# Patient Record
Sex: Male | Born: 1965 | Race: Black or African American | Hispanic: No | Marital: Single | Smoking: Never smoker
Health system: Southern US, Community
[De-identification: ages and names within clinical notes are randomized; demographics above are authoritative.]

## PROBLEM LIST (undated history)

## (undated) DIAGNOSIS — S43429A Sprain of unspecified rotator cuff capsule, initial encounter: Secondary | ICD-10-CM

## (undated) DIAGNOSIS — E119 Type 2 diabetes mellitus without complications: Secondary | ICD-10-CM

## (undated) DIAGNOSIS — I10 Essential (primary) hypertension: Secondary | ICD-10-CM

## (undated) HISTORY — DX: Type 2 diabetes mellitus without complications: E11.9

---

## 1999-02-23 ENCOUNTER — Encounter: Payer: Self-pay | Admitting: Emergency Medicine

## 1999-02-23 ENCOUNTER — Emergency Department (HOSPITAL_COMMUNITY): Admission: EM | Admit: 1999-02-23 | Discharge: 1999-02-23 | Payer: Self-pay | Admitting: Emergency Medicine

## 1999-04-02 ENCOUNTER — Ambulatory Visit (HOSPITAL_COMMUNITY): Admission: RE | Admit: 1999-04-02 | Discharge: 1999-04-02 | Payer: Self-pay | Admitting: Family Medicine

## 1999-04-02 ENCOUNTER — Encounter: Payer: Self-pay | Admitting: Family Medicine

## 2006-11-20 DIAGNOSIS — E669 Obesity, unspecified: Secondary | ICD-10-CM | POA: Insufficient documentation

## 2010-11-20 DIAGNOSIS — R079 Chest pain, unspecified: Secondary | ICD-10-CM

## 2010-11-21 DIAGNOSIS — R079 Chest pain, unspecified: Secondary | ICD-10-CM

## 2010-11-21 DIAGNOSIS — R9430 Abnormal result of cardiovascular function study, unspecified: Secondary | ICD-10-CM

## 2010-12-15 ENCOUNTER — Encounter: Payer: Self-pay | Admitting: Cardiovascular Disease

## 2010-12-29 ENCOUNTER — Encounter: Payer: Self-pay | Admitting: Cardiovascular Disease

## 2011-04-20 ENCOUNTER — Emergency Department (HOSPITAL_COMMUNITY)
Admission: EM | Admit: 2011-04-20 | Discharge: 2011-04-20 | Disposition: A | Discharge status: Home / Self Care | Payer: Self-pay | Attending: Emergency Medicine | Admitting: Emergency Medicine

## 2011-04-20 ENCOUNTER — Encounter: Payer: Self-pay | Admitting: Emergency Medicine

## 2011-04-20 DIAGNOSIS — W261XXA Contact with sword or dagger, initial encounter: Secondary | ICD-10-CM | POA: Insufficient documentation

## 2011-04-20 DIAGNOSIS — M79609 Pain in unspecified limb: Secondary | ICD-10-CM | POA: Insufficient documentation

## 2011-04-20 DIAGNOSIS — S61209A Unspecified open wound of unspecified finger without damage to nail, initial encounter: Secondary | ICD-10-CM | POA: Insufficient documentation

## 2011-04-20 DIAGNOSIS — S61218A Laceration without foreign body of other finger without damage to nail, initial encounter: Secondary | ICD-10-CM

## 2011-04-20 DIAGNOSIS — W260XXA Contact with knife, initial encounter: Secondary | ICD-10-CM | POA: Insufficient documentation

## 2011-04-20 HISTORY — DX: Essential (primary) hypertension: I10

## 2011-04-20 MED ORDER — IBUPROFEN 800 MG PO TABS: 800.0000 mg | ORAL_TABLET | Freq: Three times a day (TID) | ORAL | Status: AC

## 2011-04-20 MED ORDER — HYDROCODONE-ACETAMINOPHEN 5-325 MG PO TABS
1.0000 | ORAL_TABLET | Freq: Once | ORAL | Status: AC
  Administered 2011-04-20: 1 via ORAL
  Filled 2011-04-20: qty 1

## 2011-04-20 MED ORDER — TETANUS-DIPHTH-ACELL PERTUSSIS 5-2.5-18.5 LF-MCG/0.5 IM SUSP
0.5000 mL | Freq: Once | INTRAMUSCULAR | Status: AC
  Administered 2011-04-20: 0.5 mL via INTRAMUSCULAR

## 2011-04-20 NOTE — ED Provider Notes (Signed)
History     CSN: [id number]  Arrival date & time 04/20/11  7829   First MD Initiated Contact with Patient 04/20/11 1905      Chief Complaint  Patient presents with  . Laceration    (Consider location/radiation/quality/duration/timing/severity/associated sxs/prior treatment) HPI Comments: Pt was cutting onions and cut finger.  Patient is a 46 y.o. male presenting with skin laceration. The history is provided by the patient. No language interpreter was used.  Laceration  The incident occurred 1 to 2 hours ago. Pain location: L index finger. Size: 1.5. The laceration mechanism was a a clean knife. The pain is at a severity of 6/10. The pain has been constant since onset. He reports no foreign bodies present. His tetanus status is out of date.    Past Medical History  Diagnosis Date  . Hypertension     History reviewed. No pertinent past surgical history.  No family history on file.  History  Substance Use Topics  . Smoking status: Never Smoker   . Smokeless tobacco: Never Used  . Alcohol Use: No      Review of Systems  Skin: Positive for wound.  All other systems reviewed and are negative.    Allergies  Penicillins  Home Medications  No current outpatient prescriptions on file.  BP 153/108  Pulse 82  Temp(Src) 98.5 F (36.9 C) (Oral)  Resp 18  Ht 5\' 11"  (1.803 m)  Wt 315 lb (142.883 kg)  BMI 43.93 kg/m2  SpO2 99%  Physical Exam  Nursing note and vitals reviewed. Constitutional: He is oriented to person, place, and time. He appears well-developed and well-nourished.  HENT:  Head: Normocephalic and atraumatic.  Eyes: EOM are normal.  Neck: Normal range of motion.  Cardiovascular: Normal rate, regular rhythm, normal heart sounds and intact distal pulses.   Pulmonary/Chest: Effort normal and breath sounds normal. No respiratory distress.  Abdominal: Soft. He exhibits no distension. There is no tenderness.  Musculoskeletal: Normal range of motion. He  exhibits tenderness.       Left hand: He exhibits tenderness and laceration. He exhibits normal range of motion, no bony tenderness, normal capillary refill and no deformity. normal sensation noted. Normal strength noted.       Hands: Neurological: He is alert and oriented to person, place, and time.  Skin: Skin is warm and dry.  Psychiatric: He has a normal mood and affect. Judgment normal.    ED Course  LACERATION REPAIR Date/Time: 04/20/2011 7:35 PM Performed by: Worthy Rancher Authorized by: Worthy Rancher Consent: Verbal consent obtained. Written consent not obtained. Risks and benefits: risks, benefits and alternatives were discussed Consent given by: patient Patient understanding: patient states understanding of the procedure being performed Site marked: the operative site was not marked Imaging studies: imaging studies not available Patient identity confirmed: verbally with patient Time out: Immediately prior to procedure a "time out" was called to verify the correct patient, procedure, equipment, support staff and site/side marked as required. Body area: upper extremity Location details: left index finger Laceration length: 1.5 cm Foreign bodies: no foreign bodies Tendon involvement: none Nerve involvement: none Vascular damage: no Anesthesia method: none. Patient sedated: no Irrigation solution: tap water Irrigation method: tap Amount of cleaning: standard (drenched with sure clens) Skin closure: glue Approximation: close Approximation difficulty: simple Patient tolerance: Patient tolerated the procedure well with no immediate complications.   (including critical care time)  Labs Reviewed - No data to display No results found.   No diagnosis  found.    MDM          Worthy Rancher, PA 04/20/11 1943

## 2011-04-20 NOTE — ED Notes (Signed)
Patient states was cutting an onion and sliced his left index finger.

## 2011-04-20 NOTE — ED Provider Notes (Signed)
Medical screening examination/treatment/procedure(s) were performed by non-physician practitioner and as supervising physician I was immediately available for consultation/collaboration.  Momin Misko S. Engelbert Sevin, MD 04/20/11 2327 

## 2011-09-06 ENCOUNTER — Encounter (HOSPITAL_COMMUNITY): Payer: Self-pay | Admitting: *Deleted

## 2011-09-06 ENCOUNTER — Emergency Department (HOSPITAL_COMMUNITY)
Admission: EM | Admit: 2011-09-06 | Discharge: 2011-09-06 | Disposition: A | Discharge status: Home / Self Care | Payer: Self-pay | Attending: Emergency Medicine | Admitting: Emergency Medicine

## 2011-09-06 DIAGNOSIS — H02826 Cysts of left eye, unspecified eyelid: Secondary | ICD-10-CM

## 2011-09-06 DIAGNOSIS — I10 Essential (primary) hypertension: Secondary | ICD-10-CM | POA: Insufficient documentation

## 2011-09-06 DIAGNOSIS — H5710 Ocular pain, unspecified eye: Secondary | ICD-10-CM | POA: Insufficient documentation

## 2011-09-06 DIAGNOSIS — H02829 Cysts of unspecified eye, unspecified eyelid: Secondary | ICD-10-CM | POA: Insufficient documentation

## 2011-09-06 MED ORDER — ERYTHROMYCIN 5 MG/GM OP OINT
TOPICAL_OINTMENT | Freq: Once | OPHTHALMIC | Status: AC
  Administered 2011-09-06: 21:00:00 via OPHTHALMIC

## 2011-09-06 NOTE — ED Notes (Signed)
Pt presents with left eye pain x 2 years. Pain has increased over the past 2 days. Pt has a sty like papule on upper left eye lid. Pt reports had seen MD in [place] and was referred to opthalmologist. To which he has not followed through. Pt has noted swelling to upper left eye lid.,

## 2011-09-06 NOTE — ED Provider Notes (Signed)
History     CSN: [id number]  Arrival date & time 09/06/11  1941   First MD Initiated Contact with Patient 09/06/11 2006      Chief Complaint  Patient presents with  . Eye Pain    (Consider location/radiation/quality/duration/timing/severity/associated sxs/prior treatment) HPI Comments: [place] presents with left upper eyelid swelling and pain which has worsened over the past 4 days.  He describes a chronic "bump" on his left lid for the past 2 years which occasionally gets more swollen and tender,  Then improves.  He was encouraged by his former doctor in [place] to get seen by an ophthalmologist for definitive treatment,  But patient has not followed through.  He has since moved to [place].  He denies fevers and chills,  Denies visual changes,  No pain with movement of eyes.  He has tenderness with palpation of the eyelid.    The history is provided by the patient.    Past Medical History  Diagnosis Date  . Hypertension     History reviewed. No pertinent past surgical history.  History reviewed. No pertinent family history.  History  Substance Use Topics  . Smoking status: Never Smoker   . Smokeless tobacco: Never Used  . Alcohol Use: No      Review of Systems  Constitutional: Negative for fever.  HENT: Negative for ear pain, congestion, sore throat and neck pain.   Eyes: Positive for pain. Negative for photophobia, discharge and visual disturbance.  Gastrointestinal: Negative for nausea and abdominal pain.  Musculoskeletal: Negative for arthralgias.  Skin: Negative.  Negative for rash and wound.  Neurological: Negative for weakness, light-headedness and headaches.  Hematological: Negative.   Psychiatric/Behavioral: Negative.     Allergies  Penicillins  Home Medications   Current Outpatient Rx  Name Route Sig Dispense Refill  . ACETAMINOPHEN 500 MG PO TABS Oral Take 1,000 mg by mouth as needed. For pain    . AMLODIPINE BESYLATE 5 MG PO TABS Oral Take 5  mg by mouth daily.      . ASPIRIN EC 81 MG PO TBEC Oral Take 81 mg by mouth daily.    Marland Kitchen CARVEDILOL 12.5 MG PO TABS Oral Take 12.5 mg by mouth 2 (two) times daily.      Marland Kitchen KETOTIFEN FUMARATE 0.025 % OP SOLN Both Eyes Place 1 drop into both eyes 2 (two) times daily.    Marland Kitchen LISINOPRIL-HYDROCHLOROTHIAZIDE 20-12.5 MG PO TABS Oral Take 1 tablet by mouth daily.      . ADULT MULTIVITAMIN W/MINERALS CH Oral Take 1 tablet by mouth daily.        BP 169/106  Pulse 82  Temp(Src) 98.1 F (36.7 C) (Oral)  Resp 16  Ht 6' (1.829 m)  Wt 315 lb (142.883 kg)  BMI 42.72 kg/m2  SpO2 99%  Physical Exam  Constitutional: He appears well-developed and well-nourished. No distress.  HENT:  Head: Normocephalic.  Eyes: Conjunctivae and EOM are normal. Pupils are equal, round, and reactive to light. Right eye exhibits no discharge. Left eye exhibits no discharge.       Small punctum left upper eyelid margin with scant white center.  Upper lid with mild erythema and edema on outer 1/3.   Neck: Neck supple.  Cardiovascular: Normal rate.   Pulmonary/Chest: Effort normal. He has no wheezes.  Musculoskeletal: Normal range of motion. He exhibits no edema.    ED Course  Procedures (including critical care time)  Labs Reviewed - No data to display No results  found.   No diagnosis found.    MDM  Lesion which has been waxing and waning for the past 2 years without resolution greatly suggests sebaceous cyst rather than acute stye.  Patient has been instructed to wash his lid with baby shampoo twice daily,  Given erythromycin ophthalmic ointment to apply to the upper lid tid.  Warm compresses.  Referral to Dr Lita Mains for definitive tx.        Burgess Amor, [place] 09/07/11 [phone]

## 2011-09-06 NOTE — Discharge Instructions (Signed)
Apply the antibiotic ointment given three times daily after washing your eyelid with baby shampoo as discussed.  Use warm water wash cloth compresses for 10 minutes 4-5 times daily.  Call Dr. Lita Mains for definitive treatment of this eyelid problem,  As discussed.

## 2011-09-06 NOTE — ED Notes (Signed)
Pain lt eye with swelling of upper lid.

## 2011-09-07 NOTE — ED Provider Notes (Signed)
Medical screening examination/treatment/procedure(s) were performed by non-physician practitioner and as supervising physician I was immediately available for consultation/collaboration.   Matilde Pottenger, MD 09/07/11 1953 

## 2011-11-22 ENCOUNTER — Emergency Department (HOSPITAL_COMMUNITY): Payer: Self-pay

## 2011-11-22 ENCOUNTER — Emergency Department (HOSPITAL_COMMUNITY)
Admission: EM | Admit: 2011-11-22 | Discharge: 2011-11-22 | Disposition: A | Discharge status: Home / Self Care | Payer: Self-pay | Attending: Emergency Medicine | Admitting: Emergency Medicine

## 2011-11-22 ENCOUNTER — Encounter (HOSPITAL_COMMUNITY): Payer: Self-pay | Admitting: Emergency Medicine

## 2011-11-22 DIAGNOSIS — IMO0002 Reserved for concepts with insufficient information to code with codable children: Secondary | ICD-10-CM | POA: Insufficient documentation

## 2011-11-22 DIAGNOSIS — Z79899 Other long term (current) drug therapy: Secondary | ICD-10-CM | POA: Insufficient documentation

## 2011-11-22 DIAGNOSIS — Z880 Allergy status to penicillin: Secondary | ICD-10-CM | POA: Insufficient documentation

## 2011-11-22 DIAGNOSIS — S46912A Strain of unspecified muscle, fascia and tendon at shoulder and upper arm level, left arm, initial encounter: Secondary | ICD-10-CM

## 2011-11-22 DIAGNOSIS — I10 Essential (primary) hypertension: Secondary | ICD-10-CM | POA: Insufficient documentation

## 2011-11-22 HISTORY — DX: Sprain of unspecified rotator cuff capsule, initial encounter: S43.429A

## 2011-11-22 MED ORDER — HYDROCODONE-ACETAMINOPHEN 5-325 MG PO TABS
1.0000 | ORAL_TABLET | Freq: Once | ORAL | Status: AC
  Administered 2011-11-22: 1 via ORAL
  Filled 2011-11-22: qty 1

## 2011-11-22 MED ORDER — HYDROCODONE-ACETAMINOPHEN 5-325 MG PO TABS: 1.0000 | ORAL_TABLET | Freq: Four times a day (QID) | ORAL | Status: AC | PRN

## 2011-11-22 MED ORDER — IBUPROFEN 800 MG PO TABS
800.0000 mg | ORAL_TABLET | Freq: Once | ORAL | Status: AC
  Administered 2011-11-22: 800 mg via ORAL
  Filled 2011-11-22: qty 1

## 2011-11-22 NOTE — ED Provider Notes (Signed)
History     CSN: [id number]  Arrival date & time 11/22/11  1030   First MD Initiated Contact with Patient 11/22/11 1050      Chief Complaint  Patient presents with  . Shoulder Pain    (Consider location/radiation/quality/duration/timing/severity/associated sxs/prior treatment) HPI Comments: Pt lifted a recliner by himself and put it in a truck yesterday.  Heard a "pop" and has had pain since then.  R hand dominant.  Pain radiates into L arm.  Patient is a 46 y.o. male presenting with shoulder pain. The history is provided by the patient. No language interpreter was used.  Shoulder Pain This is a new problem. The current episode started yesterday. The problem occurs constantly. The problem has been unchanged. Associated symptoms include weakness. Pertinent negatives include no numbness. Exacerbated by: shoulder movement. He has tried nothing for the symptoms.    Past Medical History  Diagnosis Date  . Hypertension   . Rotator cuff (capsule) sprain     History reviewed. No pertinent past surgical history.  History reviewed. No pertinent family history.  History  Substance Use Topics  . Smoking status: Never Smoker   . Smokeless tobacco: Never Used  . Alcohol Use: No      Review of Systems  Musculoskeletal:       Shoulder injury  Neurological: Positive for weakness. Negative for numbness.  All other systems reviewed and are negative.    Allergies  Penicillins  Home Medications   Current Outpatient Rx  Name Route Sig Dispense Refill  . ACETAMINOPHEN 500 MG PO TABS Oral Take 1,000-1,500 mg by mouth as needed. For pain    . AMLODIPINE BESYLATE 5 MG PO TABS Oral Take 5 mg by mouth daily.      . ASPIRIN EC 81 MG PO TBEC Oral Take 81 mg by mouth daily.    Marland Kitchen CARVEDILOL 12.5 MG PO TABS Oral Take 12.5 mg by mouth daily.     Marland Kitchen LISINOPRIL-HYDROCHLOROTHIAZIDE 20-12.5 MG PO TABS Oral Take 1 tablet by mouth daily.      . ADULT MULTIVITAMIN W/MINERALS CH Oral Take 1 tablet  by mouth daily.      Marland Kitchen HYDROCODONE-ACETAMINOPHEN 5-325 MG PO TABS Oral Take 1 tablet by mouth every 6 (six) hours as needed for pain. 20 tablet 0    BP 166/92  Pulse 58  Temp 98.6 F (37 C)  Resp 17  SpO2 100%  Physical Exam  Nursing note and vitals reviewed. Constitutional: He is oriented to person, place, and time. He appears well-developed and well-nourished.  HENT:  Head: Normocephalic and atraumatic.  Eyes: EOM are normal.  Neck: Normal range of motion.  Cardiovascular: Normal rate, regular rhythm, normal heart sounds and intact distal pulses.   Pulmonary/Chest: Effort normal and breath sounds normal. No respiratory distress.  Abdominal: Soft. He exhibits no distension. There is no tenderness.  Musculoskeletal: He exhibits tenderness.       Left shoulder: He exhibits decreased range of motion, tenderness and pain. He exhibits no swelling, no crepitus, no deformity, no laceration and normal pulse.       Arms: Neurological: He is alert and oriented to person, place, and time.  Skin: Skin is warm and dry.  Psychiatric: He has a normal mood and affect. Judgment normal.    ED Course  Procedures (including critical care time)  Labs Reviewed - No data to display Dg Shoulder Left  11/22/2011  *RADIOLOGY REPORT*  Clinical Data: Shoulder pain  LEFT SHOULDER - 2+ VIEW  Comparison: None.  Findings: No acute fracture or dislocation is seen.  No soft tissue abnormality is noted.  IMPRESSION: No acute abnormality.  Original Report Authenticated By: Phillips Odor, M.D.     1. Left shoulder strain       MDM  No fx.  Ice, sling and ibuprofen rx-hydrocodone F/u with dr. Stana Bunting, PA 11/22/11 1153

## 2011-11-22 NOTE — ED Notes (Addendum)
Pt heard a pop to L shoulder lyesterday and pain since. Pt guarding are. ROM  limited due to pain. Was lifting at time of injury.

## 2011-11-23 NOTE — ED Provider Notes (Signed)
Medical screening examination/treatment/procedure(s) were performed by non-physician practitioner and as supervising physician I was immediately available for consultation/collaboration.  Donnetta Hutching, MD 11/23/11 [phone]

## 2012-10-20 ENCOUNTER — Ambulatory Visit: Payer: Self-pay | Admitting: Family Medicine

## 2012-10-22 ENCOUNTER — Ambulatory Visit (INDEPENDENT_AMBULATORY_CARE_PROVIDER_SITE_OTHER): Payer: Medicaid Other | Admitting: Family Medicine

## 2012-10-22 ENCOUNTER — Telehealth: Payer: Self-pay | Admitting: Family Medicine

## 2012-10-22 ENCOUNTER — Encounter: Payer: Self-pay | Admitting: Family Medicine

## 2012-10-22 VITALS — BP 158/93 | HR 79 | Temp 98.7°F | Ht 71.0 in | Wt 320.6 lb

## 2012-10-22 DIAGNOSIS — E119 Type 2 diabetes mellitus without complications: Secondary | ICD-10-CM | POA: Insufficient documentation

## 2012-10-22 DIAGNOSIS — G4710 Hypersomnia, unspecified: Secondary | ICD-10-CM

## 2012-10-22 DIAGNOSIS — E291 Testicular hypofunction: Secondary | ICD-10-CM

## 2012-10-22 DIAGNOSIS — I10 Essential (primary) hypertension: Secondary | ICD-10-CM | POA: Insufficient documentation

## 2012-10-22 DIAGNOSIS — G4733 Obstructive sleep apnea (adult) (pediatric): Secondary | ICD-10-CM | POA: Insufficient documentation

## 2012-10-22 DIAGNOSIS — R635 Abnormal weight gain: Secondary | ICD-10-CM

## 2012-10-22 DIAGNOSIS — N529 Male erectile dysfunction, unspecified: Secondary | ICD-10-CM

## 2012-10-22 LAB — POCT CBC
Granulocyte percent: 71 %G (ref 37–80)
HCT, POC: 42.9 % — AB (ref 43.5–53.7)
Hemoglobin: 15.2 g/dL (ref 14.1–18.1)
Lymph, poc: 1.6 (ref 0.6–3.4)
MCH, POC: 30.1 pg (ref 27–31.2)
MCHC: 35.4 g/dL (ref 31.8–35.4)
MCV: 84.9 fL (ref 80–97)
MPV: 7.7 fL (ref 0–99.8)
POC Granulocyte: 4.8 (ref 2–6.9)
POC LYMPH PERCENT: 24.4 %L (ref 10–50)
Platelet Count, POC: 235 10*3/uL (ref 142–424)
RBC: 5.1 M/uL (ref 4.69–6.13)
RDW, POC: 12.6 %
WBC: 6.7 10*3/uL (ref 4.6–10.2)

## 2012-10-22 MED ORDER — SILDENAFIL CITRATE 100 MG PO TABS: 50.0000 mg | ORAL_TABLET | Freq: Every day | ORAL | Status: DC | PRN

## 2012-10-22 MED ORDER — TESTOSTERONE CYPIONATE 200 MG/ML IM SOLN: 200.0000 mg | Freq: Once | INTRAMUSCULAR | Status: DC

## 2012-10-22 NOTE — Progress Notes (Signed)
  Subjective:    Patient ID: [place], male    DOB: 1965-12-29, 47 y.o.   MRN: [id number]  HPI This 47 y.o. male presents for evaluation of establishment and diabetes.  He has low testosterone  And he was seen at health dept.  He has hx of htn, obesity, hypogonadism, snoring and sleep apnea, And fatigue.  He has not had sleep study but has been told by his girlfriend that he quits breathing and that His snoring is really bad and they sleep apart.  He c/o chronic fatigue.  He c/o ED.  He has hx of DM and has been Started on metformin and his fsbs's are better.  He had labs a few months ago at health dept.   Review of Systems C/o fatigue, snoring, and ED.  No chest pain, SOB, HA, dizziness, vision change, N/V, diarrhea, constipation, dysuria, urinary urgency or frequency, myalgias, arthralgias or rash.     Objective:   Physical Exam Vital signs noted  Well developed well nourished obese male.  HEENT - Head atraumatic Normocephalic                Eyes - PERRLA, Conjuctiva - clear Sclera- Clear EOMI                Ears - EAC's Wnl TM's Wnl Gross Hearing WNL                Nose - Nares patent                 Throat - oropharanx wnl Respiratory - Lungs CTA bilateral Cardiac - RRR S1 and S2 without murmur GI - Abdomen soft Nontender and bowel sounds active x 4 Extremities - No edema. Neuro - Grossly intact.       Assessment & Plan:  Diabetes - Plan: testosterone cypionate (DEPOTESTOTERONE CYPIONATE) 200 MG/ML injection, POCT CBC, COMPLETE METABOLIC PANEL WITH GFR, Testosterone, Total & Free Direct, PSA.  Continue metformin 500mg  po bid and then get hgbaic in 3 months with follow up.  Hypogonadism male - Plan: testosterone cypionate (DEPOTESTOTERONE CYPIONATE) 200 MG/ML injection, POCT CBC, COMPLETE METABOLIC PANEL WITH GFR, Testosterone, Total & Free Direct, PSA.  Get old chart and stat testosterone monthly injections.  Hypersomnia with sleep apnea, unspecified - Plan:  Nocturnal polysomnography (NPSG)  Erectile dysfunction - Plan: sildenafil (VIAGRA) 100 MG tablet, cut in half and use one half to one prn.  Hypertension - Continue current antihypertensive medicine and discussed that getting OSAS tx will help with management of hypertension.  Obesity - Discussed that he needs to start aggressive weight loss program.

## 2012-10-22 NOTE — Patient Instructions (Signed)

## 2012-10-22 NOTE — Telephone Encounter (Signed)
[employer]CD will not cover Viagra is there something else we can call in ar something he can try over the counter

## 2012-10-23 LAB — COMPLETE METABOLIC PANEL WITH GFR
ALT: 37 U/L (ref 0–53)
AST: 32 U/L (ref 0–37)
Albumin: 4.4 g/dL (ref 3.5–5.2)
Alkaline Phosphatase: 57 U/L (ref 39–117)
BUN: 17 mg/dL (ref 6–23)
CO2: 28 mEq/L (ref 19–32)
Calcium: 9.3 mg/dL (ref 8.4–10.5)
Chloride: 100 mEq/L (ref 96–112)
Creat: 1.04 mg/dL (ref 0.50–1.35)
GFR, Est African American: >89 mL/min
GFR, Est Non African American: 86 mL/min
Glucose, Bld: 111 mg/dL — ABNORMAL HIGH (ref 70–99)
Potassium: 3.8 mEq/L (ref 3.5–5.3)
Sodium: 140 mEq/L (ref 135–145)
Total Bilirubin: 0.6 mg/dL (ref 0.3–1.2)
Total Protein: 7.3 g/dL (ref 6.0–8.3)

## 2012-10-23 LAB — TESTOSTERONE, TOTAL AND FREE DIRECT MEASURE
Free Testosterone, Direct: 6.8 pg/mL (ref 3.8–34.2)
Testosterone: 383 ng/dL (ref 300–890)

## 2012-10-23 LAB — PSA: PSA: 0.25 ng/mL (ref <=4.00)

## 2012-10-23 NOTE — Telephone Encounter (Signed)
Defer to lab result note that addresses this concern.

## 2012-10-23 NOTE — Telephone Encounter (Signed)
Left a message yesterday. States that medicaid will not cover viagra and wants to know if something different can be called in or if there is something OTC he can try

## 2012-10-24 NOTE — Telephone Encounter (Signed)
He wants to try the cialis can you please send to [facility] in [place]

## 2012-11-14 ENCOUNTER — Ambulatory Visit: Payer: Medicaid Other | Attending: Family Medicine

## 2012-11-14 DIAGNOSIS — G4730 Sleep apnea, unspecified: Secondary | ICD-10-CM

## 2012-11-14 DIAGNOSIS — G4733 Obstructive sleep apnea (adult) (pediatric): Secondary | ICD-10-CM | POA: Insufficient documentation

## 2012-11-17 NOTE — Procedures (Signed)
NAME:  [place], [place] NO.:  [id number]  MEDICAL RECORD NO.:  [id number]          PATIENT TYPE:  OUT  LOCATION:  SLEEP LAB                     FACILITY:  [facility]  PHYSICIAN:  Garreth Burnsworth A. Gerilyn Pilgrim, M.D. DATE OF BIRTH:  1965/06/14  DATE OF STUDY:  11/14/2012                           NOCTURNAL POLYSOMNOGRAM  REFERRING PHYSICIAN:  Anselm Pancoast [place]  INDICATION FOR STUDY:  A 47 year old who presents with fatigue and snoring.  The study is being done to evaluate for obstructive sleep apnea syndrome.  EPWORTH SLEEPINESS SCORE:  2.  MEDICATIONS:  Viagra, metformin, prednisone, Hyzaar, hydrocodone, and Norvasc.   ARCHITECTURAL SUMMARY:  The total recording time is 390 minutes, sleep efficiency 73%, sleep latency 74 minutes, REM latency 43 minutes.  Stage N1 8%, N2 70%, N3 3%, and REM sleep 19%.  RESPIRATORY SUMMARY:  Baseline oxygen saturation is 95, lowest saturation 87 during REM and non-REM sleep.  Diagnostic AHI is 21.  The patient had more events during REM sleep with the REM index of 28.  LIMB MOVEMENT SUMMARY:  PLM index is 0.  ELECTROCARDIOGRAM SUMMARY:  Average heart rate is 77, with infrequent PVCs observed.  IMPRESSION:  Moderate obstructive sleep apnea syndrome worse during REM sleep.  RECOMMENDATION:  Formal CPAP titration recording.  Thanks for this referral.   Traycen Goyer A. Gerilyn Pilgrim, M.D.    KAD/MEDQ  D:  11/17/2012 10:20:32  T:  11/17/2012 10:42:41  Job:  [id number]

## 2012-11-24 ENCOUNTER — Other Ambulatory Visit: Payer: Self-pay | Admitting: Family Medicine

## 2012-11-25 ENCOUNTER — Other Ambulatory Visit (HOSPITAL_COMMUNITY): Payer: Self-pay | Admitting: Orthopaedic Surgery

## 2012-11-25 DIAGNOSIS — M25561 Pain in right knee: Secondary | ICD-10-CM

## 2012-11-27 ENCOUNTER — Ambulatory Visit (HOSPITAL_COMMUNITY)
Admission: RE | Admit: 2012-11-27 | Discharge: 2012-11-27 | Disposition: A | Discharge status: Home / Self Care | Payer: Medicaid Other | Source: Ambulatory Visit | Attending: Orthopaedic Surgery | Admitting: Orthopaedic Surgery

## 2012-11-27 DIAGNOSIS — M25561 Pain in right knee: Secondary | ICD-10-CM

## 2012-11-27 DIAGNOSIS — M2240 Chondromalacia patellae, unspecified knee: Secondary | ICD-10-CM | POA: Insufficient documentation

## 2012-11-27 DIAGNOSIS — M25569 Pain in unspecified knee: Secondary | ICD-10-CM | POA: Insufficient documentation

## 2012-11-28 ENCOUNTER — Encounter: Payer: Self-pay | Admitting: Family Medicine

## 2012-12-10 ENCOUNTER — Other Ambulatory Visit: Payer: Self-pay | Admitting: Family Medicine

## 2012-12-10 MED ORDER — TADALAFIL 20 MG PO TABS: 10.0000 mg | ORAL_TABLET | ORAL | Status: DC | PRN

## 2013-01-01 ENCOUNTER — Other Ambulatory Visit (HOSPITAL_COMMUNITY): Payer: Self-pay

## 2013-01-01 DIAGNOSIS — G4730 Sleep apnea, unspecified: Secondary | ICD-10-CM

## 2013-02-05 ENCOUNTER — Ambulatory Visit: Payer: Medicaid Other | Attending: Neurology | Admitting: Sleep Medicine

## 2013-02-05 DIAGNOSIS — G4733 Obstructive sleep apnea (adult) (pediatric): Secondary | ICD-10-CM | POA: Insufficient documentation

## 2013-02-11 NOTE — Procedures (Signed)
[facility] Kery Haltiwanger A. Gerilyn Pilgrim, MD     [email or web address]        NAME:  [place], [place] NO.:  [id number]  MEDICAL RECORD NO.:  [id number]          PATIENT TYPE:  OUT  LOCATION:  SLEEP LAB                     FACILITY:  [facility]  PHYSICIAN:  De Jaworski A. Gerilyn Pilgrim, M.D. DATE OF BIRTH:  03-Aug-1965  DATE OF STUDY:  02/05/2013                           NOCTURNAL POLYSOMNOGRAM  REFERRING PHYSICIAN:  Tyhesha Dutson A. Gerilyn Pilgrim, M.D.   INDICATION:  The patient is a 47 years old who has a known history of obstructive sleep apnea syndrome documented with previous nocturnal polysomnography.   MEDICATIONS:  Amlodipine, metformin, Hyzaar, lovastatin, prednisone, hydrocodone.  EPWORTH SLEEPINESS SCALE:  2.  BMI:  44.  ARCHITECTURAL SUMMARY:  This is a titration recording study.  The total recording time is 368 minutes.  Sleep efficiency 90%, sleep latency 30 minutes.  REM latency 43 minutes.  Stage N1 3%, N2 56%, N3 0%, and REM sleep 42%.  RESPIRATORY SUMMARY:  Baseline oxygen saturation is 96, lowest saturation is 85 during REM sleep.  The patient was placed on positive pressure starting at 4 and increased to 15, did well any where from 13- 15.  The patient was also noted to have sleep-induced central apneas indicative of periodic breathing.  LIMB MOVEMENT SUMMARY:  PLM index 0.  ELECTROCARDIOGRAM SUMMARY:  Average heart rate is 80 with no significant dysrhythmias observed.  IMPRESSION:  Obstructive sleep apnea syndrome which responds well to a CPAP of 13.    Emsley Custer A. Gerilyn Pilgrim, M.D.    KAD/MEDQ  D:  02/11/2013 10:52:34  T:  02/11/2013 11:16:38  Job:  [id number]

## 2013-02-19 ENCOUNTER — Emergency Department (HOSPITAL_COMMUNITY)
Admission: EM | Admit: 2013-02-19 | Discharge: 2013-02-19 | Disposition: A | Discharge status: Home / Self Care | Payer: Medicaid Other | Attending: Emergency Medicine | Admitting: Emergency Medicine

## 2013-02-19 ENCOUNTER — Encounter (HOSPITAL_COMMUNITY): Payer: Self-pay | Admitting: Emergency Medicine

## 2013-02-19 ENCOUNTER — Other Ambulatory Visit: Payer: Self-pay

## 2013-02-19 DIAGNOSIS — M25551 Pain in right hip: Secondary | ICD-10-CM

## 2013-02-19 DIAGNOSIS — Z7982 Long term (current) use of aspirin: Secondary | ICD-10-CM | POA: Insufficient documentation

## 2013-02-19 DIAGNOSIS — M545 Low back pain, unspecified: Secondary | ICD-10-CM | POA: Insufficient documentation

## 2013-02-19 DIAGNOSIS — I10 Essential (primary) hypertension: Secondary | ICD-10-CM | POA: Insufficient documentation

## 2013-02-19 DIAGNOSIS — M25559 Pain in unspecified hip: Secondary | ICD-10-CM | POA: Insufficient documentation

## 2013-02-19 DIAGNOSIS — Z880 Allergy status to penicillin: Secondary | ICD-10-CM | POA: Insufficient documentation

## 2013-02-19 DIAGNOSIS — E119 Type 2 diabetes mellitus without complications: Secondary | ICD-10-CM | POA: Insufficient documentation

## 2013-02-19 DIAGNOSIS — Z87828 Personal history of other (healed) physical injury and trauma: Secondary | ICD-10-CM | POA: Insufficient documentation

## 2013-02-19 DIAGNOSIS — Z79899 Other long term (current) drug therapy: Secondary | ICD-10-CM | POA: Insufficient documentation

## 2013-02-19 MED ORDER — HYDROCODONE-ACETAMINOPHEN 5-325 MG PO TABS
2.0000 | ORAL_TABLET | Freq: Once | ORAL | Status: AC
  Administered 2013-02-19: 2 via ORAL
  Filled 2013-02-19: qty 2

## 2013-02-19 MED ORDER — NAPROXEN 250 MG PO TABS
500.0000 mg | ORAL_TABLET | Freq: Once | ORAL | Status: AC
  Administered 2013-02-19: 500 mg via ORAL
  Filled 2013-02-19: qty 2

## 2013-02-19 MED ORDER — DIAZEPAM 5 MG PO TABS
10.0000 mg | ORAL_TABLET | Freq: Once | ORAL | Status: AC
  Administered 2013-02-19: 10 mg via ORAL
  Filled 2013-02-19: qty 2

## 2013-02-19 MED ORDER — METHOCARBAMOL 500 MG PO TABS: 500.0000 mg | ORAL_TABLET | Freq: Three times a day (TID) | ORAL | Status: DC

## 2013-02-19 NOTE — ED Provider Notes (Signed)
CSN: [id number]     Arrival date & time 02/19/13  1442 History   First MD Initiated Contact with Patient 02/19/13 1528     Chief Complaint  Patient presents with  . Hip Pain   (Consider location/radiation/quality/duration/timing/severity/associated sxs/prior Treatment) Patient is a 47 y.o. male presenting with hip pain. The history is provided by the patient.  Hip Pain This is a new problem. The current episode started in the past 7 days. The problem has been unchanged. Associated symptoms include arthralgias. Pertinent negatives include no abdominal pain, chest pain, coughing or neck pain. The symptoms are aggravated by standing and walking. He has tried NSAIDs for the symptoms. The treatment provided no relief.    Past Medical History  Diagnosis Date  . Hypertension   . Rotator cuff (capsule) sprain   . Diabetes mellitus without complication    History reviewed. No pertinent past surgical history. Family History  Problem Relation Age of Onset  . Hypertension Mother   . Hyperlipidemia Mother   . Cancer Father     throat   History  Substance Use Topics  . Smoking status: Never Smoker   . Smokeless tobacco: Never Used  . Alcohol Use: No    Review of Systems  Constitutional: Negative for activity change.       All ROS Neg except as noted in HPI  HENT: Negative for nosebleeds.   Eyes: Negative for photophobia and discharge.  Respiratory: Negative for cough, shortness of breath and wheezing.   Cardiovascular: Negative for chest pain and palpitations.  Gastrointestinal: Negative for abdominal pain and blood in stool.  Genitourinary: Negative for dysuria, frequency and hematuria.  Musculoskeletal: Positive for arthralgias. Negative for back pain and neck pain.  Skin: Negative.   Neurological: Negative for dizziness, seizures and speech difficulty.  Psychiatric/Behavioral: Negative for hallucinations and confusion.    Allergies  Penicillins  Home Medications   Current  Outpatient Rx  Name  Route  Sig  Dispense  Refill  . amLODipine (NORVASC) 10 MG tablet   Oral   Take 10 mg by mouth daily.         Marland Kitchen aspirin EC 81 MG tablet   Oral   Take 81 mg by mouth daily.         Marland Kitchen HYDROcodone-ibuprofen (VICOPROFEN) 7.5-200 MG per tablet   Oral   Take 1 tablet by mouth every 8 (eight) hours as needed for pain.         Marland Kitchen losartan-hydrochlorothiazide (HYZAAR) 100-25 MG per tablet   Oral   Take 1 tablet by mouth daily.         Marland Kitchen lovastatin (MEVACOR) 20 MG tablet   Oral   Take 20 mg by mouth at bedtime.         . metformin (FORTAMET) 500 MG (OSM) 24 hr tablet   Oral   Take 500 mg by mouth daily with breakfast.         . naproxen (NAPROSYN) 500 MG tablet   Oral   Take 500 mg by mouth 2 (two) times daily with a meal.         . sildenafil (VIAGRA) 100 MG tablet   Oral   Take 0.5-1 tablets (50-100 mg total) by mouth daily as needed for erectile dysfunction.   5 tablet   11   . tadalafil (CIALIS) 20 MG tablet   Oral   Take 0.5-1 tablets (10-20 mg total) by mouth every other day as needed for erectile dysfunction.  5 tablet   11   . testosterone cypionate (DEPOTESTOTERONE CYPIONATE) 200 MG/ML injection   Intramuscular   Inject 1 mL (200 mg total) into the muscle once.   1 mL   3    BP 165/104  Pulse 99  Temp(Src) 98.8 F (37.1 C) (Oral)  Resp 20  Ht 6' (1.829 m)  Wt 325 lb (147.419 kg)  BMI 44.07 kg/m2  SpO2 98% Physical Exam  Nursing note and vitals reviewed. Constitutional: He is oriented to person, place, and time. He appears well-developed and well-nourished.  Non-toxic appearance.  HENT:  Head: Normocephalic.  Right Ear: Tympanic membrane and external ear normal.  Left Ear: Tympanic membrane and external ear normal.  Eyes: EOM and lids are normal. Pupils are equal, round, and reactive to light.  Neck: Normal range of motion. Neck supple. Carotid bruit is not present.  Cardiovascular: Normal rate, regular rhythm, normal  heart sounds, intact distal pulses and normal pulses.   Pulmonary/Chest: Breath sounds normal. No respiratory distress.  Abdominal: Soft. Bowel sounds are normal. There is no tenderness. There is no guarding.  Musculoskeletal: Normal range of motion.  Mild low back pain to palpation. Hip and thigh on the right to movement and with ROM. Distal pulses and sensory wnl. Neg Homan's sign. No hot areas.   Lymphadenopathy:       Head (right side): No submandibular adenopathy present.       Head (left side): No submandibular adenopathy present.    He has no cervical adenopathy.  Neurological: He is alert and oriented to person, place, and time. He has normal strength. No cranial nerve deficit or sensory deficit.  Skin: Skin is warm and dry.  Psychiatric: He has a normal mood and affect. His speech is normal.    ED Course  Procedures (including critical care time) Labs Review Labs Reviewed - No data to display Imaging Review No results found.  EKG Interpretation   None       MDM  No diagnosis found. *I have reviewed nursing notes, vital signs, and all appropriate lab and imaging results for this patient.**  Pt has been diagnosed with degenerative changes of the right knee in the past. Patient now has advancing pain of the right hip. The patient is scheduled to have an MRI of the hip sone. Patient advised to speak with his physician about possible MRI of the lower back as well. It is of note that the patient has had some" disc disease changes" in the past.  Plan at this time is for the patient to add Robaxin to his hydrocodone 7.5 and his Naprosyn. Patient advised to rest his back is much as possible. Patient's work note given to return on Monday, November 10.  Kathie Dike, PA-C 02/19/13 [phone]

## 2013-02-19 NOTE — ED Notes (Signed)
Pain rt hip for 3 days.No injury

## 2013-02-20 NOTE — ED Provider Notes (Signed)
No att. providers found  Medical screening examination/treatment/procedure(s) were performed by non-physician practitioner and as supervising physician I was immediately available for consultation/collaboration.  EKG Interpretation   None        Shelda Jakes, MD 02/20/13 [phone]

## 2013-12-01 ENCOUNTER — Other Ambulatory Visit (HOSPITAL_COMMUNITY): Payer: Self-pay | Admitting: Orthopaedic Surgery

## 2013-12-01 DIAGNOSIS — M722 Plantar fascial fibromatosis: Secondary | ICD-10-CM

## 2013-12-04 ENCOUNTER — Ambulatory Visit (HOSPITAL_COMMUNITY)
Admission: RE | Admit: 2013-12-04 | Discharge: 2013-12-04 | Disposition: A | Discharge status: Home / Self Care | Payer: Medicaid Other | Source: Ambulatory Visit | Attending: Orthopaedic Surgery | Admitting: Orthopaedic Surgery

## 2013-12-04 DIAGNOSIS — M76829 Posterior tibial tendinitis, unspecified leg: Secondary | ICD-10-CM | POA: Insufficient documentation

## 2013-12-04 DIAGNOSIS — R937 Abnormal findings on diagnostic imaging of other parts of musculoskeletal system: Secondary | ICD-10-CM | POA: Diagnosis not present

## 2013-12-04 DIAGNOSIS — E119 Type 2 diabetes mellitus without complications: Secondary | ICD-10-CM | POA: Diagnosis not present

## 2013-12-04 DIAGNOSIS — M79609 Pain in unspecified limb: Secondary | ICD-10-CM | POA: Insufficient documentation

## 2013-12-04 DIAGNOSIS — M722 Plantar fascial fibromatosis: Secondary | ICD-10-CM

## 2013-12-24 ENCOUNTER — Ambulatory Visit: Payer: Medicaid Other | Admitting: Podiatry

## 2014-01-06 ENCOUNTER — Encounter: Payer: Self-pay | Admitting: Podiatry

## 2014-01-06 ENCOUNTER — Ambulatory Visit (INDEPENDENT_AMBULATORY_CARE_PROVIDER_SITE_OTHER): Payer: Medicaid Other | Admitting: Podiatry

## 2014-01-06 VITALS — BP 170/114 | HR 68 | Resp 16 | Ht 71.0 in | Wt 325.0 lb

## 2014-01-06 DIAGNOSIS — M76829 Posterior tibial tendinitis, unspecified leg: Secondary | ICD-10-CM

## 2014-01-06 DIAGNOSIS — M76821 Posterior tibial tendinitis, right leg: Secondary | ICD-10-CM

## 2014-01-06 DIAGNOSIS — M722 Plantar fascial fibromatosis: Secondary | ICD-10-CM | POA: Diagnosis not present

## 2014-01-06 MED ORDER — TRIAMCINOLONE ACETONIDE 10 MG/ML IJ SUSP
10.0000 mg | Freq: Once | INTRAMUSCULAR | Status: DC
Start: 2014-01-06 — End: 2020-03-17

## 2014-01-06 NOTE — Patient Instructions (Signed)

## 2014-01-06 NOTE — Progress Notes (Signed)
   Subjective:    Patient ID: David Brooks, male    DOB: Jul 09, 1965, 48 y.o.   MRN: [id number]  HPI Comments: "I have this pain in the center of the heel"  Patient c/o sharp, intense pain plantar heel right for about 1 year. He does have AM pain and after resting for long periods. Walking a lot aggravates the symptoms as well as after periods of working. 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 He has been seeing Dr. Hilda Lias, an orthopedist who has been treating him for the foot/knee/hip. Previously he was recommended arch supports, stretching, naproxen, hydrocodone, cortisone injections, and prednisone pack. While on the prednisone he had relief of symptoms but since discontinuing the symptoms have returned. He states that he had 2 injections in his hip which seem to help his foot. He has also had 1 injection into his foot a few months ago. He states he had some relief of symptoms after the injection and his foot. He denies any history, or injury to the area. He previously has had 2 MRIs of his foot. He was referred here for further treatment.  Foot Pain Associated symptoms include arthralgias and headaches.      Review of Systems  Musculoskeletal: Positive for arthralgias.  Neurological: Positive for headaches.  All other systems reviewed and are negative.      Objective:   Physical Exam AAO x3, NAD DP/PT pulses palpable 2/4 b/l. CRT < 3sec Protective sensation intact with Dorann Ou monofilament, vibratory sensation intact, Achilles tendon reflex intact. Negative Tinel sign. Tenderness to palpation over the plantar medial tubercle of the right foot at the insertion of the plantar fascia. No pain with lateral compression of the calcaneus or along the posterior aspect. Mild equinus. No pain along the course of the plantar fascia within the arch of the foot. Mild tenderness at that navicular tuberosity at the distal aspect of the posterior  tibial tendon. Able to perform single and double heel rise without difficulty. MMT 5/5, ROM.    Previous MRI from 12/04/13 reviewed which revealedno abscess, osteomyelitis, or well-defined ulceration along the hindfoot; potentially low-grade plantar fasciitis; mild distal tibialis posterior tendinopathy.       Assessment & Plan:  48 year old male with likely plantar fasciitis right foot; insertional posterior tibial tendinitis  -Review his MRI reviewed. -Conservative versus surgical treatment were discussed including alternatives, risks, complications. -Patient elects to proceed with steroid injection into the right heel. Under sterile skin preparation, a total of 2.5cc of kenalog 10, 0.5% Marcaine plain, and 2% lidocaine plain were infiltrated into the symptomatic area without complication. A band-aid was applied. Patient tolerated the injection well without complication.  -Applied plantar fascial strapping. -Ice to the effected area -Stretching exercises discussed. -Continue anti-inflammatories. Side effects such as GI upset were discussed and was informed to stop taking if any are to occur.  -Importance of supportive shoe gear discussed. Also discussed orthotic therapy to help control his foot type. -Followup in 3 weeks or sooner if any palms are to arise or any changes symptoms. In the meantime call any questions, concerns.

## 2014-01-27 ENCOUNTER — Ambulatory Visit: Payer: Medicaid Other | Admitting: Podiatry

## 2014-01-29 ENCOUNTER — Encounter: Payer: Self-pay | Admitting: Podiatry

## 2014-02-10 ENCOUNTER — Ambulatory Visit (INDEPENDENT_AMBULATORY_CARE_PROVIDER_SITE_OTHER): Payer: Medicaid Other | Admitting: Podiatry

## 2014-02-10 ENCOUNTER — Encounter: Payer: Self-pay | Admitting: Podiatry

## 2014-02-10 VITALS — BP 163/84 | HR 77 | Resp 15

## 2014-02-10 DIAGNOSIS — M722 Plantar fascial fibromatosis: Secondary | ICD-10-CM

## 2014-02-10 NOTE — Progress Notes (Signed)
Patient ID: David Brooks, male   DOB: Dec 11, 1965, 48 y.o.   MRN: [id number]  Subjective: David Brooks returns to the office today for follow-up evaluation of right foot plantar fasciitis and posterior tibial tendinitis. He states that his pain has improved since last appointment. He's been continuing to stretch and exercises as well as icing. He is unable to purchase over-the-counter inserts. She said the injection has helped. He does state that typically when he has this discomfort he gets an injection and it typically resolves in approximately 2 months. He has purchased a night splint a couple days ago. He denies any acute changes since last appointment. No other complaints at this time. Denies any recent injury or trauma.  Objective: AAO x3, NAD DP/PT pulses palpable bilaterally, CRT less than 3 seconds Protective sensation intact with Simms Weinstein monofilament, vibratory sensation intact, Achilles tendon reflex intact Mild tenderness to palpation over the plantar medial tubercle of the calcaneus at insertion of the plantar fascia on the right foot. No pain with lateral compression of the calcaneus and no pain along the posterior aspect or along the course of the Achilles tendon. No pain along the course of the plantar fascia with the arch of the foot and the plantar fascia appears intact. No tenderness along the course of the posterior tibial tendon nor along the insertion of the navicular. MMT 5/5, ROM WNL No calf pain, swelling, warmth, erythema  Assessment: 48 year old male with resolving right foot plantar fasciitis and posterior tibial tendinitis.  Plan: -Conservative versus surgical treatment discussed including alternatives, risks, complications. -This time patient wishes to hold off on any further steroid injection. If an appointment symptoms persist he will consider another one. -Continue ice to the affected area. -Continue stretching. -Continue shoe gear modifications. -Continue  night splint. -Discussed a plantar fascial brace. I gave him the name of one to look online.  -Follow-up in 4 weeks or sooner if any problems are to arise or any change in symptoms. In the meantime, call with any questions, concerns.

## 2014-02-10 NOTE — Patient Instructions (Signed)
Plantar Fasciitis (Heel Spur Syndrome) with Rehab The plantar fascia is a fibrous, ligament-like, soft-tissue structure that spans the bottom of the foot. Plantar fasciitis is a condition that causes pain in the foot due to inflammation of the tissue. SYMPTOMS   Pain and tenderness on the underneath side of the foot.  Pain that worsens with standing or walking. CAUSES  Plantar fasciitis is caused by irritation and injury to the plantar fascia on the underneath side of the foot. Common mechanisms of injury include:  Direct trauma to bottom of the foot.  Damage to a small nerve that runs under the foot where the main fascia attaches to the heel bone.  Stress placed on the plantar fascia due to bone spurs. RISK INCREASES WITH:   Activities that place stress on the plantar fascia (running, jumping, pivoting, or cutting).  Poor strength and flexibility.  Improperly fitted shoes.  Tight calf muscles.  Flat feet.  Failure to warm-up properly before activity.  Obesity. PREVENTION  Warm up and stretch properly before activity.  Allow for adequate recovery between workouts.  Maintain physical fitness:  Strength, flexibility, and endurance.  Cardiovascular fitness.  Maintain a health body weight.  Avoid stress on the plantar fascia.  Wear properly fitted shoes, including arch supports for individuals who have flat feet. PROGNOSIS  If treated properly, then the symptoms of plantar fasciitis usually resolve without surgery. However, occasionally surgery is necessary. RELATED COMPLICATIONS   Recurrent symptoms that may result in a chronic condition.  Problems of the lower back that are caused by compensating for the injury, such as limping.  Pain or weakness of the foot during push-off following surgery.  Chronic inflammation, scarring, and partial or complete fascia tear, occurring more often from repeated injections. TREATMENT  Treatment initially involves the use of  ice and medication to help reduce pain and inflammation. The use of strengthening and stretching exercises may help reduce pain with activity, especially stretches of the Achilles tendon. These exercises may be performed at home or with a therapist. Your caregiver may recommend that you use heel cups of arch supports to help reduce stress on the plantar fascia. Occasionally, corticosteroid injections are given to reduce inflammation. If symptoms persist for greater than 6 months despite non-surgical (conservative), then surgery may be recommended.  MEDICATION   If pain medication is necessary, then nonsteroidal anti-inflammatory medications, such as aspirin and ibuprofen, or other minor pain relievers, such as acetaminophen, are often recommended.  Do not take pain medication within 7 days before surgery.  Prescription pain relievers may be given if deemed necessary by your caregiver. Use only as directed and only as much as you need.  Corticosteroid injections may be given by your caregiver. These injections should be reserved for the most serious cases, because they may only be given a certain number of times. HEAT AND COLD  Cold treatment (icing) relieves pain and reduces inflammation. Cold treatment should be applied for 10 to 15 minutes every 2 to 3 hours for inflammation and pain and immediately after any activity that aggravates your symptoms. Use ice packs or massage the area with a piece of ice (ice massage).  Heat treatment may be used prior to performing the stretching and strengthening activities prescribed by your caregiver, physical therapist, or athletic trainer. Use a heat pack or soak the injury in warm water. SEEK IMMEDIATE MEDICAL CARE IF:  Treatment seems to offer no benefit, or the condition worsens.  Any medications produce adverse side effects. EXERCISES RANGE   OF MOTION (ROM) AND STRETCHING EXERCISES - Plantar Fasciitis (Heel Spur Syndrome) These exercises may help you  when beginning to rehabilitate your injury. Your symptoms may resolve with or without further involvement from your physician, physical therapist or athletic trainer. While completing these exercises, remember:   Restoring tissue flexibility helps normal motion to return to the joints. This allows healthier, less painful movement and activity.  An effective stretch should be held for at least 30 seconds.  A stretch should never be painful. You should only feel a gentle lengthening or release in the stretched tissue. RANGE OF MOTION - Toe Extension, Flexion  Sit with your right / left leg crossed over your opposite knee.  Grasp your toes and gently pull them back toward the top of your foot. You should feel a stretch on the bottom of your toes and/or foot.  Hold this stretch for __________ seconds.  Now, gently pull your toes toward the bottom of your foot. You should feel a stretch on the top of your toes and or foot.  Hold this stretch for __________ seconds. Repeat __________ times. Complete this stretch __________ times per day.  RANGE OF MOTION - Ankle Dorsiflexion, Active Assisted  Remove shoes and sit on a chair that is preferably not on a carpeted surface.  Place right / left foot under knee. Extend your opposite leg for support.  Keeping your heel down, slide your right / left foot back toward the chair until you feel a stretch at your ankle or calf. If you do not feel a stretch, slide your bottom forward to the edge of the chair, while still keeping your heel down.  Hold this stretch for __________ seconds. Repeat __________ times. Complete this stretch __________ times per day.  STRETCH - Gastroc, Standing  Place hands on wall.  Extend right / left leg, keeping the front knee somewhat bent.  Slightly point your toes inward on your back foot.  Keeping your right / left heel on the floor and your knee straight, shift your weight toward the wall, not allowing your back to  arch.  You should feel a gentle stretch in the right / left calf. Hold this position for __________ seconds. Repeat __________ times. Complete this stretch __________ times per day. STRETCH - Soleus, Standing  Place hands on wall.  Extend right / left leg, keeping the other knee somewhat bent.  Slightly point your toes inward on your back foot.  Keep your right / left heel on the floor, bend your back knee, and slightly shift your weight over the back leg so that you feel a gentle stretch deep in your back calf.  Hold this position for __________ seconds. Repeat __________ times. Complete this stretch __________ times per day. STRETCH - Gastrocsoleus, Standing  Note: This exercise can place a lot of stress on your foot and ankle. Please complete this exercise only if specifically instructed by your caregiver.   Place the ball of your right / left foot on a step, keeping your other foot firmly on the same step.  Hold on to the wall or a rail for balance.  Slowly lift your other foot, allowing your body weight to press your heel down over the edge of the step.  You should feel a stretch in your right / left calf.  Hold this position for __________ seconds.  Repeat this exercise with a slight bend in your right / left knee. Repeat __________ times. Complete this stretch __________ times per day.    STRENGTHENING EXERCISES - Plantar Fasciitis (Heel Spur Syndrome)  These exercises may help you when beginning to rehabilitate your injury. They may resolve your symptoms with or without further involvement from your physician, physical therapist or athletic trainer. While completing these exercises, remember:   Muscles can gain both the endurance and the strength needed for everyday activities through controlled exercises.  Complete these exercises as instructed by your physician, physical therapist or athletic trainer. Progress the resistance and repetitions only as guided. STRENGTH -  Towel Curls  Sit in a chair positioned on a non-carpeted surface.  Place your foot on a towel, keeping your heel on the floor.  Pull the towel toward your heel by only curling your toes. Keep your heel on the floor.  If instructed by your physician, physical therapist or athletic trainer, add ____________________ at the end of the towel. Repeat __________ times. Complete this exercise __________ times per day. STRENGTH - Ankle Inversion  Secure one end of a rubber exercise band/tubing to a fixed object (table, pole). Loop the other end around your foot just before your toes.  Place your fists between your knees. This will focus your strengthening at your ankle.  Slowly, pull your big toe up and in, making sure the band/tubing is positioned to resist the entire motion.  Hold this position for __________ seconds.  Have your muscles resist the band/tubing as it slowly pulls your foot back to the starting position. Repeat __________ times. Complete this exercises __________ times per day.  Document Released: 04/02/2005 Document Revised: 06/25/2011 Document Reviewed: 07/15/2008 [employer] Patient Information 2015 [employer]. This information is not intended to replace advice given to you by your health care provider. Make sure you discuss any questions you have with your health care provider.  

## 2014-03-10 ENCOUNTER — Ambulatory Visit (INDEPENDENT_AMBULATORY_CARE_PROVIDER_SITE_OTHER): Payer: Medicaid Other | Admitting: Podiatry

## 2014-03-10 ENCOUNTER — Encounter: Payer: Self-pay | Admitting: Podiatry

## 2014-03-10 VITALS — BP 167/115 | HR 73 | Resp 18

## 2014-03-10 DIAGNOSIS — M722 Plantar fascial fibromatosis: Secondary | ICD-10-CM

## 2014-03-10 DIAGNOSIS — M7661 Achilles tendinitis, right leg: Secondary | ICD-10-CM

## 2014-03-10 NOTE — Patient Instructions (Signed)
Plantar Fasciitis (Heel Spur Syndrome) with Rehab The plantar fascia is a fibrous, ligament-like, soft-tissue structure that spans the bottom of the foot. Plantar fasciitis is a condition that causes pain in the foot due to inflammation of the tissue. SYMPTOMS   Pain and tenderness on the underneath side of the foot.  Pain that worsens with standing or walking. CAUSES  Plantar fasciitis is caused by irritation and injury to the plantar fascia on the underneath side of the foot. Common mechanisms of injury include:  Direct trauma to bottom of the foot.  Damage to a small nerve that runs under the foot where the main fascia attaches to the heel bone.  Stress placed on the plantar fascia due to bone spurs. RISK INCREASES WITH:   Activities that place stress on the plantar fascia (running, jumping, pivoting, or cutting).  Poor strength and flexibility.  Improperly fitted shoes.  Tight calf muscles.  Flat feet.  Failure to warm-up properly before activity.  Obesity. PREVENTION  Warm up and stretch properly before activity.  Allow for adequate recovery between workouts.  Maintain physical fitness:  Strength, flexibility, and endurance.  Cardiovascular fitness.  Maintain a health body weight.  Avoid stress on the plantar fascia.  Wear properly fitted shoes, including arch supports for individuals who have flat feet. PROGNOSIS  If treated properly, then the symptoms of plantar fasciitis usually resolve without surgery. However, occasionally surgery is necessary. RELATED COMPLICATIONS   Recurrent symptoms that may result in a chronic condition.  Problems of the lower back that are caused by compensating for the injury, such as limping.  Pain or weakness of the foot during push-off following surgery.  Chronic inflammation, scarring, and partial or complete fascia tear, occurring more often from repeated injections. TREATMENT  Treatment initially involves the use of  ice and medication to help reduce pain and inflammation. The use of strengthening and stretching exercises may help reduce pain with activity, especially stretches of the Achilles tendon. These exercises may be performed at home or with a therapist. Your caregiver may recommend that you use heel cups of arch supports to help reduce stress on the plantar fascia. Occasionally, corticosteroid injections are given to reduce inflammation. If symptoms persist for greater than 6 months despite non-surgical (conservative), then surgery may be recommended.  MEDICATION   If pain medication is necessary, then nonsteroidal anti-inflammatory medications, such as aspirin and ibuprofen, or other minor pain relievers, such as acetaminophen, are often recommended.  Do not take pain medication within 7 days before surgery.  Prescription pain relievers may be given if deemed necessary by your caregiver. Use only as directed and only as much as you need.  Corticosteroid injections may be given by your caregiver. These injections should be reserved for the most serious cases, because they may only be given a certain number of times. HEAT AND COLD  Cold treatment (icing) relieves pain and reduces inflammation. Cold treatment should be applied for 10 to 15 minutes every 2 to 3 hours for inflammation and pain and immediately after any activity that aggravates your symptoms. Use ice packs or massage the area with a piece of ice (ice massage).  Heat treatment may be used prior to performing the stretching and strengthening activities prescribed by your caregiver, physical therapist, or athletic trainer. Use a heat pack or soak the injury in warm water. SEEK IMMEDIATE MEDICAL CARE IF:  Treatment seems to offer no benefit, or the condition worsens.  Any medications produce adverse side effects. EXERCISES RANGE   OF MOTION (ROM) AND STRETCHING EXERCISES - Plantar Fasciitis (Heel Spur Syndrome) These exercises may help you  when beginning to rehabilitate your injury. Your symptoms may resolve with or without further involvement from your physician, physical therapist or athletic trainer. While completing these exercises, remember:   Restoring tissue flexibility helps normal motion to return to the joints. This allows healthier, less painful movement and activity.  An effective stretch should be held for at least 30 seconds.  A stretch should never be painful. You should only feel a gentle lengthening or release in the stretched tissue. RANGE OF MOTION - Toe Extension, Flexion  Sit with your right / left leg crossed over your opposite knee.  Grasp your toes and gently pull them back toward the top of your foot. You should feel a stretch on the bottom of your toes and/or foot.  Hold this stretch for __________ seconds.  Now, gently pull your toes toward the bottom of your foot. You should feel a stretch on the top of your toes and or foot.  Hold this stretch for __________ seconds. Repeat __________ times. Complete this stretch __________ times per day.  RANGE OF MOTION - Ankle Dorsiflexion, Active Assisted  Remove shoes and sit on a chair that is preferably not on a carpeted surface.  Place right / left foot under knee. Extend your opposite leg for support.  Keeping your heel down, slide your right / left foot back toward the chair until you feel a stretch at your ankle or calf. If you do not feel a stretch, slide your bottom forward to the edge of the chair, while still keeping your heel down.  Hold this stretch for __________ seconds. Repeat __________ times. Complete this stretch __________ times per day.  STRETCH - Gastroc, Standing  Place hands on wall.  Extend right / left leg, keeping the front knee somewhat bent.  Slightly point your toes inward on your back foot.  Keeping your right / left heel on the floor and your knee straight, shift your weight toward the wall, not allowing your back to  arch.  You should feel a gentle stretch in the right / left calf. Hold this position for __________ seconds. Repeat __________ times. Complete this stretch __________ times per day. STRETCH - Soleus, Standing  Place hands on wall.  Extend right / left leg, keeping the other knee somewhat bent.  Slightly point your toes inward on your back foot.  Keep your right / left heel on the floor, bend your back knee, and slightly shift your weight over the back leg so that you feel a gentle stretch deep in your back calf.  Hold this position for __________ seconds. Repeat __________ times. Complete this stretch __________ times per day. STRETCH - Gastrocsoleus, Standing  Note: This exercise can place a lot of stress on your foot and ankle. Please complete this exercise only if specifically instructed by your caregiver.   Place the ball of your right / left foot on a step, keeping your other foot firmly on the same step.  Hold on to the wall or a rail for balance.  Slowly lift your other foot, allowing your body weight to press your heel down over the edge of the step.  You should feel a stretch in your right / left calf.  Hold this position for __________ seconds.  Repeat this exercise with a slight bend in your right / left knee. Repeat __________ times. Complete this stretch __________ times per day.    STRENGTHENING EXERCISES - Plantar Fasciitis (Heel Spur Syndrome)  These exercises may help you when beginning to rehabilitate your injury. They may resolve your symptoms with or without further involvement from your physician, physical therapist or athletic trainer. While completing these exercises, remember:   Muscles can gain both the endurance and the strength needed for everyday activities through controlled exercises.  Complete these exercises as instructed by your physician, physical therapist or athletic trainer. Progress the resistance and repetitions only as guided. STRENGTH -  Towel Curls  Sit in a chair positioned on a non-carpeted surface.  Place your foot on a towel, keeping your heel on the floor.  Pull the towel toward your heel by only curling your toes. Keep your heel on the floor.  If instructed by your physician, physical therapist or athletic trainer, add ____________________ at the end of the towel. Repeat __________ times. Complete this exercise __________ times per day. STRENGTH - Ankle Inversion  Secure one end of a rubber exercise band/tubing to a fixed object (table, pole). Loop the other end around your foot just before your toes.  Place your fists between your knees. This will focus your strengthening at your ankle.  Slowly, pull your big toe up and in, making sure the band/tubing is positioned to resist the entire motion.  Hold this position for __________ seconds.  Have your muscles resist the band/tubing as it slowly pulls your foot back to the starting position. Repeat __________ times. Complete this exercises __________ times per day.  Document Released: 04/02/2005 Document Revised: 06/25/2011 Document Reviewed: 07/15/2008 [employer] Patient Information 2015 [employer]. This information is not intended to replace advice given to you by your health care provider. Make sure you discuss any questions you have with your health care provider. Achilles Tendinitis  with Rehab Achilles tendinitis is a disorder of the Achilles tendon. The Achilles tendon connects the large calf muscles (Gastrocnemius and Soleus) to the heel bone (calcaneus). This tendon is sometimes called the heel cord. It is important for pushing-off and standing on your toes and is important for walking, running, or jumping. Tendinitis is often caused by overuse and repetitive microtrauma. SYMPTOMS  Pain, tenderness, swelling, warmth, and redness may occur over the Achilles tendon even at rest.  Pain with pushing off, or flexing or extending the ankle.  Pain that is  worsened after or during activity. CAUSES   Overuse sometimes seen with rapid increase in exercise programs or in sports requiring running and jumping.  Poor physical conditioning (strength and flexibility or endurance).  Running sports, especially training running down hills.  Inadequate warm-up before practice or play or failure to stretch before participation.  Injury to the tendon. PREVENTION   Warm up and stretch before practice or competition.  Allow time for adequate rest and recovery between practices and competition.  Keep up conditioning.  Keep up ankle and leg flexibility.  Improve or keep muscle strength and endurance.  Improve cardiovascular fitness.  Use proper technique.  Use proper equipment (shoes, skates).  To help prevent recurrence, taping, protective strapping, or an adhesive bandage may be recommended for several weeks after healing is complete. PROGNOSIS   Recovery may take weeks to several months to heal.  Longer recovery is expected if symptoms have been prolonged.  Recovery is usually quicker if the inflammation is due to a direct blow as compared with overuse or sudden strain. RELATED COMPLICATIONS   Healing time will be prolonged if the condition is not correctly treated. The injury must   be given plenty of time to heal.  Symptoms can reoccur if activity is resumed too soon.  Untreated, tendinitis may increase the risk of tendon rupture requiring additional time for recovery and possibly surgery. TREATMENT   The first treatment consists of rest anti-inflammatory medication, and ice to relieve the pain.  Stretching and strengthening exercises after resolution of pain will likely help reduce the risk of recurrence. Referral to a physical therapist or athletic trainer for further evaluation and treatment may be helpful.  A walking boot or cast may be recommended to rest the Achilles tendon. This can help break the cycle of inflammation and  microtrauma.  Arch supports (orthotics) may be prescribed or recommended by your caregiver as an adjunct to therapy and rest.  Surgery to remove the inflamed tendon lining or degenerated tendon tissue is rarely necessary and has shown less than predictable results. MEDICATION   Nonsteroidal anti-inflammatory medications, such as aspirin and ibuprofen, may be used for pain and inflammation relief. Do not take within 7 days before surgery. Take these as directed by your caregiver. Contact your caregiver immediately if any bleeding, stomach upset, or signs of allergic reaction occur. Other minor pain relievers, such as acetaminophen, may also be used.  Pain relievers may be prescribed as necessary by your caregiver. Do not take prescription pain medication for longer than 4 to 7 days. Use only as directed and only as much as you need.  Cortisone injections are rarely indicated. Cortisone injections may weaken tendons and predispose to rupture. It is better to give the condition more time to heal than to use them. HEAT AND COLD  Cold is used to relieve pain and reduce inflammation for acute and chronic Achilles tendinitis. Cold should be applied for 10 to 15 minutes every 2 to 3 hours for inflammation and pain and immediately after any activity that aggravates your symptoms. Use ice packs or an ice massage.  Heat may be used before performing stretching and strengthening activities prescribed by your caregiver. Use a heat pack or a warm soak. SEEK MEDICAL CARE IF:  Symptoms get worse or do not improve in 2 weeks despite treatment.  New, unexplained symptoms develop. Drugs used in treatment may produce side effects. EXERCISES RANGE OF MOTION (ROM) AND STRETCHING EXERCISES - Achilles Tendinitis  These exercises may help you when beginning to rehabilitate your injury. Your symptoms may resolve with or without further involvement from your physician, physical therapist or athletic trainer. While  completing these exercises, remember:   Restoring tissue flexibility helps normal motion to return to the joints. This allows healthier, less painful movement and activity.  An effective stretch should be held for at least 30 seconds.  A stretch should never be painful. You should only feel a gentle lengthening or release in the stretched tissue. STRETCH - Gastroc, Standing   Place hands on wall.  Extend right / left leg, keeping the front knee somewhat bent.  Slightly point your toes inward on your back foot.  Keeping your right / left heel on the floor and your knee straight, shift your weight toward the wall, not allowing your back to arch.  You should feel a gentle stretch in the right / left calf. Hold this position for __________ seconds. Repeat __________ times. Complete this stretch __________ times per day. STRETCH - Soleus, Standing   Place hands on wall.  Extend right / left leg, keeping the other knee somewhat bent.  Slightly point your toes inward on your back foot.    Keep your right / left heel on the floor, bend your back knee, and slightly shift your weight over the back leg so that you feel a gentle stretch deep in your back calf.  Hold this position for __________ seconds. Repeat __________ times. Complete this stretch __________ times per day. STRETCH - Gastrocsoleus, Standing  Note: This exercise can place a lot of stress on your foot and ankle. Please complete this exercise only if specifically instructed by your caregiver.   Place the ball of your right / left foot on a step, keeping your other foot firmly on the same step.  Hold on to the wall or a rail for balance.  Slowly lift your other foot, allowing your body weight to press your heel down over the edge of the step.  You should feel a stretch in your right / left calf.  Hold this position for __________ seconds.  Repeat this exercise with a slight bend in your knee. Repeat __________ times.  Complete this stretch __________ times per day.  STRENGTHENING EXERCISES - Achilles Tendinitis These exercises may help you when beginning to rehabilitate your injury. They may resolve your symptoms with or without further involvement from your physician, physical therapist or athletic trainer. While completing these exercises, remember:   Muscles can gain both the endurance and the strength needed for everyday activities through controlled exercises.  Complete these exercises as instructed by your physician, physical therapist or athletic trainer. Progress the resistance and repetitions only as guided.  You may experience muscle soreness or fatigue, but the pain or discomfort you are trying to eliminate should never worsen during these exercises. If this pain does worsen, stop and make certain you are following the directions exactly. If the pain is still present after adjustments, discontinue the exercise until you can discuss the trouble with your clinician. STRENGTH - Plantar-flexors   Sit with your right / left leg extended. Holding onto both ends of a rubber exercise band/tubing, loop it around the ball of your foot. Keep a slight tension in the band.  Slowly push your toes away from you, pointing them downward.  Hold this position for __________ seconds. Return slowly, controlling the tension in the band/tubing. Repeat __________ times. Complete this exercise __________ times per day.  STRENGTH - Plantar-flexors   Stand with your feet shoulder width apart. Steady yourself with a wall or table using as little support as needed.  Keeping your weight evenly spread over the width of your feet, rise up on your toes.*  Hold this position for __________ seconds. Repeat __________ times. Complete this exercise __________ times per day.  *If this is too easy, shift your weight toward your right / left leg until you feel challenged. Ultimately, you may be asked to do this exercise with your  right / left foot only. STRENGTH - Plantar-flexors, Eccentric  Note: This exercise can place a lot of stress on your foot and ankle. Please complete this exercise only if specifically instructed by your caregiver.   Place the balls of your feet on a step. With your hands, use only enough support from a wall or rail to keep your balance.  Keep your knees straight and rise up on your toes.  Slowly shift your weight entirely to your right / left toes and pick up your opposite foot. Gently and with controlled movement, lower your weight through your right / left foot so that your heel drops below the level of the step. You will feel a   slight stretch in the back of your calf at the end position.  Use the healthy leg to help rise up onto the balls of both feet, then lower weight only on the right / left leg again. Build up to 15 repetitions. Then progress to 3 consecutive sets of 15 repetitions.*  After completing the above exercise, complete the same exercise with a slight knee bend (about 30 degrees). Again, build up to 15 repetitions. Then progress to 3 consecutive sets of 15 repetitions.* Perform this exercise __________ times per day.  *When you easily complete 3 sets of 15, your physician, physical therapist or athletic trainer may advise you to add resistance by wearing a backpack filled with additional weight. STRENGTH - Plantar Flexors, Seated   Sit on a chair that allows your feet to rest flat on the ground. If necessary, sit at the edge of the chair.  Keeping your toes firmly on the ground, lift your right / left heel as far as you can without increasing any discomfort in your ankle. Repeat __________ times. Complete this exercise __________ times a day. *If instructed by your physician, physical therapist or athletic trainer, you may add ____________________ of resistance by placing a weighted object on your right / left knee. Document Released: 11/01/2004 Document Revised: 06/25/2011  Document Reviewed: 07/15/2008 [employer] Patient Information 2015 [employer]. This information is not intended to replace advice given to you by your health care provider. Make sure you discuss any questions you have with your health care provider.  

## 2014-03-10 NOTE — Progress Notes (Signed)
Patient ID: David Brooks, male   DOB: Aug 30, 1965, 48 y.o.   MRN: [id number]  Subjective: 48 year old male returns the office they for follow-up evaluation of right foot plantar fasciitis. He states that since last appointment he has had some improvement in symptoms. He does state that he is started to develop some pain into the back of his heel although it has improved of the last couple weeks. He also states he has purchased the night splint and has been using this. His been continuing stretching as well as icing exercises. He has also purchased over-the-counter inserts for his shoes. Denies any other acute changes since last appointment. No other complaints at this time. Denies any systemic complaints of fevers, chills, nausea, vomiting.  Objective: AAO 3, NAD DP/PT pulses palpable bilaterally, CRT less than 3 seconds Protective sensation intact with Simms Weinstein monofilament, vibratory sensation intact, Achilles tendon reflex intact. Mild tenderness on the plantar medial tubercle of the calcaneus on the right foot at the insertion of the plantar fascia. There is no pain along the course of plantar fascial in the arch of the foot. There is no pain with lateral compression of the calcaneus or pain with vibratory sensation. There is mild discomfort along the inferior aspect of the Achilles tendon at the insertion of the tendon onto the calcaneus. There is no pain along the course of the Achilles tendon itself. Achilles tendon is intact. There is no overlying edema, erythema, increase in warmth to the foot/ankle. There is no pain on the course of the posterior tibial tendon or along its insertion into the navicular. MMT 5/5, ROM WNL No pain with calf compression, swelling, warmth, erythema. No open lesions or pre-ulcerative lesions.  Assessment: 48 year old male with right foot plantar fasciitis, Achilles insertional tendinitis.  Plan: -Treatment options discussed including alternatives, risks,  complications. -At this time the patient states that he wants to hold off on another steroid injection this time as he works as to define he has a busy couple days of the holidays working. He states that after Thanksgiving he'll call make an appointment for another steroid injection if needed. -Continue with stretching exercises -Ice to the affected area. -Plantar fascial taping applied today. -Continue night splint -Follow-up as needed. Patient's physeal call the holidays. In the meantime, call the office with any questions, concerns changes symptoms.

## 2014-04-13 ENCOUNTER — Emergency Department (HOSPITAL_COMMUNITY)
Admission: EM | Admit: 2014-04-13 | Discharge: 2014-04-13 | Disposition: A | Discharge status: Home / Self Care | Payer: Medicaid Other | Attending: Emergency Medicine | Admitting: Emergency Medicine

## 2014-04-13 ENCOUNTER — Encounter (HOSPITAL_COMMUNITY): Payer: Self-pay | Admitting: *Deleted

## 2014-04-13 ENCOUNTER — Emergency Department (HOSPITAL_COMMUNITY): Payer: Medicaid Other

## 2014-04-13 DIAGNOSIS — Z880 Allergy status to penicillin: Secondary | ICD-10-CM | POA: Diagnosis not present

## 2014-04-13 DIAGNOSIS — K5900 Constipation, unspecified: Secondary | ICD-10-CM | POA: Insufficient documentation

## 2014-04-13 DIAGNOSIS — Z79899 Other long term (current) drug therapy: Secondary | ICD-10-CM | POA: Insufficient documentation

## 2014-04-13 DIAGNOSIS — I10 Essential (primary) hypertension: Secondary | ICD-10-CM | POA: Insufficient documentation

## 2014-04-13 DIAGNOSIS — E119 Type 2 diabetes mellitus without complications: Secondary | ICD-10-CM | POA: Insufficient documentation

## 2014-04-13 DIAGNOSIS — R109 Unspecified abdominal pain: Secondary | ICD-10-CM | POA: Diagnosis present

## 2014-04-13 LAB — COMPREHENSIVE METABOLIC PANEL
ALT: 31 U/L (ref 0–53)
AST: 25 U/L (ref 0–37)
Albumin: 3.9 g/dL (ref 3.5–5.2)
Alkaline Phosphatase: 60 U/L (ref 39–117)
Anion gap: 8 (ref 5–15)
BILIRUBIN TOTAL: 0.5 mg/dL (ref 0.3–1.2)
BUN: 21 mg/dL (ref 6–23)
CALCIUM: 9.3 mg/dL (ref 8.4–10.5)
CHLORIDE: 100 meq/L (ref 96–112)
CO2: 30 mmol/L (ref 19–32)
Creatinine, Ser: 1.09 mg/dL (ref 0.50–1.35)
GFR, EST NON AFRICAN AMERICAN: 79 mL/min — AB (ref >90)
GLUCOSE: 155 mg/dL — AB (ref 70–99)
Potassium: 3.5 mmol/L (ref 3.5–5.1)
SODIUM: 138 mmol/L (ref 135–145)
Total Protein: 7.8 g/dL (ref 6.0–8.3)

## 2014-04-13 LAB — CBC WITH DIFFERENTIAL/PLATELET
BASOS ABS: 0 10*3/uL (ref 0.0–0.1)
Basophils Relative: 1 % (ref 0–1)
EOS ABS: 0.1 10*3/uL (ref 0.0–0.7)
Eosinophils Relative: 1 % (ref 0–5)
HCT: 45.9 % (ref 39.0–52.0)
Hemoglobin: 15.9 g/dL (ref 13.0–17.0)
LYMPHS ABS: 1.7 10*3/uL (ref 0.7–4.0)
Lymphocytes Relative: 25 % (ref 12–46)
MCH: 29.7 pg (ref 26.0–34.0)
MCHC: 34.6 g/dL (ref 30.0–36.0)
MCV: 85.8 fL (ref 78.0–100.0)
Monocytes Absolute: 0.7 10*3/uL (ref 0.1–1.0)
Monocytes Relative: 10 % (ref 3–12)
NEUTROS PCT: 63 % (ref 43–77)
Neutro Abs: 4.3 10*3/uL (ref 1.7–7.7)
PLATELETS: 260 10*3/uL (ref 150–400)
RBC: 5.35 MIL/uL (ref 4.22–5.81)
RDW: 12.7 % (ref 11.5–15.5)
WBC: 6.9 10*3/uL (ref 4.0–10.5)

## 2014-04-13 LAB — LIPASE, BLOOD: LIPASE: 22 U/L (ref 11–59)

## 2014-04-13 LAB — AMYLASE: Amylase: 83 U/L (ref 0–105)

## 2014-04-13 MED ORDER — ONDANSETRON HCL 4 MG/2ML IJ SOLN
4.0000 mg | Freq: Once | INTRAMUSCULAR | Status: AC
  Administered 2014-04-13: 4 mg via INTRAVENOUS
  Filled 2014-04-13: qty 2

## 2014-04-13 MED ORDER — SODIUM CHLORIDE 0.9 % IJ SOLN
INTRAMUSCULAR | Status: AC
  Filled 2014-04-13: qty 45

## 2014-04-13 MED ORDER — SODIUM CHLORIDE 0.9 % IV BOLUS (SEPSIS)
500.0000 mL | Freq: Once | INTRAVENOUS | Status: AC
  Administered 2014-04-13: 1000 mL via INTRAVENOUS

## 2014-04-13 MED ORDER — IOHEXOL 300 MG/ML  SOLN
100.0000 mL | Freq: Once | INTRAMUSCULAR | Status: AC | PRN
  Administered 2014-04-13: 100 mL via INTRAVENOUS

## 2014-04-13 MED ORDER — SODIUM CHLORIDE 0.9 % IJ SOLN
INTRAMUSCULAR | Status: AC
  Filled 2014-04-13: qty 500

## 2014-04-13 MED ORDER — TRAMADOL HCL 50 MG PO TABS: 50.0000 mg | ORAL_TABLET | Freq: Four times a day (QID) | ORAL | Status: DC | PRN

## 2014-04-13 MED ORDER — SODIUM CHLORIDE 0.9 % IV SOLN
INTRAVENOUS | Status: DC
  Administered 2014-04-13: 14:00:00 via INTRAVENOUS

## 2014-04-13 MED ORDER — FAMOTIDINE 20 MG PO TABS: 20.0000 mg | ORAL_TABLET | Freq: Two times a day (BID) | ORAL | Status: DC

## 2014-04-13 NOTE — ED Provider Notes (Addendum)
CSN: [id number]     Arrival date & time 04/13/14  1124 History   First MD Initiated Contact with Patient 04/13/14 1311     Chief Complaint  Patient presents with  . Abdominal Pain     (Consider location/radiation/quality/duration/timing/severity/associated sxs/prior Treatment) Patient is a 48 y.o. male presenting with abdominal pain.  Abdominal Pain Associated symptoms: constipation   Associated symptoms: no chest pain, no diarrhea, no dysuria, no fever, no nausea, no shortness of breath and no vomiting    patient with onset of generalized abdominal pain and low back pain on December 23. Patient thought was constipation took a laxative with some relief. But the discomfort is back. Pain is all over worse at night. No fevers no nausea vomiting or diarrhea. Pain is 5 out of 10 and radiates to the back. Described as an ache. No history of similar pain. No history of any injury to the back. Back pain to the patient does not seem to be muscle skeletal in nature.  Past Medical History  Diagnosis Date  . Hypertension   . Rotator cuff (capsule) sprain   . Diabetes mellitus without complication    History reviewed. No pertinent past surgical history. Family History  Problem Relation Age of Onset  . Hypertension Mother   . Hyperlipidemia Mother   . Cancer Father     throat   History  Substance Use Topics  . Smoking status: Never Smoker   . Smokeless tobacco: Never Used  . Alcohol Use: No    Review of Systems  Constitutional: Negative for fever.  HENT: Negative for congestion.   Eyes: Negative for visual disturbance.  Respiratory: Negative for shortness of breath.   Cardiovascular: Negative for chest pain.  Gastrointestinal: Positive for abdominal pain and constipation. Negative for nausea, vomiting and diarrhea.  Genitourinary: Negative for dysuria.  Musculoskeletal: Positive for back pain.  Skin: Negative for rash.  Neurological: Negative for headaches.  Hematological: Does not  bruise/bleed easily.  Psychiatric/Behavioral: Negative for confusion.      Allergies  Penicillins and Shrimp  Home Medications   Prior to Admission medications   Medication Sig Start Date End Date Taking? Authorizing Provider  amLODipine (NORVASC) 10 MG tablet Take 10 mg by mouth daily.   Yes Historical Provider, MD  carvedilol (COREG) 12.5 MG tablet Take 12.5 mg by mouth daily.  02/22/11  Yes Historical Provider, MD  ibuprofen (ADVIL,MOTRIN) 800 MG tablet Take 800 mg by mouth every 6 (six) hours as needed for moderate pain.   Yes Historical Provider, MD  lisinopril-hydrochlorothiazide (PRINZIDE,ZESTORETIC) 10-12.5 MG per tablet Take by mouth. 02/22/11  Yes Historical Provider, MD  losartan-hydrochlorothiazide (HYZAAR) 100-25 MG per tablet Take 1 tablet by mouth daily.   Yes Historical Provider, MD  lovastatin (MEVACOR) 20 MG tablet Take 20 mg by mouth at bedtime.   Yes Historical Provider, MD  metformin (FORTAMET) 500 MG (OSM) 24 hr tablet Take 500 mg by mouth daily with breakfast.   Yes Historical Provider, MD  aspirin EC 81 MG tablet Take 81 mg by mouth daily.    Historical Provider, MD  famotidine (PEPCID) 20 MG tablet Take 1 tablet (20 mg total) by mouth 2 (two) times daily. 04/13/14   Vanetta Mulders, MD  sildenafil (VIAGRA) 100 MG tablet Take 0.5-1 tablets (50-100 mg total) by mouth daily as needed for erectile dysfunction. Patient not taking: Reported on 04/13/2014 10/22/12   Deatra Canter, FNP  tadalafil (CIALIS) 20 MG tablet Take 0.5-1 tablets (10-20 mg total) by  mouth every other day as needed for erectile dysfunction. Patient not taking: Reported on 04/13/2014 12/10/12   Deatra CanterWilliam J Oxford, FNP  traMADol (ULTRAM) 50 MG tablet Take 1 tablet (50 mg total) by mouth every 6 (six) hours as needed. 04/13/14   Vanetta MuldersScott Ebelin Dillehay, MD   BP 141/83 mmHg  Pulse 66  Temp(Src) 98.5 F (36.9 C) (Oral)  Resp 18  Ht 5\' 11"  (1.803 m)  Wt 330 lb (149.687 kg)  BMI 46.05 kg/m2  SpO2 98% Physical  Exam  Constitutional: He is oriented to person, place, and time. He appears well-developed and well-nourished. No distress.  HENT:  Head: Normocephalic and atraumatic.  Mouth/Throat: Oropharynx is clear and moist.  Eyes: Conjunctivae and EOM are normal. Pupils are equal, round, and reactive to light.  Neck: Normal range of motion. Neck supple.  Cardiovascular: Normal rate, regular rhythm and normal heart sounds.   No murmur heard. Pulmonary/Chest: Effort normal and breath sounds normal. No respiratory distress.  Abdominal: Soft. Bowel sounds are normal. There is no tenderness.  Musculoskeletal: Normal range of motion. He exhibits no edema.  Neurological: He is alert and oriented to person, place, and time. No cranial nerve deficit. He exhibits normal muscle tone. Coordination normal.  Skin: Skin is warm. No rash noted.  Nursing note and vitals reviewed.   ED Course  Procedures (including critical care time) Labs Review Labs Reviewed  COMPREHENSIVE METABOLIC PANEL - Abnormal; Notable for the following:    Glucose, Bld 155 (*)    GFR calc non Af Amer 79 (*)    All other components within normal limits  CBC WITH DIFFERENTIAL  LIPASE, BLOOD  AMYLASE   Results for orders placed or performed during the hospital encounter of 04/13/14  Comprehensive metabolic panel  Result Value Ref Range   Sodium 138 135 - 145 mmol/L   Potassium 3.5 3.5 - 5.1 mmol/L   Chloride 100 96 - 112 mEq/L   CO2 30 19 - 32 mmol/L   Glucose, Bld 155 (H) 70 - 99 mg/dL   BUN 21 6 - 23 mg/dL   Creatinine, Ser 1.611.09 0.50 - 1.35 mg/dL   Calcium 9.3 8.4 - 09.610.5 mg/dL   Total Protein 7.8 6.0 - 8.3 g/dL   Albumin 3.9 3.5 - 5.2 g/dL   AST 25 0 - 37 U/L   ALT 31 0 - 53 U/L   Alkaline Phosphatase 60 39 - 117 U/L   Total Bilirubin 0.5 0.3 - 1.2 mg/dL   GFR calc non Af Amer 79 (L) >90 mL/min   GFR calc Af Amer >90 >90 mL/min   Anion gap 8 5 - 15  CBC with Differential  Result Value Ref Range   WBC 6.9 4.0 - 10.5  K/uL   RBC 5.35 4.22 - 5.81 MIL/uL   Hemoglobin 15.9 13.0 - 17.0 g/dL   HCT 04.545.9 40.939.0 - 81.152.0 %   MCV 85.8 78.0 - 100.0 fL   MCH 29.7 26.0 - 34.0 pg   MCHC 34.6 30.0 - 36.0 g/dL   RDW 91.412.7 78.211.5 - 95.615.5 %   Platelets 260 150 - 400 K/uL   Neutrophils Relative % 63 43 - 77 %   Neutro Abs 4.3 1.7 - 7.7 K/uL   Lymphocytes Relative 25 12 - 46 %   Lymphs Abs 1.7 0.7 - 4.0 K/uL   Monocytes Relative 10 3 - 12 %   Monocytes Absolute 0.7 0.1 - 1.0 K/uL   Eosinophils Relative 1 0 - 5 %  Eosinophils Absolute 0.1 0.0 - 0.7 K/uL   Basophils Relative 1 0 - 1 %   Basophils Absolute 0.0 0.0 - 0.1 K/uL  Lipase, blood  Result Value Ref Range   Lipase 22 11 - 59 U/L  Amylase  Result Value Ref Range   Amylase 83 0 - 105 U/L     Imaging Review Ct Abdomen Pelvis W Contrast  04/13/2014   CLINICAL DATA:  Low back pain and abdominal pain.  EXAM: CT ABDOMEN AND PELVIS WITH CONTRAST  TECHNIQUE: Multidetector CT imaging of the abdomen and pelvis was performed using the standard protocol following bolus administration of intravenous contrast.  CONTRAST:  100mL OMNIPAQUE IOHEXOL 300 MG/ML  SOLN  COMPARISON:  None.  FINDINGS: There are 2 adjacent subpleural nodules in the left lower lobe seen on image 8, each measuring 4 mm. These are highly likely to be benign. No pleural or pericardial fluid. The right hemidiaphragm is elevated and there is volume loss at the right lung base. There is extensive coronary artery calcification, premature for age.  There is fatty change of the liver. No focal lesion. No calcified gallstones. The spleen is normal. The pancreas is normal. The adrenal glands are normal. The kidneys are normal. No cyst, mass, stone or hydronephrosis. The aorta shows atherosclerosis but no aneurysm. The IVC is normal. No retroperitoneal mass or adenopathy. No free intraperitoneal fluid or air. No sign of bowel pathology. The appendix is normal.  No evidence of spinal abnormality. There are mild osteoarthritic  changes at the sacroiliac joints.  IMPRESSION: Fatty liver, which can be a cause of pain.  Elevated right hemidiaphragm with volume loss at the right lung base.  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  Premature coronary artery calcification.   Electronically Signed   By: Paulina FusiMark  Shogry M.D.   On: 04/13/2014 16:26     EKG Interpretation None      MDM   Final diagnoses:  Abdominal pain    CT scan of the abdomen is pending. If negative patient can be discharged home. Labs without any significant findings. No leukocytosis. No renal insufficiency. No evidence of pancreatitis. Liver function test are normal. No evidence of the hyperbilirubin.   Vanetta MuldersScott Elira Colasanti, MD 04/13/14 1617  Vanetta MuldersScott Hatsumi Steinhart, MD 04/13/14 1705

## 2014-04-13 NOTE — Discharge Instructions (Signed)
Recommended trial of probiotic. These are available over-the-counter. Also recommend trial of Pepcid for the next 2 weeks. Tramadol as needed for discomfort. Make an appointment to follow-up with your regular doctor. Return for any new or worse symptoms.

## 2014-04-13 NOTE — ED Notes (Signed)
abd pain, no n/v, with low back pain,   Took laxative with  With relief , and now hurting and no BM since took laxative. On 12/25.

## 2014-04-13 NOTE — ED Notes (Signed)
Pt alert & oriented x4, stable gait. Patient given discharge instructions, paperwork & prescription(s). Patient  instructed to stop at the registration desk to finish any additional paperwork. Patient verbalized understanding. Pt left department w/ no further questions. 

## 2014-04-14 ENCOUNTER — Ambulatory Visit: Payer: Medicaid Other | Admitting: Podiatry

## 2014-04-23 ENCOUNTER — Ambulatory Visit (INDEPENDENT_AMBULATORY_CARE_PROVIDER_SITE_OTHER): Payer: Medicaid Other | Admitting: Podiatry

## 2014-04-23 ENCOUNTER — Encounter: Payer: Self-pay | Admitting: Podiatry

## 2014-04-23 VITALS — BP 178/88 | HR 87 | Resp 14

## 2014-04-23 DIAGNOSIS — M722 Plantar fascial fibromatosis: Secondary | ICD-10-CM | POA: Diagnosis not present

## 2014-04-23 MED ORDER — TRIAMCINOLONE ACETONIDE 10 MG/ML IJ SUSP
10.0000 mg | Freq: Once | INTRAMUSCULAR | Status: AC
  Administered 2014-04-23: 10 mg

## 2014-04-23 NOTE — Patient Instructions (Signed)
Plantar Fasciitis (Heel Spur Syndrome) with Rehab The plantar fascia is a fibrous, ligament-like, soft-tissue structure that spans the bottom of the foot. Plantar fasciitis is a condition that causes pain in the foot due to inflammation of the tissue. SYMPTOMS   Pain and tenderness on the underneath side of the foot.  Pain that worsens with standing or walking. CAUSES  Plantar fasciitis is caused by irritation and injury to the plantar fascia on the underneath side of the foot. Common mechanisms of injury include:  Direct trauma to bottom of the foot.  Damage to a small nerve that runs under the foot where the main fascia attaches to the heel bone.  Stress placed on the plantar fascia due to bone spurs. RISK INCREASES WITH:   Activities that place stress on the plantar fascia (running, jumping, pivoting, or cutting).  Poor strength and flexibility.  Improperly fitted shoes.  Tight calf muscles.  Flat feet.  Failure to warm-up properly before activity.  Obesity. PREVENTION  Warm up and stretch properly before activity.  Allow for adequate recovery between workouts.  Maintain physical fitness:  Strength, flexibility, and endurance.  Cardiovascular fitness.  Maintain a health body weight.  Avoid stress on the plantar fascia.  Wear properly fitted shoes, including arch supports for individuals who have flat feet. PROGNOSIS  If treated properly, then the symptoms of plantar fasciitis usually resolve without surgery. However, occasionally surgery is necessary. RELATED COMPLICATIONS   Recurrent symptoms that may result in a chronic condition.  Problems of the lower back that are caused by compensating for the injury, such as limping.  Pain or weakness of the foot during push-off following surgery.  Chronic inflammation, scarring, and partial or complete fascia tear, occurring more often from repeated injections. TREATMENT  Treatment initially involves the use of  ice and medication to help reduce pain and inflammation. The use of strengthening and stretching exercises may help reduce pain with activity, especially stretches of the Achilles tendon. These exercises may be performed at home or with a therapist. Your caregiver may recommend that you use heel cups of arch supports to help reduce stress on the plantar fascia. Occasionally, corticosteroid injections are given to reduce inflammation. If symptoms persist for greater than 6 months despite non-surgical (conservative), then surgery may be recommended.  MEDICATION   If pain medication is necessary, then nonsteroidal anti-inflammatory medications, such as aspirin and ibuprofen, or other minor pain relievers, such as acetaminophen, are often recommended.  Do not take pain medication within 7 days before surgery.  Prescription pain relievers may be given if deemed necessary by your caregiver. Use only as directed and only as much as you need.  Corticosteroid injections may be given by your caregiver. These injections should be reserved for the most serious cases, because they may only be given a certain number of times. HEAT AND COLD  Cold treatment (icing) relieves pain and reduces inflammation. Cold treatment should be applied for 10 to 15 minutes every 2 to 3 hours for inflammation and pain and immediately after any activity that aggravates your symptoms. Use ice packs or massage the area with a piece of ice (ice massage).  Heat treatment may be used prior to performing the stretching and strengthening activities prescribed by your caregiver, physical therapist, or athletic trainer. Use a heat pack or soak the injury in warm water. SEEK IMMEDIATE MEDICAL CARE IF:  Treatment seems to offer no benefit, or the condition worsens.  Any medications produce adverse side effects. EXERCISES RANGE   OF MOTION (ROM) AND STRETCHING EXERCISES - Plantar Fasciitis (Heel Spur Syndrome) These exercises may help you  when beginning to rehabilitate your injury. Your symptoms may resolve with or without further involvement from your physician, physical therapist or athletic trainer. While completing these exercises, remember:   Restoring tissue flexibility helps normal motion to return to the joints. This allows healthier, less painful movement and activity.  An effective stretch should be held for at least 30 seconds.  A stretch should never be painful. You should only feel a gentle lengthening or release in the stretched tissue. RANGE OF MOTION - Toe Extension, Flexion  Sit with your right / left leg crossed over your opposite knee.  Grasp your toes and gently pull them back toward the top of your foot. You should feel a stretch on the bottom of your toes and/or foot.  Hold this stretch for __________ seconds.  Now, gently pull your toes toward the bottom of your foot. You should feel a stretch on the top of your toes and or foot.  Hold this stretch for __________ seconds. Repeat __________ times. Complete this stretch __________ times per day.  RANGE OF MOTION - Ankle Dorsiflexion, Active Assisted  Remove shoes and sit on a chair that is preferably not on a carpeted surface.  Place right / left foot under knee. Extend your opposite leg for support.  Keeping your heel down, slide your right / left foot back toward the chair until you feel a stretch at your ankle or calf. If you do not feel a stretch, slide your bottom forward to the edge of the chair, while still keeping your heel down.  Hold this stretch for __________ seconds. Repeat __________ times. Complete this stretch __________ times per day.  STRETCH - Gastroc, Standing  Place hands on wall.  Extend right / left leg, keeping the front knee somewhat bent.  Slightly point your toes inward on your back foot.  Keeping your right / left heel on the floor and your knee straight, shift your weight toward the wall, not allowing your back to  arch.  You should feel a gentle stretch in the right / left calf. Hold this position for __________ seconds. Repeat __________ times. Complete this stretch __________ times per day. STRETCH - Soleus, Standing  Place hands on wall.  Extend right / left leg, keeping the other knee somewhat bent.  Slightly point your toes inward on your back foot.  Keep your right / left heel on the floor, bend your back knee, and slightly shift your weight over the back leg so that you feel a gentle stretch deep in your back calf.  Hold this position for __________ seconds. Repeat __________ times. Complete this stretch __________ times per day. STRETCH - Gastrocsoleus, Standing  Note: This exercise can place a lot of stress on your foot and ankle. Please complete this exercise only if specifically instructed by your caregiver.   Place the ball of your right / left foot on a step, keeping your other foot firmly on the same step.  Hold on to the wall or a rail for balance.  Slowly lift your other foot, allowing your body weight to press your heel down over the edge of the step.  You should feel a stretch in your right / left calf.  Hold this position for __________ seconds.  Repeat this exercise with a slight bend in your right / left knee. Repeat __________ times. Complete this stretch __________ times per day.    STRENGTHENING EXERCISES - Plantar Fasciitis (Heel Spur Syndrome)  These exercises may help you when beginning to rehabilitate your injury. They may resolve your symptoms with or without further involvement from your physician, physical therapist or athletic trainer. While completing these exercises, remember:   Muscles can gain both the endurance and the strength needed for everyday activities through controlled exercises.  Complete these exercises as instructed by your physician, physical therapist or athletic trainer. Progress the resistance and repetitions only as guided. STRENGTH -  Towel Curls  Sit in a chair positioned on a non-carpeted surface.  Place your foot on a towel, keeping your heel on the floor.  Pull the towel toward your heel by only curling your toes. Keep your heel on the floor.  If instructed by your physician, physical therapist or athletic trainer, add ____________________ at the end of the towel. Repeat __________ times. Complete this exercise __________ times per day. STRENGTH - Ankle Inversion  Secure one end of a rubber exercise band/tubing to a fixed object (table, pole). Loop the other end around your foot just before your toes.  Place your fists between your knees. This will focus your strengthening at your ankle.  Slowly, pull your big toe up and in, making sure the band/tubing is positioned to resist the entire motion.  Hold this position for __________ seconds.  Have your muscles resist the band/tubing as it slowly pulls your foot back to the starting position. Repeat __________ times. Complete this exercises __________ times per day.  Document Released: 04/02/2005 Document Revised: 06/25/2011 Document Reviewed: 07/15/2008 [employer] Patient Information 2015 [employer]. This information is not intended to replace advice given to you by your health care provider. Make sure you discuss any questions you have with your health care provider.  

## 2014-04-25 NOTE — Progress Notes (Signed)
Patient ID: David Brooks, male   DOB: 08/24/1965, 49 y.o.   MRN: [id number]  Subjective: 49 year old male returns the office today for follow up evaluation of right foot plantar fasciitis. At this time he is requesting a steroid injection into the area. He states that he typically is relief for couple months with a steroid injections. He denies any recent injury or trauma to bilateral lower extremities. States that he continues to have discomfort to the bottom of his heel on the right side securely after standing and working all day and after periods of rest or in the mornings. Denies any recent swelling or increase in warmth or redness to the area. No other complaints at this time.  Denies any systemic complaints such as fevers, chills, nausea, vomiting.  Objective: AAO x3, NAD DP/PT pulses palpable bilaterally, CRT less than 3 seconds Protective sensation intact with Simms Weinstein monofilament, vibratory sensation intact, Achilles tendon reflex intact Tenderness to palpation overlying the plantar medial tubercle of the calcaneus at the insertion of the plantar fascia on the right foot. There is no pain on the course of the plantar fascia with the arch of the foot in the ligament appears to be intact. There is no pain with lateral compression of the calcaneus or pain with vibratory sensation. No pain on the posterior aspect of the calcaneus or along the course/insertion of the Achilles tendon. No other areas of pinpoint bony tenderness or pain with vibratory sensation to bilateral lower extremities. No overlying edema, erythema, increase in warmth bilaterally. MMT 5/5, ROM WNL No pain with calf compression, swelling, warmth, erythema. No open lesions or pre-ulcerative lesions identified.  Assessment: 49 year old male with right heel pain, plantar fasciitis  Plan: -Treatment options were discussed with the patient including alternatives, risks, complications. -Patient elects to proceed with  steroid injection into the right heel. Under sterile skin preparation, a total of 2.5cc of kenalog 10, 0.5% Marcaine plain, and 2% lidocaine plain were infiltrated into the symptomatic area without complication. A band-aid was applied. Patient tolerated the injection well without complication. Post-injection care with discussed with the patient. Discussed with the patient to ice the area over the next couple of days to help prevent a steroid flare.  -Plantar fascial taping was applied. -Continue stretching exercises and icing to the area. Continue shoe gear changes and inserts. -Follow-up in 4 weeks or sooner should any problems arise. In the meantime occurs to call the office with any questions, concerns, change in symptoms.

## 2014-05-21 ENCOUNTER — Ambulatory Visit: Payer: Medicaid Other | Admitting: Podiatry

## 2014-06-18 ENCOUNTER — Ambulatory Visit: Payer: Medicaid Other | Admitting: Podiatry

## 2014-07-02 ENCOUNTER — Ambulatory Visit: Payer: Medicaid Other | Admitting: Podiatry

## 2014-07-21 ENCOUNTER — Ambulatory Visit (INDEPENDENT_AMBULATORY_CARE_PROVIDER_SITE_OTHER): Payer: Medicaid Other | Admitting: Podiatry

## 2014-07-21 ENCOUNTER — Encounter: Payer: Self-pay | Admitting: Podiatry

## 2014-07-21 VITALS — BP 148/94 | HR 72 | Resp 18

## 2014-07-21 DIAGNOSIS — M722 Plantar fascial fibromatosis: Secondary | ICD-10-CM | POA: Diagnosis not present

## 2014-07-21 MED ORDER — TRIAMCINOLONE ACETONIDE 10 MG/ML IJ SUSP
10.0000 mg | Freq: Once | INTRAMUSCULAR | Status: AC
  Administered 2014-07-21: 10 mg

## 2014-07-21 NOTE — Patient Instructions (Signed)
Plantar Fasciitis (Heel Spur Syndrome) with Rehab The plantar fascia is a fibrous, ligament-like, soft-tissue structure that spans the bottom of the foot. Plantar fasciitis is a condition that causes pain in the foot due to inflammation of the tissue. SYMPTOMS   Pain and tenderness on the underneath side of the foot.  Pain that worsens with standing or walking. CAUSES  Plantar fasciitis is caused by irritation and injury to the plantar fascia on the underneath side of the foot. Common mechanisms of injury include:  Direct trauma to bottom of the foot.  Damage to a small nerve that runs under the foot where the main fascia attaches to the heel bone.  Stress placed on the plantar fascia due to bone spurs. RISK INCREASES WITH:   Activities that place stress on the plantar fascia (running, jumping, pivoting, or cutting).  Poor strength and flexibility.  Improperly fitted shoes.  Tight calf muscles.  Flat feet.  Failure to warm-up properly before activity.  Obesity. PREVENTION  Warm up and stretch properly before activity.  Allow for adequate recovery between workouts.  Maintain physical fitness:  Strength, flexibility, and endurance.  Cardiovascular fitness.  Maintain a health body weight.  Avoid stress on the plantar fascia.  Wear properly fitted shoes, including arch supports for individuals who have flat feet. PROGNOSIS  If treated properly, then the symptoms of plantar fasciitis usually resolve without surgery. However, occasionally surgery is necessary. RELATED COMPLICATIONS   Recurrent symptoms that may result in a chronic condition.  Problems of the lower back that are caused by compensating for the injury, such as limping.  Pain or weakness of the foot during push-off following surgery.  Chronic inflammation, scarring, and partial or complete fascia tear, occurring more often from repeated injections. TREATMENT  Treatment initially involves the use of  ice and medication to help reduce pain and inflammation. The use of strengthening and stretching exercises may help reduce pain with activity, especially stretches of the Achilles tendon. These exercises may be performed at home or with a therapist. Your caregiver may recommend that you use heel cups of arch supports to help reduce stress on the plantar fascia. Occasionally, corticosteroid injections are given to reduce inflammation. If symptoms persist for greater than 6 months despite non-surgical (conservative), then surgery may be recommended.  MEDICATION   If pain medication is necessary, then nonsteroidal anti-inflammatory medications, such as aspirin and ibuprofen, or other minor pain relievers, such as acetaminophen, are often recommended.  Do not take pain medication within 7 days before surgery.  Prescription pain relievers may be given if deemed necessary by your caregiver. Use only as directed and only as much as you need.  Corticosteroid injections may be given by your caregiver. These injections should be reserved for the most serious cases, because they may only be given a certain number of times. HEAT AND COLD  Cold treatment (icing) relieves pain and reduces inflammation. Cold treatment should be applied for 10 to 15 minutes every 2 to 3 hours for inflammation and pain and immediately after any activity that aggravates your symptoms. Use ice packs or massage the area with a piece of ice (ice massage).  Heat treatment may be used prior to performing the stretching and strengthening activities prescribed by your caregiver, physical therapist, or athletic trainer. Use a heat pack or soak the injury in warm water. SEEK IMMEDIATE MEDICAL CARE IF:  Treatment seems to offer no benefit, or the condition worsens.  Any medications produce adverse side effects. EXERCISES RANGE   OF MOTION (ROM) AND STRETCHING EXERCISES - Plantar Fasciitis (Heel Spur Syndrome) These exercises may help you  when beginning to rehabilitate your injury. Your symptoms may resolve with or without further involvement from your physician, physical therapist or athletic trainer. While completing these exercises, remember:   Restoring tissue flexibility helps normal motion to return to the joints. This allows healthier, less painful movement and activity.  An effective stretch should be held for at least 30 seconds.  A stretch should never be painful. You should only feel a gentle lengthening or release in the stretched tissue. RANGE OF MOTION - Toe Extension, Flexion  Sit with your right / left leg crossed over your opposite knee.  Grasp your toes and gently pull them back toward the top of your foot. You should feel a stretch on the bottom of your toes and/or foot.  Hold this stretch for __________ seconds.  Now, gently pull your toes toward the bottom of your foot. You should feel a stretch on the top of your toes and or foot.  Hold this stretch for __________ seconds. Repeat __________ times. Complete this stretch __________ times per day.  RANGE OF MOTION - Ankle Dorsiflexion, Active Assisted  Remove shoes and sit on a chair that is preferably not on a carpeted surface.  Place right / left foot under knee. Extend your opposite leg for support.  Keeping your heel down, slide your right / left foot back toward the chair until you feel a stretch at your ankle or calf. If you do not feel a stretch, slide your bottom forward to the edge of the chair, while still keeping your heel down.  Hold this stretch for __________ seconds. Repeat __________ times. Complete this stretch __________ times per day.  STRETCH - Gastroc, Standing  Place hands on wall.  Extend right / left leg, keeping the front knee somewhat bent.  Slightly point your toes inward on your back foot.  Keeping your right / left heel on the floor and your knee straight, shift your weight toward the wall, not allowing your back to  arch.  You should feel a gentle stretch in the right / left calf. Hold this position for __________ seconds. Repeat __________ times. Complete this stretch __________ times per day. STRETCH - Soleus, Standing  Place hands on wall.  Extend right / left leg, keeping the other knee somewhat bent.  Slightly point your toes inward on your back foot.  Keep your right / left heel on the floor, bend your back knee, and slightly shift your weight over the back leg so that you feel a gentle stretch deep in your back calf.  Hold this position for __________ seconds. Repeat __________ times. Complete this stretch __________ times per day. STRETCH - Gastrocsoleus, Standing  Note: This exercise can place a lot of stress on your foot and ankle. Please complete this exercise only if specifically instructed by your caregiver.   Place the ball of your right / left foot on a step, keeping your other foot firmly on the same step.  Hold on to the wall or a rail for balance.  Slowly lift your other foot, allowing your body weight to press your heel down over the edge of the step.  You should feel a stretch in your right / left calf.  Hold this position for __________ seconds.  Repeat this exercise with a slight bend in your right / left knee. Repeat __________ times. Complete this stretch __________ times per day.    STRENGTHENING EXERCISES - Plantar Fasciitis (Heel Spur Syndrome)  These exercises may help you when beginning to rehabilitate your injury. They may resolve your symptoms with or without further involvement from your physician, physical therapist or athletic trainer. While completing these exercises, remember:   Muscles can gain both the endurance and the strength needed for everyday activities through controlled exercises.  Complete these exercises as instructed by your physician, physical therapist or athletic trainer. Progress the resistance and repetitions only as guided. STRENGTH -  Towel Curls  Sit in a chair positioned on a non-carpeted surface.  Place your foot on a towel, keeping your heel on the floor.  Pull the towel toward your heel by only curling your toes. Keep your heel on the floor.  If instructed by your physician, physical therapist or athletic trainer, add ____________________ at the end of the towel. Repeat __________ times. Complete this exercise __________ times per day. STRENGTH - Ankle Inversion  Secure one end of a rubber exercise band/tubing to a fixed object (table, pole). Loop the other end around your foot just before your toes.  Place your fists between your knees. This will focus your strengthening at your ankle.  Slowly, pull your big toe up and in, making sure the band/tubing is positioned to resist the entire motion.  Hold this position for __________ seconds.  Have your muscles resist the band/tubing as it slowly pulls your foot back to the starting position. Repeat __________ times. Complete this exercises __________ times per day.  Document Released: 04/02/2005 Document Revised: 06/25/2011 Document Reviewed: 07/15/2008 [employer] Patient Information 2015 [employer]. This information is not intended to replace advice given to you by your health care provider. Make sure you discuss any questions you have with your health care provider.  

## 2014-07-25 ENCOUNTER — Encounter: Payer: Self-pay | Admitting: Podiatry

## 2014-07-25 NOTE — Progress Notes (Signed)
Patient ID: David Brooks, male   DOB: [birth year], 49 y.o.   MRN: [id number]  Subjective: 49 year old male returns the office today for follow-up evaluation of right heel pain. He states that he had the best month he's had a long time last month and was having no pain to his heel. He states that over the last week or so inserted goes reoccurrence of symptoms. He denies any history of injury or trauma to the area and denies any swelling or redness overlying the area. No tenderness. The pain does not wake him up at night. No other complaints at this time. No acute changes.  Objective: AAO x3, NAD DP/PT pulses palpable bilaterally, CRT less than 3 seconds Protective sensation intact with Simms Weinstein monofilament, vibratory sensation intact, Achilles tendon reflex intact There is tenderness to palpation overlying the plantar medial tubercle of the calcaneus to right heel at the insertion of the plantar fascia. There is no pain along the course of plantar fascia within the arch of the foot. The plantar fascia appears intact.There is no pain with lateral compression of the calcaneus or pain the vibratory sensation. No pain on the posterior aspect of the calcaneus or along the course/insertion of the Achilles tendon. There is no overlying edema, erythema, increase in warmth. No other areas of tenderness palpation or pain with vibratory sensation to the foot/ankle. MMT 5/5, ROM WNL No open lesions or pre-ulcerative lesions are identified. No pain with calf compression, swelling, warmth, erythema.  Assessment: 49 year old male with right heel pain, recurrent plantar fasciitis  Plan: -Patient elects to proceed with steroid injection into the right heel. Under sterile skin preparation, a total of 2.5cc of kenalog 10, 0.5% Marcaine plain, and 2% lidocaine plain were infiltrated into the symptomatic area without complication. A band-aid was applied. Patient tolerated the injection well without complication.  Post-injection care with discussed with the patient. Discussed with the patient to ice the area over the next couple of days to help prevent a steroid flare.  -Plantar fascial taking was applied -Continue ice and stretching. Continue shoe gear modifications. Follow up in 4 weeks or sooner if any problems are to arise. In the meantime encouraged to call the office with any question, concerns, change in symptoms.

## 2014-08-18 ENCOUNTER — Ambulatory Visit: Payer: Medicaid Other | Admitting: Podiatry

## 2014-09-01 ENCOUNTER — Ambulatory Visit: Payer: Medicaid Other | Admitting: Podiatry

## 2014-09-03 ENCOUNTER — Ambulatory Visit (INDEPENDENT_AMBULATORY_CARE_PROVIDER_SITE_OTHER): Payer: Medicaid Other | Admitting: Podiatry

## 2014-09-03 ENCOUNTER — Encounter: Payer: Self-pay | Admitting: Podiatry

## 2014-09-03 VITALS — BP 174/100 | HR 74 | Resp 12

## 2014-09-03 DIAGNOSIS — M722 Plantar fascial fibromatosis: Secondary | ICD-10-CM

## 2014-09-06 ENCOUNTER — Encounter: Payer: Self-pay | Admitting: Podiatry

## 2014-09-06 NOTE — Progress Notes (Signed)
Patient ID: Lovena LeWilliam A Yazdani, male   DOB: 1965/05/08, 49 y.o.   MRN: [id number]  Subjective: Mr. Christell ConstantMoore returns the office today for follow-up evaluation of right heel pain. Overall he is doing much better than what he was before. He does continue to have some pain first up in the morning which is relieved by ambulation. He denies any history of injury or trauma to the area and denies any swelling or redness overlying the area. No tenderness. The pain does not wake him up at night. No other complaints at this time. No acute changes.  Objective: AAO x3, NAD DP/PT pulses palpable bilaterally, CRT less than 3 seconds Protective sensation intact with Simms Weinstein monofilament, vibratory sensation intact, Achilles tendon reflex intact There is mild tenderness to palpation overlying the plantar medial tubercle of the calcaneus on right heel at the insertion of the plantar fascia. There is no pain along the course of plantar fascia within the arch of the foot. The plantar fascia appears intact.There is no pain with lateral compression of the calcaneus or pain the vibratory sensation. No pain on the posterior aspect of the calcaneus or along the course/insertion of the Achilles tendon. There is no overlying edema, erythema, increase in warmth. No pain to the contralateral extremity. No other areas of tenderness palpation or pain with vibratory sensation to the foot/ankle. MMT 5/5, ROM WNL No open lesions or pre-ulcerative lesions are identified. No pain with calf compression, swelling, warmth, erythema.  Assessment: 49 year old male with right heel pain, rcurrent plantar fasciitis  Plan: -Discussed steriod injection. He wishes to hold off another at this time.  -Plantar fascial taping was applied. And plantar fascial brace. He can start wearing the brace once the pain is removed. -Continue night splint.  -Continue ice and stretching. Continue shoe gear modifications.  -Follow up in 4 weeks if symptoms  have not resolved or sooner if any problems are to arise. In the meantime encouraged to call the office with any question, concerns, change in symptoms.

## 2014-10-11 ENCOUNTER — Other Ambulatory Visit: Payer: Self-pay

## 2015-02-24 ENCOUNTER — Emergency Department (HOSPITAL_COMMUNITY)
Admission: EM | Admit: 2015-02-24 | Discharge: 2015-02-24 | Disposition: A | Discharge status: Home / Self Care | Payer: Medicaid Other | Attending: Emergency Medicine | Admitting: Emergency Medicine

## 2015-02-24 ENCOUNTER — Encounter (HOSPITAL_COMMUNITY): Payer: Self-pay | Admitting: Emergency Medicine

## 2015-02-24 DIAGNOSIS — Z7982 Long term (current) use of aspirin: Secondary | ICD-10-CM | POA: Diagnosis not present

## 2015-02-24 DIAGNOSIS — Z880 Allergy status to penicillin: Secondary | ICD-10-CM | POA: Diagnosis not present

## 2015-02-24 DIAGNOSIS — Z79899 Other long term (current) drug therapy: Secondary | ICD-10-CM | POA: Insufficient documentation

## 2015-02-24 DIAGNOSIS — I10 Essential (primary) hypertension: Secondary | ICD-10-CM | POA: Diagnosis not present

## 2015-02-24 DIAGNOSIS — R066 Hiccough: Secondary | ICD-10-CM | POA: Diagnosis present

## 2015-02-24 DIAGNOSIS — Z87828 Personal history of other (healed) physical injury and trauma: Secondary | ICD-10-CM | POA: Diagnosis not present

## 2015-02-24 DIAGNOSIS — E119 Type 2 diabetes mellitus without complications: Secondary | ICD-10-CM | POA: Diagnosis not present

## 2015-02-24 MED ORDER — BACLOFEN 10 MG PO TABS
10.0000 mg | ORAL_TABLET | Freq: Once | ORAL | Status: AC
  Administered 2015-02-24: 10 mg via ORAL
  Filled 2015-02-24: qty 1

## 2015-02-24 MED ORDER — BACLOFEN 10 MG PO TABS: ORAL_TABLET | ORAL | Status: DC

## 2015-02-24 NOTE — Discharge Instructions (Signed)
Hiccups °A hiccup is the result of a sudden shortening of the muscle below your lungs (diaphragm). This movement of your diaphragm causes a sudden inhalation followed by the closing of your vocal cords, which causes the hiccup sound. Most people get the hiccups. Typically, hiccups last only a short amount of time.  °There are three types of hiccups:  °· Benign. These hiccups last less than 48 hours.   °· Persistent. These hiccups last more than 48 hours, but less than 1 month.   °· Intractable. These hiccups last more than 1 month.   °A hiccup is a reflex. You cannot control reflexes.  °HOME CARE INSTRUCTIONS  °Watch your hiccups for any changes. The following actions may help to lessen any discomfort that you are feeling: °· Eat small meals.   °· Limit alcohol intake to no more than 1 drink per day for nonpregnant women and 2 drinks per day for men. One drink equals 12 oz of beer, 5 oz of wine, or 1½ oz of hard liquor. °· Limit drinking carbonated or fizzy drinks, such as soda. °· Eat and chew your food slowly.   °· Avoid eating or drinking hot or spicy foods and drinks. °· Take medicines only as directed by your health care provider.   °SEEK MEDICAL CARE IF:  °· Your hiccups last for more than 48 hours.   °· Your hiccups do not improve with treatment. °· You cannot sleep or eat due to the hiccups.   °· You have unexpected weight loss due to the hiccups.   °· You have a fever.   °· You have trouble breathing or swallowing.   °· You develop severe pain in your abdomen. °· You develop numbness, tingling, or weakness. °  °This information is not intended to replace advice given to you by your health care provider. Make sure you discuss any questions you have with your health care provider. °  °Document Released: 06/11/2001 Document Revised: 08/17/2014 Document Reviewed: 03/29/2014 °Elsevier Interactive Patient Education ©2016 [employer] ° °

## 2015-02-24 NOTE — ED Notes (Signed)
Patient complaining of hiccups x 3 days.

## 2015-02-24 NOTE — ED Provider Notes (Signed)
CSN: [id number]     Arrival date & time 02/24/15  1622 History   First MD Initiated Contact with Patient 02/24/15 1704     Chief Complaint  Patient presents with  . Hiccups     (Consider location/radiation/quality/duration/timing/severity/associated sxs/prior Treatment) The history is provided by the patient and the spouse.   David Brooks is a 49 y.o. male presenting with fairly persistent hiccups for the past 3 days.  He has taken antacids although denies any recent problems with heartburn or acidi reflux and also tried drinking ice water without relief of symptoms.  He denies chest pain and shortness of breath.  He endorses having an episode of similar symptoms about 5 years ago and was treated with thorazine which worked, but made him very drowsy and would prefer an alternative treatment if possible.    Past Medical History  Diagnosis Date  . Hypertension   . Rotator cuff (capsule) sprain   . Diabetes mellitus without complication (HCC)    History reviewed. No pertinent past surgical history. Family History  Problem Relation Age of Onset  . Hypertension Mother   . Hyperlipidemia Mother   . Cancer Father     throat   Social History  Substance Use Topics  . Smoking status: Never Smoker   . Smokeless tobacco: Never Used  . Alcohol Use: No    Review of Systems  Constitutional: Negative for fever.  HENT: Negative for congestion and sore throat.   Eyes: Negative.   Respiratory: Negative for cough, chest tightness, shortness of breath, wheezing and stridor.        Negative except as mentioned in HPI.    Cardiovascular: Negative for chest pain.  Gastrointestinal: Negative for nausea and abdominal pain.  Genitourinary: Negative.   Musculoskeletal: Negative for joint swelling, arthralgias and neck pain.  Skin: Negative.  Negative for rash and wound.  Neurological: Negative for dizziness, weakness, light-headedness, numbness and headaches.  Psychiatric/Behavioral:  Negative.       Allergies  Penicillins; Shrimp; and Tramadol  Home Medications   Prior to Admission medications   Medication Sig Start Date End Date Taking? Authorizing Provider  amLODipine (NORVASC) 10 MG tablet Take 10 mg by mouth daily.   Yes Historical Provider, MD  aspirin EC 81 MG tablet Take 81 mg by mouth daily.   Yes Historical Provider, MD  carvedilol (COREG) 12.5 MG tablet Take 12.5 mg by mouth daily.  02/22/11  Yes Historical Provider, MD  lisinopril-hydrochlorothiazide (PRINZIDE,ZESTORETIC) 10-12.5 MG per tablet Take 1 tablet by mouth daily.  02/22/11  Yes Historical Provider, MD  lovastatin (MEVACOR) 20 MG tablet Take 20 mg by mouth at bedtime.   Yes Historical Provider, MD  metFORMIN (GLUCOPHAGE) 500 MG tablet Take 500 mg by mouth daily with breakfast.   Yes Historical Provider, MD  baclofen (LIORESAL) 10 MG tablet Take 1-2 tablets PO every 8 hours prn hiccups 02/24/15   Burgess AmorJulie Caran Storck, PA-C  famotidine (PEPCID) 20 MG tablet Take 1 tablet (20 mg total) by mouth 2 (two) times daily. Patient not taking: Reported on 02/24/2015 04/13/14   Vanetta MuldersScott Zackowski, MD  ibuprofen (ADVIL,MOTRIN) 800 MG tablet Take 800 mg by mouth every 6 (six) hours as needed for moderate pain.    Historical Provider, MD  sildenafil (VIAGRA) 100 MG tablet Take 0.5-1 tablets (50-100 mg total) by mouth daily as needed for erectile dysfunction. Patient not taking: Reported on 02/24/2015 10/22/12   Deatra CanterWilliam J Oxford, FNP  tadalafil (CIALIS) 20 MG tablet Take 0.5-1 tablets (  10-20 mg total) by mouth every other day as needed for erectile dysfunction. Patient not taking: Reported on 02/24/2015 12/10/12   Deatra Canter, FNP  traMADol (ULTRAM) 50 MG tablet Take 1 tablet (50 mg total) by mouth every 6 (six) hours as needed. Patient not taking: Reported on 02/24/2015 04/13/14   Vanetta Mulders, MD   BP 126/77 mmHg  Pulse 84  Temp(Src) 98.3 F (36.8 C) (Oral)  Resp 18  Ht  (1.803 m)  Wt 330 lb (149.687 kg)  BMI  46.05 kg/m2  SpO2 100% Physical Exam  Constitutional: He appears well-developed and well-nourished.  HENT:  Head: Normocephalic and atraumatic.  Eyes: Conjunctivae are normal.  Neck: Neck supple.  Cardiovascular: Normal rate, regular rhythm and normal heart sounds.   Pulmonary/Chest: Effort normal and breath sounds normal. No respiratory distress. He has no wheezes.  Frequent hiccup.  Abdominal: Soft.  Musculoskeletal: Normal range of motion.  Neurological: He is alert.  Skin: Skin is warm and dry.  Psychiatric: He has a normal mood and affect.  Nursing note and vitals reviewed.   ED Course  Procedures (including critical care time)  Pt was given oxygen per nasal cannula at 4 L/m which did not improve symptoms.  He was also given baclofen 10 mg by mouth.  Switched nasal cannula to nonrebreather, high flow and within 2 minutes his pickup had resolved.  MDM   Final diagnoses:  Hiccups    Unclear if it was simply the high flow oxygen that resolved his hiccup episode or if the baclofen was starting also to be effective.  He does have a CPAP machine at home which he is not currently using, but he may try using this if his hiccups return, I also prescribed baclofen if needed.  Follow-up with PCP if symptoms persist or worsen.    Burgess Amor, PA-C 02/24/15 1837  Glynn Octave, MD 02/25/15 [phone]

## 2015-05-26 ENCOUNTER — Other Ambulatory Visit: Payer: Self-pay | Admitting: Orthopaedic Surgery

## 2015-05-26 ENCOUNTER — Telehealth: Payer: Self-pay

## 2015-05-26 MED ORDER — HYDROCODONE-ACETAMINOPHEN 7.5-325 MG PO TABS: 1.0000 | ORAL_TABLET | ORAL | Status: DC | PRN

## 2015-05-26 NOTE — Telephone Encounter (Signed)
Rx printed

## 2015-05-26 NOTE — Telephone Encounter (Signed)
Prescription available, patient aware  

## 2015-05-26 NOTE — Progress Notes (Signed)
Patient aware prescription ready for pick up

## 2015-05-26 NOTE — Telephone Encounter (Signed)
Patient called requesting norco refill.  Please advise.

## 2015-06-22 ENCOUNTER — Ambulatory Visit (INDEPENDENT_AMBULATORY_CARE_PROVIDER_SITE_OTHER): Payer: Medicaid Other | Admitting: Orthopaedic Surgery

## 2015-06-22 ENCOUNTER — Ambulatory Visit: Payer: Medicaid Other | Admitting: Orthopaedic Surgery

## 2015-06-22 ENCOUNTER — Encounter: Payer: Self-pay | Admitting: Orthopaedic Surgery

## 2015-06-22 VITALS — BP 154/97 | HR 71 | Temp 97.7°F | Resp 16 | Ht 70.5 in | Wt 324.0 lb

## 2015-06-22 DIAGNOSIS — M25511 Pain in right shoulder: Secondary | ICD-10-CM | POA: Diagnosis not present

## 2015-06-22 MED ORDER — HYDROCODONE-ACETAMINOPHEN 7.5-325 MG PO TABS: 1.0000 | ORAL_TABLET | ORAL | Status: DC | PRN

## 2015-06-22 NOTE — Progress Notes (Signed)
CC:  Right shoulder pain  Subjective:    Patient ID: David Brooks, male    DOB: Aug 30, 1965, 50 y.o.   MRN: [id number]  Shoulder Pain  The pain is present in the right shoulder. This is a chronic problem. The current episode started more than 1 year ago. The problem occurs daily. The problem has been waxing and waning. The quality of the pain is described as dull. The pain is at a severity of 3/10. The pain is mild. He has tried heat, cold, NSAIDS and rest for the symptoms. The treatment provided moderate relief.  He is doing his exercises.  He has more pain with overhead use and in cooler weather.  He has no paresthesias.      Review of Systems  Constitutional:       He has diabetes He has hypertension He has no COPD He does not smoke.  Musculoskeletal: Positive for myalgias and arthralgias.       Objective:   Physical Exam  Constitutional: He is oriented to person, place, and time. He appears well-developed and well-nourished.  HENT:  Head: Normocephalic and atraumatic.  Eyes: Conjunctivae and EOM are normal. Pupils are equal, round, and reactive to light.  Neck: Normal range of motion. Neck supple.  Cardiovascular: Normal rate, regular rhythm and intact distal pulses.   Pulmonary/Chest: Effort normal.  Abdominal: Soft.  Neurological: He is alert and oriented to person, place, and time. He has normal reflexes. He displays normal reflexes. No cranial nerve deficit. He exhibits normal muscle tone. Coordination normal.  Skin: Skin is warm and dry.  Psychiatric: He has a normal mood and affect. His behavior is normal. Judgment and thought content normal.      His shoulder on the right: Examination of right Upper Extremity is done.  Inspection:   Overall:  Elbow non-tender without crepitus or defects, forearm non-tender without crepitus or defects, wrist non-tender without crepitus or defects, hand non-tender.    Shoulder: with glenohumeral joint tenderness, without  effusion.   Upper arm: without swelling and tenderness   Range of motion:   Overall:  Full range of motion of the elbow, full range of motion of wrist and full range of motion in fingers.   Shoulder:  right  180 degrees forward flexion; 160 degrees abduction; 40 degrees internal rotation, 40 degrees external rotation, 25 degrees extension, 40 degrees adduction.   Stability:   Overall:  Shoulder, elbow and wrist stable   Strength and Tone:   Overall full shoulder muscles strength, full upper arm strength and normal upper arm bulk and tone.  His diabetes is well controlled.  He has lost a lot of weight recently. [facility].  His blood sugars are in the 103-105 range.  He is pleased he is doing well.  His hypertension is a little more difficult for him to control.  He has been working with his family doctor on this.  His diastolic is 97 today. He has no distal edema.  He really watches his salt intake.    Assessment & Plan:  Right shoulder pain.  Continue exercises.

## 2015-07-21 ENCOUNTER — Ambulatory Visit (INDEPENDENT_AMBULATORY_CARE_PROVIDER_SITE_OTHER): Payer: Medicaid Other | Admitting: Orthopaedic Surgery

## 2015-07-21 VITALS — BP 189/95 | HR 70 | Temp 97.7°F | Ht 71.0 in | Wt 324.0 lb

## 2015-07-21 DIAGNOSIS — M25562 Pain in left knee: Secondary | ICD-10-CM

## 2015-07-21 DIAGNOSIS — M25511 Pain in right shoulder: Secondary | ICD-10-CM | POA: Diagnosis not present

## 2015-07-21 DIAGNOSIS — M25561 Pain in right knee: Secondary | ICD-10-CM | POA: Diagnosis not present

## 2015-07-21 MED ORDER — HYDROCODONE-ACETAMINOPHEN 7.5-325 MG PO TABS: 1.0000 | ORAL_TABLET | ORAL | Status: DC | PRN

## 2015-07-21 NOTE — Progress Notes (Signed)
Patient [name], male DOB:1965/08/06, 50 y.o. VFI:[id number]  Chief Complaint  Patient presents with  . Shoulder Pain    right side    HPI  TYLEE Brooks is a 50 y.o. male who has chronic right shoulder pain.  He has had more pain with the rainy and cooler weather.  He has no trauma.  He has no paresthesias.  He has pain with overhead use and when sleeping.  He has been taking his medicine and doing his exercises which help.  He uses a cream which helps as well.  He has bilateral knee pain, more on the left at times.  It varies.  He has no giving way or locking.  HPI  Body mass index is 45.21 kg/(m^2).   Review of Systems  Constitutional:       He has diabetes He has hypertension He has no COPD He does not smoke.  HENT: Negative for congestion.   Respiratory: Negative for cough and shortness of breath.   Cardiovascular: Negative for chest pain and leg swelling.  Endocrine: Positive for cold intolerance.  Musculoskeletal: Positive for myalgias and arthralgias.  Allergic/Immunologic: Positive for environmental allergies.    Past Medical History  Diagnosis Date  . Hypertension   . Rotator cuff (capsule) sprain   . Diabetes mellitus without complication (HCC)     No past surgical history on file.  Family History  Problem Relation Age of Onset  . Hypertension Mother   . Hyperlipidemia Mother   . Cancer Father     throat    Social History Social History  Substance Use Topics  . Smoking status: Never Smoker   . Smokeless tobacco: Never Used  . Alcohol Use: No    Allergies  Allergen Reactions  . Penicillins Anaphylaxis  . Shrimp [Shellfish Allergy]     anaphylaxis  . Tramadol Rash    Possible combination with Zantac    Current Outpatient Prescriptions  Medication Sig Dispense Refill  . amLODipine (NORVASC) 10 MG tablet Take 10 mg by mouth daily.    Marland Kitchen aspirin EC 81 MG tablet Take 81 mg by mouth daily.    . carvedilol (COREG) 12.5 MG tablet Take  12.5 mg by mouth daily.     Marland Kitchen HYDROcodone-acetaminophen (NORCO) 7.5-325 MG tablet Take 1 tablet by mouth every 4 (four) hours as needed for moderate pain (Must last 30 days.  Do not drive or operate machinery while taking this medicine.). 150 tablet 0  . ibuprofen (ADVIL,MOTRIN) 800 MG tablet Take 800 mg by mouth every 6 (six) hours as needed for moderate pain.    Marland Kitchen lisinopril-hydrochlorothiazide (PRINZIDE,ZESTORETIC) 10-12.5 MG per tablet Take 1 tablet by mouth daily.     Marland Kitchen lovastatin (MEVACOR) 20 MG tablet Take 20 mg by mouth at bedtime.    . metFORMIN (GLUCOPHAGE) 500 MG tablet Take 500 mg by mouth daily with breakfast.     Current Facility-Administered Medications  Medication Dose Route Frequency Provider Last Rate Last Dose  . triamcinolone acetonide (KENALOG) 10 MG/ML injection 10 mg  10 mg Other Once Vivi Barrack, DPM         Physical Exam  Blood pressure 189/95, pulse 70, temperature 97.7 F (36.5 C), temperature source Tympanic, height  (1.803 m), weight 324 lb (146.965 kg).  Constitutional: overall normal hygiene, normal nutrition, well developed, normal grooming, normal body habitus. Assistive device:none  Musculoskeletal: gait and station Limp left, muscle tone and strength are normal, no tremors or atrophy is present.  Marland Kitchen  Neurological: coordination overall normal.  Deep tendon reflex/nerve stretch intact.  Sensation normal.  Cranial nerves II-XII intact.   Skin:   normal overall no scars, lesions, ulcers or rashes. No psoriasis.  Psychiatric: Alert and oriented x 3.  Recent memory intact, remote memory unclear.  Normal mood and affect. Well groomed.  Good eye contact.  Cardiovascular: overall no swelling, no varicosities, no edema bilaterally, normal temperatures of the legs and arms, no clubbing, cyanosis and good capillary refill.  Lymphatic: palpation is normal.  Examination of right Upper Extremity is done.  Inspection:   Overall:  Elbow non-tender  without crepitus or defects, forearm non-tender without crepitus or defects, wrist non-tender without crepitus or defects, hand non-tender.    Shoulder: with glenohumeral joint tenderness, without effusion.   Upper arm: without swelling and tenderness   Range of motion:   Overall:  Full range of motion of the elbow, full range of motion of wrist and full range of motion in fingers.   Shoulder:  right  160 degrees forward flexion; 145 degrees abduction; 35 degrees internal rotation, 35 degrees external rotation, 20 degrees extension, 40 degrees adduction.   Stability:   Overall:  Shoulder, elbow and wrist stable   Strength and Tone:   Overall full shoulder muscles strength, full upper arm strength and normal upper arm bulk and tone.  The left lower extremity is examined:  Inspection:  Thigh:  Non-tender and no defects  Knee has swelling 1+ effusion.                        Joint tenderness is present                        Patient is tender over the medial joint line  Lower Leg:  Has normal appearance and no tenderness or defects  Ankle:  Non-tender and no defects  Foot:  Non-tender and no defects Range of Motion:  Knee:  Range of motion is: 0-105                        Crepitus is  present  Ankle:  Range of motion is normal. Strength and Tone:  The left lower extremity has normal strength and tone. Stability:  Knee:  The knee is stable.  Ankle:  The ankle is stable.  The patient has been educated about the nature of the problem(s) and counseled on treatment options.  The patient appeared to understand what I have discussed and is in agreement with it.  Encounter Diagnoses  Name Primary?  . Right shoulder pain Yes  . Bilateral knee pain     PLAN Call if any problems.  Precautions discussed.  Continue current medications.   Return to clinic one month.  New Rx given for pain.

## 2015-08-18 ENCOUNTER — Ambulatory Visit (INDEPENDENT_AMBULATORY_CARE_PROVIDER_SITE_OTHER): Payer: Medicaid Other | Admitting: Orthopaedic Surgery

## 2015-08-18 ENCOUNTER — Encounter: Payer: Self-pay | Admitting: Orthopaedic Surgery

## 2015-08-18 VITALS — BP 153/83 | HR 82 | Temp 97.7°F | Ht 71.0 in | Wt 324.0 lb

## 2015-08-18 DIAGNOSIS — M25511 Pain in right shoulder: Secondary | ICD-10-CM | POA: Diagnosis not present

## 2015-08-18 DIAGNOSIS — M25561 Pain in right knee: Secondary | ICD-10-CM | POA: Diagnosis not present

## 2015-08-18 DIAGNOSIS — M25562 Pain in left knee: Secondary | ICD-10-CM | POA: Diagnosis not present

## 2015-08-18 MED ORDER — HYDROCODONE-ACETAMINOPHEN 7.5-325 MG PO TABS: 1.0000 | ORAL_TABLET | ORAL | Status: DC | PRN

## 2015-08-18 NOTE — Progress Notes (Signed)
Patient QI:ONGEXBM:David Brooks, male DOB:08-Nov-1965, 50 y.o. David Brooks  Chief Complaint  Patient presents with  . Follow-up    Right shoulder    HPI  David LeWilliam A Huhn is a 50 y.o. male who has chronic pain of the right shoulder.  He has no new trauma.  He has some more pain in the posterior area of the shoulder.  He has no redness or paresthesias.  He has been doing his exercises and taking his pain medicine.  He is working full time.  He has more pain at the end of his work day.  He uses ice that helps.  HPI  Body mass index is 45.21 kg/(m^2).  Review of Systems  Constitutional:       He has diabetes He has hypertension He has no COPD He does not smoke.  HENT: Negative for congestion.   Respiratory: Negative for cough and shortness of breath.   Cardiovascular: Negative for chest pain and leg swelling.  Endocrine: Positive for cold intolerance.  Musculoskeletal: Positive for myalgias and arthralgias.  Allergic/Immunologic: Positive for environmental allergies.    Past Medical History  Diagnosis Date  . Hypertension   . Rotator cuff (capsule) sprain   . Diabetes mellitus without complication (HCC)     History reviewed. No pertinent past surgical history.  Family History  Problem Relation Age of Onset  . Hypertension Mother   . Hyperlipidemia Mother   . Cancer Father     throat    Social History Social History  Substance Use Topics  . Smoking status: Never Smoker   . Smokeless tobacco: Never Used  . Alcohol Use: No    Allergies  Allergen Reactions  . Penicillins Anaphylaxis  . Shrimp [Shellfish Allergy]     anaphylaxis  . Tramadol Rash    Possible combination with Zantac    Current Outpatient Prescriptions  Medication Sig Dispense Refill  . amLODipine (NORVASC) 10 MG tablet Take 10 mg by mouth daily.    Marland Kitchen. aspirin EC 81 MG tablet Take 81 mg by mouth daily.    . carvedilol (COREG) 12.5 MG tablet Take 12.5 mg by mouth daily.     Marland Kitchen. HYDROcodone-acetaminophen  (NORCO) 7.5-325 MG tablet Take 1 tablet by mouth every 4 (four) hours as needed for moderate pain (Must last 30 days.  Do not drive or operate machinery while taking this medicine.). 150 tablet 0  . ibuprofen (ADVIL,MOTRIN) 800 MG tablet Take 800 mg by mouth every 6 (six) hours as needed for moderate pain.    Marland Kitchen. lisinopril-hydrochlorothiazide (PRINZIDE,ZESTORETIC) 10-12.5 MG per tablet Take 1 tablet by mouth daily.     Marland Kitchen. lovastatin (MEVACOR) 20 MG tablet Take 20 mg by mouth at bedtime.    . metFORMIN (GLUCOPHAGE) 500 MG tablet Take 500 mg by mouth daily with breakfast.     Current Facility-Administered Medications  Medication Dose Route Frequency Provider Last Rate Last Dose  . triamcinolone acetonide (KENALOG) 10 MG/ML injection 10 mg  10 mg Other Once Vivi BarrackMatthew R Wagoner, DPM         Physical Exam  Blood pressure 153/83, pulse 82, temperature 97.7 F (36.5 C), height 5\' 11"  (1.803 m), weight 324 lb (146.965 kg).  Constitutional: overall normal hygiene, normal nutrition, well developed, normal grooming, normal body habitus. Assistive device:none  Musculoskeletal: gait and station Limp none, muscle tone and strength are normal, no tremors or atrophy is present.  .  Neurological: coordination overall normal.  Deep tendon reflex/nerve stretch intact.  Sensation normal.  Cranial nerves II-XII intact.   Skin:   normal overall no scars, lesions, ulcers or rashes. No psoriasis.  Psychiatric: Alert and oriented x 3.  Recent memory intact, remote memory unclear.  Normal mood and affect. Well groomed.  Good eye contact.  Cardiovascular: overall no swelling, no varicosities, no edema bilaterally, normal temperatures of the legs and arms, no clubbing, cyanosis and good capillary refill.  Lymphatic: palpation is normal.  Examination of right Upper Extremity is done.  Inspection:   Overall:  Elbow non-tender without crepitus or defects, forearm non-tender without crepitus or defects, wrist  non-tender without crepitus or defects, hand non-tender.    Shoulder: with glenohumeral joint tenderness, without effusion.   Upper arm: without swelling and tenderness   Range of motion:   Overall:  Full range of motion of the elbow, full range of motion of wrist and full range of motion in fingers.   Shoulder:  right  180 degrees forward flexion; 160 degrees abduction; 35 degrees internal rotation, 35 degrees external rotation, 20 degrees extension, 40 degrees adduction.   Stability:   Overall:  Shoulder, elbow and wrist stable   Strength and Tone:   Overall full shoulder muscles strength, full upper arm strength and normal upper arm bulk and tone.  Left shoulder not tender with full motion.  The patient has been educated about the nature of the problem(s) and counseled on treatment options.  The patient appeared to understand what I have discussed and is in agreement with it.  Encounter Diagnoses  Name Primary?  . Right shoulder pain Yes  . Bilateral knee pain     PLAN Call if any problems.  Precautions discussed.  Continue current medications.   Return to clinic 2 months

## 2015-09-22 ENCOUNTER — Telehealth: Payer: Self-pay | Admitting: Orthopaedic Surgery

## 2015-09-22 MED ORDER — HYDROCODONE-ACETAMINOPHEN 7.5-325 MG PO TABS: 1.0000 | ORAL_TABLET | ORAL | Status: DC | PRN

## 2015-09-22 NOTE — Telephone Encounter (Signed)
Rx done. 

## 2015-09-22 NOTE — Telephone Encounter (Signed)
Patient requests refill on medication: HYDROcodone-acetaminophen (NORCO) 7.5-325 MG tablet [[id number]

## 2015-09-26 NOTE — Telephone Encounter (Signed)
I did this on 6-8.  If the Rx did not print or come through to you, please request again and let me know about it.

## 2015-09-26 NOTE — Telephone Encounter (Signed)
Patient's refill request was completed and patient did pick up the prescription Friday, 09/23/15.

## 2015-10-20 ENCOUNTER — Encounter: Payer: Self-pay | Admitting: Orthopaedic Surgery

## 2015-10-20 ENCOUNTER — Ambulatory Visit (INDEPENDENT_AMBULATORY_CARE_PROVIDER_SITE_OTHER): Payer: Medicaid Other | Admitting: Orthopaedic Surgery

## 2015-10-20 VITALS — BP 127/81 | HR 76 | Temp 97.0°F | Ht 71.0 in | Wt 320.2 lb

## 2015-10-20 DIAGNOSIS — M25511 Pain in right shoulder: Secondary | ICD-10-CM | POA: Diagnosis not present

## 2015-10-20 MED ORDER — HYDROCODONE-ACETAMINOPHEN 7.5-325 MG PO TABS: 1.0000 | ORAL_TABLET | ORAL | Status: DC | PRN

## 2015-10-20 MED ORDER — PREDNISONE 10 MG (21) PO TBPK: ORAL_TABLET | ORAL | Status: DC

## 2015-10-20 NOTE — Progress Notes (Signed)
Patient WU:JWJXBJY:David Brooks, male DOB:September 24, 1965, 50 y.o. [id number]  Chief Complaint  Patient presents with  . Follow-up    HPI  David Brooks is a 50 y.o. 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  He has more pani at the biceps insertion.  He has tried ice, heat, rest with only slight relief.  HPI  Body mass index is 44.69 kg/(m^2).  ROS  Review of Systems  Past Medical History  Diagnosis Date  . Hypertension   . Rotator cuff (capsule) sprain   . Diabetes mellitus without complication (HCC)     History reviewed. No pertinent past surgical history.  Family History  Problem Relation Age of Onset  . Hypertension Mother   . Hyperlipidemia Mother   . Cancer Father     throat    Social History Social History  Substance Use Topics  . Smoking status: Never Smoker   . Smokeless tobacco: Never Used  . Alcohol Use: No    Allergies  Allergen Reactions  . Penicillins Anaphylaxis  . Shrimp [Shellfish Allergy]     anaphylaxis  . Tramadol Rash    Possible combination with Zantac    Current Outpatient Prescriptions  Medication Sig Dispense Refill  . amLODipine (NORVASC) 10 MG tablet Take 10 mg by mouth daily.    Marland Kitchen. aspirin EC 81 MG tablet Take 81 mg by mouth daily.    . carvedilol (COREG) 12.5 MG tablet Take 12.5 mg by mouth daily.     Marland Kitchen. HYDROcodone-acetaminophen (NORCO) 7.5-325 MG tablet Take 1 tablet by mouth every 4 (four) hours as needed for moderate pain (Must last 30 days.  Do not drive or operate machinery while taking this medicine.). 150 tablet 0  . ibuprofen (ADVIL,MOTRIN) 800 MG tablet Take 800 mg by mouth every 6 (six) hours as needed for moderate pain.    Marland Kitchen. lisinopril-hydrochlorothiazide (PRINZIDE,ZESTORETIC) 10-12.5 MG per tablet Take 1 tablet by mouth daily.     Marland Kitchen. lovastatin (MEVACOR) 20 MG tablet Take 20 mg by mouth at bedtime.    . metFORMIN (GLUCOPHAGE) 500 MG tablet Take 500 mg  by mouth daily with breakfast.    . predniSONE (STERAPRED UNI-PAK 21 TAB) 10 MG (21) TBPK tablet Take six pills the first day;5 pills the next day;4 pills the next day;3 pills the next day; 2 pills the next day,one the final day. 21 tablet 1   Current Facility-Administered Medications  Medication Dose Route Frequency Provider Last Rate Last Dose  . triamcinolone acetonide (KENALOG) 10 MG/ML injection 10 mg  10 mg Other Once Vivi BarrackMatthew R Wagoner, DPM         Physical Exam  Blood pressure 127/81, pulse 76, temperature 97 F (36.1 C), height 5\' 11"  (1.803 m), weight 320 lb 4 oz (145.264 kg).  Constitutional: overall normal hygiene, normal nutrition, well developed, normal grooming, normal body habitus. Assistive device:none  Musculoskeletal: gait and station Limp none, muscle tone and strength are normal, no tremors or atrophy is present.  .  Neurological: coordination overall normal.  Deep tendon reflex/nerve stretch intact.  Sensation normal.  Cranial nerves II-XII intact.   Skin:   normal overall no scars, lesions, ulcers or rashes. No psoriasis.  Psychiatric: Alert and oriented x 3.  Recent memory intact, remote memory unclear.  Normal mood and affect. Well groomed.  Good eye contact.  Cardiovascular: overall no swelling, no varicosities, no edema bilaterally, normal temperatures of the  legs and arms, no clubbing, cyanosis and good capillary refill.  Lymphatic: palpation is normal.  Examination of right Upper Extremity is done.  Inspection:   Overall:  Elbow non-tender without crepitus or defects, forearm non-tender without crepitus or defects, wrist non-tender without crepitus or defects, hand non-tender.    Shoulder: with glenohumeral joint tenderness, without effusion.   Upper arm: without swelling and tenderness   Range of motion:   Overall:  Full range of motion of the elbow, full range of motion of wrist and full range of motion in fingers.   Shoulder:  right  150 degrees  forward flexion; 145 degrees abduction; 30 degrees internal rotation, 30 degrees external rotation, 10 degrees extension, 40 degrees adduction.   Stability:   Overall:  Shoulder, elbow and wrist stable   Strength and Tone:   Overall full shoulder muscles strength, full upper arm strength and normal upper arm bulk and tone.   The patient has been educated about the nature of the problem(s) and counseled on treatment options.  The patient appeared to understand what I have discussed and is in agreement with it.  Encounter Diagnosis  Name Primary?  . Right shoulder pain Yes    PLAN Call if any problems.  Precautions discussed.  Continue current medications.   Return to clinic 2 weeks   Electronically Signed Darreld McleanWayne Elenora Hawbaker, MD 7/6/20172:04 PM

## 2015-11-03 ENCOUNTER — Ambulatory Visit: Payer: Medicaid Other | Admitting: Orthopaedic Surgery

## 2015-11-09 ENCOUNTER — Ambulatory Visit: Payer: Medicaid Other | Admitting: Orthopaedic Surgery

## 2015-11-15 ENCOUNTER — Ambulatory Visit (INDEPENDENT_AMBULATORY_CARE_PROVIDER_SITE_OTHER): Payer: Medicaid Other | Admitting: Orthopaedic Surgery

## 2015-11-15 ENCOUNTER — Encounter: Payer: Self-pay | Admitting: Orthopaedic Surgery

## 2015-11-15 VITALS — BP 140/87 | HR 65 | Ht 69.75 in | Wt 328.0 lb

## 2015-11-15 DIAGNOSIS — M25511 Pain in right shoulder: Secondary | ICD-10-CM | POA: Diagnosis not present

## 2015-11-15 MED ORDER — HYDROCODONE-ACETAMINOPHEN 7.5-325 MG PO TABS: 1.0000 | ORAL_TABLET | ORAL | 0 refills | Status: DC | PRN

## 2015-11-15 NOTE — Progress Notes (Signed)
Patient [name], male DOB:1966-03-21, 50 y.o. EKC:[id number]  Chief Complaint  Patient presents with  . Follow-up    right shoulder pain+    HPI  David Brooks is a 50 y.o. male who has chronic right shoulder pain. He is having a good summer.  He has less pain in the shoulder, more motion and is sleeping well.  He has been taking his medicine and doing his exercises.  He has no paresthesias.   He has no new trauma. HPI  Body mass index is 47.4 kg/m.  ROS  Review of Systems  Constitutional:       He has diabetes He has hypertension He has no COPD He does not smoke.  HENT: Negative for congestion.   Respiratory: Negative for cough and shortness of breath.   Cardiovascular: Negative for chest pain and leg swelling.  Endocrine: Positive for cold intolerance.  Musculoskeletal: Positive for arthralgias and myalgias.  Allergic/Immunologic: Positive for environmental allergies.    Past Medical History:  Diagnosis Date  . Diabetes mellitus without complication (HCC)   . Hypertension   . Rotator cuff (capsule) sprain     No past surgical history on file.  Family History  Problem Relation Age of Onset  . Hypertension Mother   . Hyperlipidemia Mother   . Cancer Father     throat    Social History Social History  Substance Use Topics  . Smoking status: Never Smoker  . Smokeless tobacco: Never Used  . Alcohol use No    Allergies  Allergen Reactions  . Penicillins Anaphylaxis  . Shrimp [Shellfish Allergy]     anaphylaxis  . Tramadol Rash    Possible combination with Zantac    Current Outpatient Prescriptions  Medication Sig Dispense Refill  . amLODipine (NORVASC) 10 MG tablet Take 10 mg by mouth daily.    Marland Kitchen aspirin EC 81 MG tablet Take 81 mg by mouth daily.    . carvedilol (COREG) 12.5 MG tablet Take 12.5 mg by mouth daily.     Marland Kitchen HYDROcodone-acetaminophen (NORCO) 7.5-325 MG tablet Take 1 tablet by mouth every 4 (four) hours as needed for moderate  pain (Must last 30 days.  Do not drive or operate machinery while taking this medicine.). 150 tablet 0  . ibuprofen (ADVIL,MOTRIN) 800 MG tablet Take 800 mg by mouth every 6 (six) hours as needed for moderate pain.    Marland Kitchen lisinopril-hydrochlorothiazide (PRINZIDE,ZESTORETIC) 10-12.5 MG per tablet Take 1 tablet by mouth daily.     Marland Kitchen lovastatin (MEVACOR) 20 MG tablet Take 20 mg by mouth at bedtime.    . metFORMIN (GLUCOPHAGE) 500 MG tablet Take 500 mg by mouth daily with breakfast.    . predniSONE (STERAPRED UNI-PAK 21 TAB) 10 MG (21) TBPK tablet Take six pills the first day;5 pills the next day;4 pills the next day;3 pills the next day; 2 pills the next day,one the final day. 21 tablet 1   Current Facility-Administered Medications  Medication Dose Route Frequency Provider Last Rate Last Dose  . triamcinolone acetonide (KENALOG) 10 MG/ML injection 10 mg  10 mg Other Once Vivi Barrack, DPM         Physical Exam  Blood pressure 140/87, pulse 65, height 5' 9.75" (1.772 m), weight (!) 328 lb (148.8 kg).  Constitutional: overall normal hygiene, normal nutrition, well developed, normal grooming, normal body habitus. Assistive device:none  Musculoskeletal: gait and station Limp none, muscle tone and strength are normal, no tremors or atrophy is present.  Marland Kitchen  Neurological: coordination overall normal.  Deep tendon reflex/nerve stretch intact.  Sensation normal.  Cranial nerves II-XII intact.   Skin:   normal overall no scars, lesions, ulcers or rashes. No psoriasis.  Psychiatric: Alert and oriented x 3.  Recent memory intact, remote memory unclear.  Normal mood and affect. Well groomed.  Good eye contact.  Cardiovascular: overall no swelling, no varicosities, no edema bilaterally, normal temperatures of the legs and arms, no clubbing, cyanosis and good capillary refill.  Lymphatic: palpation is normal.  Examination of right Upper Extremity is done.  Inspection:   Overall:  Elbow non-tender  without crepitus or defects, forearm non-tender without crepitus or defects, wrist non-tender without crepitus or defects, hand non-tender.    Shoulder: with glenohumeral joint tenderness, without effusion.   Upper arm: without swelling and tenderness   Range of motion:   Overall:  Full range of motion of the elbow, full range of motion of wrist and full range of motion in fingers.   Shoulder:  right  180 degrees forward flexion; 165 degrees abduction; 40 degrees internal rotation, 40 degrees external rotation, 20 degrees extension, 40 degrees adduction.   Stability:   Overall:  Shoulder, elbow and wrist stable   Strength and Tone:   Overall full shoulder muscles strength, full upper arm strength and normal upper arm bulk and tone.   The patient has been educated about the nature of the problem(s) and counseled on treatment options.  The patient appeared to understand what I have discussed and is in agreement with it.  Encounter Diagnosis  Name Primary?  . Right shoulder pain Yes    PLAN Call if any problems.  Precautions discussed.  Continue current medications.   Return to clinic 3 months   Continue exercises.  Electronically Signed Darreld Mclean, MD 8/1/20171:35 PM

## 2015-12-05 ENCOUNTER — Ambulatory Visit (INDEPENDENT_AMBULATORY_CARE_PROVIDER_SITE_OTHER): Payer: Medicaid Other | Admitting: Podiatry

## 2015-12-05 ENCOUNTER — Encounter: Payer: Self-pay | Admitting: Podiatry

## 2015-12-05 DIAGNOSIS — G629 Polyneuropathy, unspecified: Secondary | ICD-10-CM

## 2015-12-05 MED ORDER — GABAPENTIN 100 MG PO CAPS: 100.0000 mg | ORAL_CAPSULE | Freq: Every day | ORAL | 3 refills | Status: DC

## 2015-12-05 NOTE — Patient Instructions (Signed)
Start gabapentin 100 mg at nighttime for 1 week On week 2 increased 200 mg gabapentin at night On week 3 increase to 300mg  gabapentin at night.  If you have any issues, please let me know.   Gabapentin capsules or tablets What is this medicine? GABAPENTIN (GA ba pen tin) is used to control partial seizures in adults with epilepsy. It is also used to treat certain types of nerve pain. This medicine may be used for other purposes; ask your health care provider or pharmacist if you have questions. What should I tell my health care provider before I take this medicine? They need to know if you have any of these conditions: -kidney disease -suicidal thoughts, plans, or attempt; a previous suicide attempt by you or a family member -an unusual or allergic reaction to gabapentin, other medicines, foods, dyes, or preservatives -pregnant or trying to get pregnant -breast-feeding How should I use this medicine? Take this medicine by mouth with a glass of water. Follow the directions on the prescription label. You can take it with or without food. If it upsets your stomach, take it with food.Take your medicine at regular intervals. Do not take it more often than directed. Do not stop taking except on your doctor's advice. If you are directed to break the 600 or 800 mg tablets in half as part of your dose, the extra half tablet should be used for the next dose. If you have not used the extra half tablet within 28 days, it should be thrown away. A special MedGuide will be given to you by the pharmacist with each prescription and refill. Be sure to read this information carefully each time. Talk to your pediatrician regarding the use of this medicine in children. Special care may be needed. Overdosage: If you think you have taken too much of this medicine contact a poison control center or emergency room at once. NOTE: This medicine is only for you. Do not share this medicine with others. What if I miss a  dose? If you miss a dose, take it as soon as you can. If it is almost time for your next dose, take only that dose. Do not take double or extra doses. What may interact with this medicine? Do not take this medicine with any of the following medications: -other gabapentin products This medicine may also interact with the following medications: -alcohol -antacids -antihistamines for allergy, cough and cold -certain medicines for anxiety or sleep -certain medicines for depression or psychotic disturbances -homatropine; hydrocodone -naproxen -narcotic medicines (opiates) for pain -phenothiazines like chlorpromazine, mesoridazine, prochlorperazine, thioridazine This list may not describe all possible interactions. Give your health care provider a list of all the medicines, herbs, non-prescription drugs, or dietary supplements you use. Also tell them if you smoke, drink alcohol, or use illegal drugs. Some items may interact with your medicine. What should I watch for while using this medicine? Visit your doctor or health care professional for regular checks on your progress. You may want to keep a record at home of how you feel your condition is responding to treatment. You may want to share this information with your doctor or health care professional at each visit. You should contact your doctor or health care professional if your seizures get worse or if you have any new types of seizures. Do not stop taking this medicine or any of your seizure medicines unless instructed by your doctor or health care professional. Stopping your medicine suddenly can increase your seizures or  their severity. Wear a medical identification bracelet or chain if you are taking this medicine for seizures, and carry a card that lists all your medications. You may get drowsy, dizzy, or have blurred vision. Do not drive, use machinery, or do anything that needs mental alertness until you know how this medicine affects you.  To reduce dizzy or fainting spells, do not sit or stand up quickly, especially if you are an older patient. Alcohol can increase drowsiness and dizziness. Avoid alcoholic drinks. Your mouth may get dry. Chewing sugarless gum or sucking hard candy, and drinking plenty of water will help. The use of this medicine may increase the chance of suicidal thoughts or actions. Pay special attention to how you are responding while on this medicine. Any worsening of mood, or thoughts of suicide or dying should be reported to your health care professional right away. Women who become pregnant while using this medicine may enroll in the [place] Antiepileptic Drug Pregnancy Registry by calling [phone]. This registry collects information about the safety of antiepileptic drug use during pregnancy. What side effects may I notice from receiving this medicine? Side effects that you should report to your doctor or health care professional as soon as possible: -allergic reactions like skin rash, itching or hives, swelling of the face, lips, or tongue -worsening of mood, thoughts or actions of suicide or dying Side effects that usually do not require medical attention (report to your doctor or health care professional if they continue or are bothersome): -constipation -difficulty walking or controlling muscle movements -dizziness -nausea -slurred speech -tiredness -tremors -weight gain This list may not describe all possible side effects. Call your doctor for medical advice about side effects. You may report side effects to FDA at 1-800-FDA-1088. Where should I keep my medicine? Keep out of reach of children. This medicine may cause accidental overdose and death if it taken by other adults, children, or pets. Mix any unused medicine with a substance like cat litter or coffee grounds. Then throw the medicine away in a sealed container like a sealed bag or a coffee can with a lid. Do not use the medicine  after the expiration date. Store at room temperature between 15 and 30 degrees C (59 and 86 degrees F). NOTE: This sheet is a summary. It may not cover all possible information. If you have questions about this medicine, talk to your doctor, pharmacist, or health care provider.    2016, Elsevier/Gold Standard. (2013-05-29 15:26:50)

## 2015-12-05 NOTE — Progress Notes (Signed)
Subjective: 50 year old male presents the office today for concerns of both of his feet sharp, burning pain which is been ongoing for about 1 year. He discuss this with his primary care physician was diagnosed with neuropathy however the pain has increased and he has not had any medications or any treatment for this previously. No recent injury or trauma to his feet denies he swelling or redness or warmth. Denies any systemic complaints such as fevers, chills, nausea, vomiting. No acute changes since last appointment, and no other complaints at this time.   Objective: AAO x3, NAD DP/PT pulses palpable bilaterally, pedal hair present, CRT less than 3 seconds Protective sensation intact with Simms Weinstein monofilament, vibratory sensation intact There is no area pinpoint tenderness or pain the vibratory sensation. There is no overlying edema, erythema, increase in warmth. No edema, erythema, increase in warmth to bilateral lower extremities.  No open lesions or pre-ulcerative lesions.  No pain with calf compression, swelling, warmth, erythema  Assessment: Neuropathy, symptomatic bilaterally  Plan: -All treatment options discussed with the patient including all alternatives, risks, complications.  -At this time a discussed with him treatment options to start medications for the pain to neuropathy. We will start gabapentin 100 mg at nighttime and titrate up as he can tolerate. Monitor for side effects.  -Patient encouraged to call the office with any questions, concerns, change in symptoms.   Ovid CurdMatthew Wagoner, DPM

## 2015-12-22 ENCOUNTER — Encounter: Payer: Self-pay | Admitting: Orthopaedic Surgery

## 2015-12-22 ENCOUNTER — Telehealth: Payer: Self-pay | Admitting: Orthopaedic Surgery

## 2015-12-22 MED ORDER — HYDROCODONE-ACETAMINOPHEN 7.5-325 MG PO TABS: ORAL_TABLET | ORAL | 0 refills | Status: DC

## 2015-12-22 NOTE — Telephone Encounter (Signed)
Hydrocodone-Acetaminophen  7.5/325mg   Qty  150 Tablets   Patient is Medicaid

## 2016-01-02 ENCOUNTER — Ambulatory Visit: Payer: Medicaid Other | Admitting: Podiatry

## 2016-01-05 ENCOUNTER — Telehealth: Payer: Self-pay | Admitting: Orthopaedic Surgery

## 2016-01-05 MED ORDER — HYDROCODONE-ACETAMINOPHEN 7.5-325 MG PO TABS: ORAL_TABLET | ORAL | 0 refills | Status: DC

## 2016-01-09 ENCOUNTER — Ambulatory Visit: Payer: Medicaid Other | Admitting: Podiatry

## 2016-01-19 ENCOUNTER — Other Ambulatory Visit: Payer: Self-pay | Admitting: *Deleted

## 2016-01-19 ENCOUNTER — Other Ambulatory Visit: Payer: Self-pay | Admitting: Orthopaedic Surgery

## 2016-01-19 MED ORDER — HYDROCODONE-ACETAMINOPHEN 7.5-325 MG PO TABS: ORAL_TABLET | ORAL | 0 refills | Status: DC

## 2016-01-20 ENCOUNTER — Ambulatory Visit: Payer: Medicaid Other | Admitting: Podiatry

## 2016-01-23 ENCOUNTER — Ambulatory Visit (INDEPENDENT_AMBULATORY_CARE_PROVIDER_SITE_OTHER): Payer: Medicaid Other | Admitting: Podiatry

## 2016-01-23 ENCOUNTER — Encounter: Payer: Self-pay | Admitting: Podiatry

## 2016-01-23 DIAGNOSIS — G629 Polyneuropathy, unspecified: Secondary | ICD-10-CM

## 2016-01-23 DIAGNOSIS — M722 Plantar fascial fibromatosis: Secondary | ICD-10-CM | POA: Diagnosis not present

## 2016-01-23 NOTE — Progress Notes (Signed)
Subjective: 50 year old male presents the office today for follow-up evaluation of bilateral foot pain. He was able to take gabapentin 2 pills at nighttime is causing headaches. He states of the right heel started become more painful. His vacation when the left side but not as bad as the right. Denies any recent injury or trauma. No swelling or redness. States the pain feels as he was at the previously in the injections to help. Denies any systemic complaints such as fevers, chills, nausea, vomiting. No acute changes since last appointment, and no other complaints at this time.   Objective: AAO x3, NAD DP/PT pulses palpable bilaterally, CRT less than 3 seconds Subjective still having some pins and needles to his feet. Overall sensation appears to be intact. Tenderness to palpation along the plantar medial tubercle of the calcaneus at the insertion of plantar fascia on the Right foot. There is no pain along the course of the plantar fascia within the arch of the foot. Plantar fascia appears to be intact. There is no pain with lateral compression of the calcaneus or pain with vibratory sensation. There is no pain along the course or insertion of the achilles tendon. No other areas of tenderness to bilateral lower extremities. No open lesions or pre-ulcerative lesions.  No pain with calf compression, swelling, warmth, erythema  Assessment: Neuropathy, right foot plantar fasciitis  Plan: -All treatment options discussed with the patient including all alternatives, risks, complications.  -This is appointment a mixture of [place] as well as local and the second was infiltrated into the right heel without complications. Post injection care was discussed. Plantar fascial tapings were applied bilaterally. Continue stretching, icing exercises daily. Discussed supportive shoe gear and orthotics. -Discussed when tried taking gabapentin 1 pill in the morning 1 pill at night to see how he does. He still has  side effects discontinue 1 pill a day. -Follow up in 9 weeks -Patient encouraged to call the office with any questions, concerns, change in symptoms.

## 2016-01-31 ENCOUNTER — Telehealth: Payer: Self-pay | Admitting: Orthopedic Surgery

## 2016-02-01 MED ORDER — HYDROCODONE-ACETAMINOPHEN 7.5-325 MG PO TABS: ORAL_TABLET | ORAL | 0 refills | Status: DC

## 2016-02-14 ENCOUNTER — Encounter: Payer: Self-pay | Admitting: Orthopaedic Surgery

## 2016-02-14 ENCOUNTER — Ambulatory Visit (INDEPENDENT_AMBULATORY_CARE_PROVIDER_SITE_OTHER): Payer: Medicaid Other | Admitting: Orthopaedic Surgery

## 2016-02-14 VITALS — BP 180/106 | HR 69 | Temp 98.1°F | Ht 69.75 in | Wt 329.0 lb

## 2016-02-14 DIAGNOSIS — M25511 Pain in right shoulder: Secondary | ICD-10-CM | POA: Diagnosis not present

## 2016-02-14 DIAGNOSIS — G8929 Other chronic pain: Secondary | ICD-10-CM | POA: Diagnosis not present

## 2016-02-14 DIAGNOSIS — M25512 Pain in left shoulder: Secondary | ICD-10-CM | POA: Diagnosis not present

## 2016-02-14 MED ORDER — HYDROCODONE-ACETAMINOPHEN 7.5-325 MG PO TABS: ORAL_TABLET | ORAL | 0 refills | Status: DC

## 2016-02-14 NOTE — Progress Notes (Signed)
Patient ZO:XWRUEAV:David Brooks, male DOB:1966/01/11, 50 y.o. WUJ:[id number]:David Brooks  Chief Complaint  Patient presents with  . Follow-up    right shoulder pain    HPI  David Brooks is a 50 y.o. male who has had chronic right shoulder pain has now developed chronic left shoulder pain.  He has been taking his medicine and doing his exercises.  He has no new trauma, no paresthesias. HPI  Body mass index is 47.55 kg/m.  ROS  Review of Systems  Constitutional:       He has diabetes He has hypertension He has no COPD He does not smoke.  HENT: Negative for congestion.   Respiratory: Negative for cough and shortness of breath.   Cardiovascular: Negative for chest pain and leg swelling.  Endocrine: Positive for cold intolerance.  Musculoskeletal: Positive for arthralgias and myalgias.  Allergic/Immunologic: Positive for environmental allergies.    Past Medical History:  Diagnosis Date  . Diabetes mellitus without complication (HCC)   . Hypertension   . Rotator cuff (capsule) sprain     No past surgical history on file.  Family History  Problem Relation Age of Onset  . Hypertension Mother   . Hyperlipidemia Mother   . Cancer Father     throat    Social History Social History  Substance Use Topics  . Smoking status: Never Smoker  . Smokeless tobacco: Never Used  . Alcohol use No    Allergies  Allergen Reactions  . Penicillins Anaphylaxis  . Shrimp [Shellfish Allergy]     anaphylaxis  . Tramadol Rash    Possible combination with Zantac    Current Outpatient Prescriptions  Medication Sig Dispense Refill  . amLODipine (NORVASC) 10 MG tablet Take 10 mg by mouth daily.    Marland Kitchen. aspirin EC 81 MG tablet Take 81 mg by mouth daily.    . carvedilol (COREG) 12.5 MG tablet Take 12.5 mg by mouth daily.     Marland Kitchen. gabapentin (NEURONTIN) 100 MG capsule Take 1 capsule (100 mg total) by mouth at bedtime. 90 capsule 3  . HYDROcodone-acetaminophen (NORCO) 7.5-325 MG tablet One every six hours  for pain as needed.  Do not drive car or operate machinery while taking this medicine.  Must last 14 days. 56 tablet 0  . ibuprofen (ADVIL,MOTRIN) 800 MG tablet Take 800 mg by mouth every 6 (six) hours as needed for moderate pain.    Marland Kitchen. lisinopril-hydrochlorothiazide (PRINZIDE,ZESTORETIC) 10-12.5 MG per tablet Take 1 tablet by mouth daily.     Marland Kitchen. lovastatin (MEVACOR) 20 MG tablet Take 20 mg by mouth at bedtime.    . metFORMIN (GLUCOPHAGE) 500 MG tablet Take 500 mg by mouth daily with breakfast.    . predniSONE (STERAPRED UNI-PAK 21 TAB) 10 MG (21) TBPK tablet Take six pills the first day;5 pills the next day;4 pills the next day;3 pills the next day; 2 pills the next day,one the final day. 21 tablet 1   Current Facility-Administered Medications  Medication Dose Route Frequency Provider Last Rate Last Dose  . triamcinolone acetonide (KENALOG) 10 MG/ML injection 10 mg  10 mg Other Once Vivi BarrackMatthew R Wagoner, DPM         Physical Exam  Blood pressure (!) 180/106, pulse 69, temperature 98.1 F (36.7 C), height 5' 9.75" (1.772 m), weight (!) 329 lb (149.2 kg).  Constitutional: overall normal hygiene, normal nutrition, well developed, normal grooming, normal body habitus. Assistive device:none  Musculoskeletal: gait and station Limp none, muscle tone and strength are normal, no  tremors or atrophy is present.  .  Neurological: coordination overall normal.  Deep tendon reflex/nerve stretch intact.  Sensation normal.  Cranial nerves II-XII intact.   Skin:   Normal overall no scars, lesions, ulcers or rashes. No psoriasis.  Psychiatric: Alert and oriented x 3.  Recent memory intact, remote memory unclear.  Normal mood and affect. Well groomed.  Good eye contact.  Cardiovascular: overall no swelling, no varicosities, no edema bilaterally, normal temperatures of the legs and arms, no clubbing, cyanosis and good capillary refill.  Lymphatic: palpation is normal.  Examination of right Upper Extremity is  done.  Inspection:   Overall:  Elbow non-tender without crepitus or defects, forearm non-tender without crepitus or defects, wrist non-tender without crepitus or defects, hand non-tender.    Shoulder: with glenohumeral joint tenderness, without effusion.   Upper arm: without swelling and tenderness   Range of motion:   Overall:  Full range of motion of the elbow, full range of motion of wrist and full range of motion in fingers.   Shoulder:  bilaterally  165 degrees forward flexion; 145 degrees abduction; 35 degrees internal rotation, 35 degrees external rotation, 15 degrees extension, 40 degrees adduction.   Stability:   Overall:  Shoulder, elbow and wrist stable   Strength and Tone:   Overall full shoulder muscles strength, full upper arm strength and normal upper arm bulk and tone.   The patient has been educated about the nature of the problem(s) and counseled on treatment options.  The patient appeared to understand what I have discussed and is in agreement with it.  Encounter Diagnoses  Name Primary?  . Chronic right shoulder pain Yes  . Chronic left shoulder pain     PLAN Call if any problems.  Precautions discussed.  Continue current medications.   Return to clinic 3 months   Electronically Signed Darreld McleanWayne Kerrigan Gombos, MD 10/31/20172:05 PM

## 2016-02-15 ENCOUNTER — Ambulatory Visit: Payer: Medicaid Other | Admitting: Orthopaedic Surgery

## 2016-02-28 ENCOUNTER — Other Ambulatory Visit: Payer: Self-pay | Admitting: Orthopaedic Surgery

## 2016-02-28 MED ORDER — HYDROCODONE-ACETAMINOPHEN 7.5-325 MG PO TABS: ORAL_TABLET | ORAL | 0 refills | Status: DC

## 2016-03-12 ENCOUNTER — Other Ambulatory Visit: Payer: Self-pay | Admitting: Orthopedic Surgery

## 2016-03-14 ENCOUNTER — Telehealth: Payer: Self-pay | Admitting: Orthopaedic Surgery

## 2016-03-14 MED ORDER — HYDROCODONE-ACETAMINOPHEN 7.5-325 MG PO TABS: ORAL_TABLET | ORAL | 0 refills | Status: DC

## 2016-03-14 NOTE — Telephone Encounter (Signed)
Hydrocodone-Acetaminophen  7.5/325mg  Qty 56 Tablets °

## 2016-03-26 ENCOUNTER — Ambulatory Visit: Payer: Medicaid Other | Admitting: Podiatry

## 2016-03-27 ENCOUNTER — Telehealth: Payer: Self-pay | Admitting: Orthopaedic Surgery

## 2016-03-28 MED ORDER — HYDROCODONE-ACETAMINOPHEN 7.5-325 MG PO TABS: ORAL_TABLET | ORAL | 0 refills | Status: DC

## 2016-04-03 ENCOUNTER — Telehealth: Payer: Self-pay | Admitting: Orthopaedic Surgery

## 2016-04-03 MED ORDER — HYDROCODONE-ACETAMINOPHEN 7.5-325 MG PO TABS: ORAL_TABLET | ORAL | 0 refills | Status: DC

## 2016-04-03 NOTE — Telephone Encounter (Signed)
Hydrocodone-Acetaminophen  7.5/325mg  Qty 56 Tablets °

## 2016-04-25 ENCOUNTER — Other Ambulatory Visit: Payer: Self-pay | Admitting: Orthopaedic Surgery

## 2016-04-26 ENCOUNTER — Other Ambulatory Visit: Payer: Self-pay | Admitting: *Deleted

## 2016-04-26 MED ORDER — HYDROCODONE-ACETAMINOPHEN 7.5-325 MG PO TABS: ORAL_TABLET | ORAL | 0 refills | Status: DC

## 2016-04-27 ENCOUNTER — Encounter: Payer: Self-pay | Admitting: Orthopedic Surgery

## 2016-04-27 NOTE — Progress Notes (Signed)
[place] controlled substance reporting system reviewed  

## 2016-05-02 ENCOUNTER — Telehealth: Payer: Self-pay | Admitting: Orthopedic Surgery

## 2016-05-03 ENCOUNTER — Ambulatory Visit: Payer: Medicaid Other | Admitting: Podiatry

## 2016-05-04 MED ORDER — HYDROCODONE-ACETAMINOPHEN 7.5-325 MG PO TABS: ORAL_TABLET | ORAL | 0 refills | Status: DC

## 2016-05-07 ENCOUNTER — Ambulatory Visit: Payer: Medicaid Other | Admitting: Podiatry

## 2016-05-09 ENCOUNTER — Telehealth: Payer: Self-pay | Admitting: Orthopaedic Surgery

## 2016-05-09 MED ORDER — HYDROCODONE-ACETAMINOPHEN 7.5-325 MG PO TABS: ORAL_TABLET | ORAL | 0 refills | Status: DC

## 2016-05-11 ENCOUNTER — Ambulatory Visit: Payer: Medicaid Other | Admitting: Podiatry

## 2016-05-16 ENCOUNTER — Ambulatory Visit (INDEPENDENT_AMBULATORY_CARE_PROVIDER_SITE_OTHER): Payer: Medicaid Other | Admitting: Orthopaedic Surgery

## 2016-05-16 ENCOUNTER — Encounter: Payer: Self-pay | Admitting: Orthopaedic Surgery

## 2016-05-16 ENCOUNTER — Ambulatory Visit (INDEPENDENT_AMBULATORY_CARE_PROVIDER_SITE_OTHER): Payer: Medicaid Other

## 2016-05-16 VITALS — BP 188/117 | HR 91 | Temp 97.9°F | Ht 69.75 in | Wt 333.0 lb

## 2016-05-16 DIAGNOSIS — M25512 Pain in left shoulder: Secondary | ICD-10-CM

## 2016-05-16 DIAGNOSIS — M25511 Pain in right shoulder: Secondary | ICD-10-CM | POA: Diagnosis not present

## 2016-05-16 DIAGNOSIS — G8929 Other chronic pain: Secondary | ICD-10-CM

## 2016-05-16 NOTE — Progress Notes (Signed)
Patient GE:XBMWUXL:David Brooks, male DOB:06/18/1965, 51 y.o. KGM:[id number]  Chief Complaint  Patient presents with  . Follow-up    Bilateral shoulder pain    HPI  David Brooks is a 51 y.o. male who has had chronic right shoulder pain.  That has done well but he has developed pain in the left shoulder now.  He has pain with overhead use, rolling on it at night.  He has more pain with the cold weather.  He has tried ice, heat, pain medicine and his NSAID.  He is not better. He has no numbness.  He has no trauma.  He has no redness. HPI  Body mass index is 48.12 kg/m.  ROS  Review of Systems  Constitutional:       He has diabetes He has hypertension He has no COPD He does not smoke.  HENT: Negative for congestion.   Respiratory: Negative for cough and shortness of breath.   Cardiovascular: Negative for chest pain and leg swelling.  Endocrine: Positive for cold intolerance.  Musculoskeletal: Positive for arthralgias and myalgias.  Allergic/Immunologic: Positive for environmental allergies.    Past Medical History:  Diagnosis Date  . Diabetes mellitus without complication (HCC)   . Hypertension   . Rotator cuff (capsule) sprain     No past surgical history on file.  Family History  Problem Relation Age of Onset  . Hypertension Mother   . Hyperlipidemia Mother   . Cancer Father     throat    Social History Social History  Substance Use Topics  . Smoking status: Never Smoker  . Smokeless tobacco: Never Used  . Alcohol use No    Allergies  Allergen Reactions  . Penicillins Anaphylaxis  . Shrimp [Shellfish Allergy]     anaphylaxis  . Tramadol Rash    Possible combination with Zantac    Current Outpatient Prescriptions  Medication Sig Dispense Refill  . amLODipine (NORVASC) 10 MG tablet Take 10 mg by mouth daily.    Marland Kitchen. aspirin EC 81 MG tablet Take 81 mg by mouth daily.    . carvedilol (COREG) 12.5 MG tablet Take 12.5 mg by mouth daily.     Marland Kitchen. gabapentin  (NEURONTIN) 100 MG capsule Take 1 capsule (100 mg total) by mouth at bedtime. 90 capsule 3  . HYDROcodone-acetaminophen (NORCO) 7.5-325 MG tablet One every six hours as needed for pain.  Seven day limit per Medicaid guidelines. 28 tablet 0  . ibuprofen (ADVIL,MOTRIN) 800 MG tablet Take 800 mg by mouth every 6 (six) hours as needed for moderate pain.    Marland Kitchen. lisinopril-hydrochlorothiazide (PRINZIDE,ZESTORETIC) 10-12.5 MG per tablet Take 1 tablet by mouth daily.     Marland Kitchen. lovastatin (MEVACOR) 20 MG tablet Take 20 mg by mouth at bedtime.    . metFORMIN (GLUCOPHAGE) 500 MG tablet Take 500 mg by mouth daily with breakfast.    . predniSONE (STERAPRED UNI-PAK 21 TAB) 10 MG (21) TBPK tablet Take six pills the first day;5 pills the next day;4 pills the next day;3 pills the next day; 2 pills the next day,one the final day. 21 tablet 1   Current Facility-Administered Medications  Medication Dose Route Frequency Provider Last Rate Last Dose  . triamcinolone acetonide (KENALOG) 10 MG/ML injection 10 mg  10 mg Other Once Vivi BarrackMatthew R Wagoner, DPM         Physical Exam  Blood pressure (!) 188/117, pulse 91, temperature 97.9 F (36.6 C), height 5' 9.75" (1.772 m), weight (!) 333 lb (151 kg).  Constitutional: overall normal hygiene, normal nutrition, well developed, normal grooming, normal body habitus. Assistive device:none  Musculoskeletal: gait and station Limp none, muscle tone and strength are normal, no tremors or atrophy is present.  .  Neurological: coordination overall normal.  Deep tendon reflex/nerve stretch intact.  Sensation normal.  Cranial nerves II-XII intact.   Skin:   Normal overall no scars, lesions, ulcers or rashes. No psoriasis.  Psychiatric: Alert and oriented x 3.  Recent memory intact, remote memory unclear.  Normal mood and affect. Well groomed.  Good eye contact.  Cardiovascular: overall no swelling, no varicosities, no edema bilaterally, normal temperatures of the legs and arms, no  clubbing, cyanosis and good capillary refill.  Lymphatic: palpation is normal.  Examination of left Upper Extremity is done.  Inspection:   Overall:  Elbow non-tender without crepitus or defects, forearm non-tender without crepitus or defects, wrist non-tender without crepitus or defects, hand non-tender.    Shoulder: with glenohumeral joint tenderness, without effusion.   Upper arm: without swelling and tenderness   Range of motion:   Overall:  Full range of motion of the elbow, full range of motion of wrist and full range of motion in fingers.   Shoulder:  left  165 degrees forward flexion; 120 degrees abduction; 30 degrees internal rotation, 30 degrees external rotation, 15 degrees extension, 40 degrees adduction.   Stability:   Overall:  Shoulder, elbow and wrist stable   Strength and Tone:   Overall full shoulder muscles strength, full upper arm strength and normal upper arm bulk and tone.   The patient has been educated about the nature of the problem(s) and counseled on treatment options.  The patient appeared to understand what I have discussed and is in agreement with it.  Encounter Diagnosis  Name Primary?  . Acute pain of left shoulder Yes    PLAN Call if any problems.  Precautions discussed.  Continue current medications.   Return to clinic 1 month   Electronically Signed Darreld Mclean, MD 1/31/20182:35 PM

## 2016-05-18 ENCOUNTER — Encounter: Payer: Self-pay | Admitting: Podiatry

## 2016-05-18 ENCOUNTER — Ambulatory Visit (INDEPENDENT_AMBULATORY_CARE_PROVIDER_SITE_OTHER): Payer: Medicaid Other | Admitting: Podiatry

## 2016-05-18 DIAGNOSIS — M722 Plantar fascial fibromatosis: Secondary | ICD-10-CM | POA: Diagnosis not present

## 2016-05-18 DIAGNOSIS — G629 Polyneuropathy, unspecified: Secondary | ICD-10-CM | POA: Diagnosis not present

## 2016-05-18 NOTE — Progress Notes (Signed)
Subjective: 51 year old male presents the office today for follow-up evaluation of bilateral foot pain. States the gabapentin is helping we can only take 2 pills a day. He states that he does the 30th bad headache. He states the heel pain is also much improved. Some occasional pain but it is improving. He denies any swelling or redness. He has been stretching and icing his been wearing a night splint. Denies any systemic complaints such as fevers, chills, nausea, vomiting. No acute changes since last appointment, and no other complaints at this time.   Objective: AAO x3, NAD DP/PT pulses palpable bilaterally, CRT less than 3 seconds There is minimal tenderness to palpation on the plantar medial tubercle of the calcaneus at the insertion plantar fasciitis and right side. There is no pain lateral compression of the calcaneus bilaterally. There is no pain along the course of plantar fascial bilaterally. There is no other areas of tenderness identified bilaterally.  Decreased sensation with Dorann OuSimms Weinstein monofilament No open lesions or pre-ulcerative lesions.  No pain with calf compression, swelling, warmth, erythema  Assessment: Chronic plantar fasciitis bilaterally, neuropathy  Plan: -All treatment options discussed with the patient including all alternatives, risks, complications.  -Continue gabapentin. Discussed the possible change to Lyrica the future -Continue stretching, icing exercises. He wishes to hold off on an injection today. Upon fascial brace was dispensed. Continue supportive shoe gear. -He is having multiple shoulder and arm issues as well as plantar fasciitis. He is complaining disability for this. He's going to discuss this with his primary care physician. -Patient encouraged to call the office with any questions, concerns, change in symptoms.   Ovid CurdMatthew Kyona Chauncey, DPM

## 2016-05-21 ENCOUNTER — Telehealth: Payer: Self-pay | Admitting: Orthopaedic Surgery

## 2016-05-22 MED ORDER — HYDROCODONE-ACETAMINOPHEN 7.5-325 MG PO TABS: ORAL_TABLET | ORAL | 0 refills | Status: DC

## 2016-05-28 ENCOUNTER — Telehealth: Payer: Self-pay | Admitting: Orthopaedic Surgery

## 2016-05-29 MED ORDER — HYDROCODONE-ACETAMINOPHEN 7.5-325 MG PO TABS: ORAL_TABLET | ORAL | 0 refills | Status: DC

## 2016-06-04 ENCOUNTER — Telehealth: Payer: Self-pay | Admitting: Orthopaedic Surgery

## 2016-06-05 MED ORDER — HYDROCODONE-ACETAMINOPHEN 7.5-325 MG PO TABS: ORAL_TABLET | ORAL | 0 refills | Status: DC

## 2016-06-11 ENCOUNTER — Telehealth: Payer: Self-pay | Admitting: Orthopaedic Surgery

## 2016-06-11 MED ORDER — HYDROCODONE-ACETAMINOPHEN 7.5-325 MG PO TABS: ORAL_TABLET | ORAL | 0 refills | Status: DC

## 2016-06-13 ENCOUNTER — Ambulatory Visit (INDEPENDENT_AMBULATORY_CARE_PROVIDER_SITE_OTHER): Payer: Medicaid Other | Admitting: Orthopaedic Surgery

## 2016-06-13 VITALS — BP 171/98 | HR 89 | Temp 98.1°F | Ht 69.75 in | Wt 332.0 lb

## 2016-06-13 DIAGNOSIS — G8929 Other chronic pain: Secondary | ICD-10-CM

## 2016-06-13 DIAGNOSIS — M25511 Pain in right shoulder: Secondary | ICD-10-CM | POA: Diagnosis not present

## 2016-06-13 DIAGNOSIS — M25512 Pain in left shoulder: Secondary | ICD-10-CM

## 2016-06-13 NOTE — Progress Notes (Signed)
Patient David Brooks, male DOB:02-05-1966, 51 y.o. [id number]:[id number]  Chief Complaint  Patient presents with  . Follow-up    left shoulder pain    HPI  David Brooks is a 51 y.o. male who has had acute pain of the left shoulder. He had an injection last time.  He is much improved.  He has less pain and better motion.  He has no numbness or swelling. HPI  Body mass index is 47.98 kg/m.  ROS  Review of Systems  Constitutional:       He has diabetes He has hypertension He has no COPD He does not smoke.  HENT: Negative for congestion.   Respiratory: Negative for cough and shortness of breath.   Cardiovascular: Negative for chest pain and leg swelling.  Endocrine: Positive for cold intolerance.  Musculoskeletal: Positive for arthralgias and myalgias.  Allergic/Immunologic: Positive for environmental allergies.    Past Medical History:  Diagnosis Date  . Diabetes mellitus without complication (HCC)   . Hypertension   . Rotator cuff (capsule) sprain     No past surgical history on file.  Family History  Problem Relation Age of Onset  . Hypertension Mother   . Hyperlipidemia Mother   . Cancer Father     throat    Social History Social History  Substance Use Topics  . Smoking status: Never Smoker  . Smokeless tobacco: Never Used  . Alcohol use No    Allergies  Allergen Reactions  . Penicillins Anaphylaxis  . Shrimp [Shellfish Allergy]     anaphylaxis  . Tramadol Rash    Possible combination with Zantac    Current Outpatient Prescriptions  Medication Sig Dispense Refill  . amLODipine (NORVASC) 10 MG tablet Take 10 mg by mouth daily.    Marland Kitchen aspirin EC 81 MG tablet Take 81 mg by mouth daily.    . carvedilol (COREG) 12.5 MG tablet Take 12.5 mg by mouth daily.     Marland Kitchen gabapentin (NEURONTIN) 100 MG capsule Take 1 capsule (100 mg total) by mouth at bedtime. 90 capsule 3  . HYDROcodone-acetaminophen (NORCO) 7.5-325 MG tablet One every six hours as needed for  pain.  Seven day limit per Medicaid guidelines. 28 tablet 0  . ibuprofen (ADVIL,MOTRIN) 800 MG tablet Take 800 mg by mouth every 6 (six) hours as needed for moderate pain.    Marland Kitchen lisinopril-hydrochlorothiazide (PRINZIDE,ZESTORETIC) 10-12.5 MG per tablet Take 1 tablet by mouth daily.     Marland Kitchen lovastatin (MEVACOR) 20 MG tablet Take 20 mg by mouth at bedtime.    . metFORMIN (GLUCOPHAGE) 500 MG tablet Take 500 mg by mouth daily with breakfast.    . predniSONE (STERAPRED UNI-PAK 21 TAB) 10 MG (21) TBPK tablet Take six pills the first day;5 pills the next day;4 pills the next day;3 pills the next day; 2 pills the next day,one the final day. 21 tablet 1   Current Facility-Administered Medications  Medication Dose Route Frequency Provider Last Rate Last Dose  . triamcinolone acetonide (KENALOG) 10 MG/ML injection 10 mg  10 mg Other Once Vivi Barrack, DPM         Physical Exam  Blood pressure (!) 171/98, pulse 89, temperature 98.1 F (36.7 C), height 5' 9.75" (1.772 m), weight (!) 332 lb (150.6 kg).  Constitutional: overall normal hygiene, normal nutrition, well developed, normal grooming, normal body habitus. Assistive device:none  Musculoskeletal: gait and station Limp none, muscle tone and strength are normal, no tremors or atrophy is present.  Marland Kitchen  Neurological: coordination overall normal.  Deep tendon reflex/nerve stretch intact.  Sensation normal.  Cranial nerves II-XII intact.   Skin:   Normal overall no scars, lesions, ulcers or rashes. No psoriasis.  Psychiatric: Alert and oriented x 3.  Recent memory intact, remote memory unclear.  Normal mood and affect. Well groomed.  Good eye contact.  Cardiovascular: overall no swelling, no varicosities, no edema bilaterally, normal temperatures of the legs and arms, no clubbing, cyanosis and good capillary refill.  Lymphatic: palpation is normal.  He has near full motion of the left shoulder today and minimal pain.  NV intact.  Grips  normal.  The patient has been educated about the nature of the problem(s) and counseled on treatment options.  The patient appeared to understand what I have discussed and is in agreement with it.  Encounter Diagnoses  Name Primary?  . Acute pain of left shoulder Yes  . Chronic right shoulder pain     PLAN Call if any problems.  Precautions discussed.  Continue current medications.   Return to clinic 1 month   Electronically Signed Darreld McleanWayne Rahsaan Weakland, MD 2/28/20183:43 PM

## 2016-06-24 ENCOUNTER — Telehealth: Payer: Self-pay | Admitting: Orthopaedic Surgery

## 2016-06-25 MED ORDER — HYDROCODONE-ACETAMINOPHEN 7.5-325 MG PO TABS: ORAL_TABLET | ORAL | 0 refills | Status: DC

## 2016-07-02 ENCOUNTER — Telehealth: Payer: Self-pay | Admitting: Orthopaedic Surgery

## 2016-07-03 MED ORDER — HYDROCODONE-ACETAMINOPHEN 7.5-325 MG PO TABS: ORAL_TABLET | ORAL | 0 refills | Status: DC

## 2016-07-09 ENCOUNTER — Telehealth: Payer: Self-pay | Admitting: Orthopaedic Surgery

## 2016-07-09 MED ORDER — HYDROCODONE-ACETAMINOPHEN 7.5-325 MG PO TABS: ORAL_TABLET | ORAL | 0 refills | Status: DC

## 2016-07-11 ENCOUNTER — Encounter: Payer: Self-pay | Admitting: Orthopaedic Surgery

## 2016-07-11 ENCOUNTER — Ambulatory Visit (INDEPENDENT_AMBULATORY_CARE_PROVIDER_SITE_OTHER): Payer: Medicaid Other | Admitting: Orthopaedic Surgery

## 2016-07-11 VITALS — BP 153/100 | HR 90 | Temp 97.8°F | Ht 71.0 in | Wt 332.0 lb

## 2016-07-11 DIAGNOSIS — M25511 Pain in right shoulder: Secondary | ICD-10-CM | POA: Diagnosis not present

## 2016-07-11 DIAGNOSIS — G8929 Other chronic pain: Secondary | ICD-10-CM

## 2016-07-11 NOTE — Progress Notes (Signed)
Patient ID:David Brooks, male DOB:[birth year], 51 y.o. WUJ:[id number]  Chief Complaint  Patient presentZO:XWRUEAVs with  . Shoulder Pain    right    HPI  David Brooks is a 51 y.o. male who has had right shoulder pain. He fell in the snow this past Saturday and re-injured his shoulder.  He is better now.  He has no paresthesias.  He is taking his medicine. HPI  Body mass index is 46.3 kg/m.  ROS  Review of Systems  Constitutional:       He has diabetes He has hypertension He has no COPD He does not smoke.  HENT: Negative for congestion.   Respiratory: Negative for cough and shortness of breath.   Cardiovascular: Negative for chest pain and leg swelling.  Endocrine: Positive for cold intolerance.  Musculoskeletal: Positive for arthralgias and myalgias.  Allergic/Immunologic: Positive for environmental allergies.    Past Medical History:  Diagnosis Date  . Diabetes mellitus without complication (HCC)   . Hypertension   . Rotator cuff (capsule) sprain     No past surgical history on file.  Family History  Problem Relation Age of Onset  . Hypertension Mother   . Hyperlipidemia Mother   . Cancer Father     throat    Social History Social History  Substance Use Topics  . Smoking status: Never Smoker  . Smokeless tobacco: Never Used  . Alcohol use No    Allergies  Allergen Reactions  . Penicillins Anaphylaxis  . Shrimp [Shellfish Allergy]     anaphylaxis  . Tramadol Rash    Possible combination with Zantac    Current Outpatient Prescriptions  Medication Sig Dispense Refill  . amLODipine (NORVASC) 10 MG tablet Take 10 mg by mouth daily.    Marland Kitchen. aspirin EC 81 MG tablet Take 81 mg by mouth daily.    . carvedilol (COREG) 12.5 MG tablet Take 12.5 mg by mouth daily.     Marland Kitchen. gabapentin (NEURONTIN) 100 MG capsule Take 1 capsule (100 mg total) by mouth at bedtime. 90 capsule 3  . HYDROcodone-acetaminophen (NORCO) 7.5-325 MG tablet One every six hours as needed for pain.   Seven day limit per Medicaid guidelines. 21 tablet 0  . ibuprofen (ADVIL,MOTRIN) 800 MG tablet Take 800 mg by mouth every 6 (six) hours as needed for moderate pain.    Marland Kitchen. lisinopril-hydrochlorothiazide (PRINZIDE,ZESTORETIC) 10-12.5 MG per tablet Take 1 tablet by mouth daily.     Marland Kitchen. lovastatin (MEVACOR) 20 MG tablet Take 20 mg by mouth at bedtime.    . metFORMIN (GLUCOPHAGE) 500 MG tablet Take 500 mg by mouth daily with breakfast.    . predniSONE (STERAPRED UNI-PAK 21 TAB) 10 MG (21) TBPK tablet Take six pills the first day;5 pills the next day;4 pills the next day;3 pills the next day; 2 pills the next day,one the final day. 21 tablet 1   Current Facility-Administered Medications  Medication Dose Route Frequency Provider Last Rate Last Dose  . triamcinolone acetonide (KENALOG) 10 MG/ML injection 10 mg  10 mg Other Once Vivi BarrackMatthew R Wagoner, DPM         Physical Exam  Blood pressure (!) 153/100, pulse 90, temperature 97.8 F (36.6 C), height 5\' 11"  (1.803 m), weight (!) 332 lb (150.6 kg).  Constitutional: overall normal hygiene, normal nutrition, well developed, normal grooming, normal body habitus. Assistive device:none  Musculoskeletal: gait and station Limp none, muscle tone and strength are normal, no tremors or atrophy is present.  .  Neurological: coordination  overall normal.  Deep tendon reflex/nerve stretch intact.  Sensation normal.  Cranial nerves II-XII intact.   Skin:   Normal overall no scars, lesions, ulcers or rashes. No psoriasis.  Psychiatric: Alert and oriented x 3.  Recent memory intact, remote memory unclear.  Normal mood and affect. Well groomed.  Good eye contact.  Cardiovascular: overall no swelling, no varicosities, no edema bilaterally, normal temperatures of the legs and arms, no clubbing, cyanosis and good capillary refill.  Lymphatic: palpation is normal.  Examination of right Upper Extremity is done.  Inspection:   Overall:  Elbow non-tender without crepitus  or defects, forearm non-tender without crepitus or defects, wrist non-tender without crepitus or defects, hand non-tender.    Shoulder: with glenohumeral joint tenderness, without effusion.   Upper arm: without swelling and tenderness   Range of motion:   Overall:  Full range of motion of the elbow, full range of motion of wrist and full range of motion in fingers.   Shoulder:  right  145 degrees forward flexion; 120 degrees abduction; 35 degrees internal rotation, 35 degrees external rotation, 15 degrees extension, 40 degrees adduction.   Stability:   Overall:  Shoulder, elbow and wrist stable   Strength and Tone:   Overall full shoulder muscles strength, full upper arm strength and normal upper arm bulk and tone.   The patient has been educated about the nature of the problem(s) and counseled on treatment options.  The patient appeared to understand what I have discussed and is in agreement with it.  Encounter Diagnosis  Name Primary?  . Chronic right shoulder pain Yes    PLAN Call if any problems.  Precautions discussed.  Continue current medications.   Return to clinic 1 month   Electronically Signed Darreld Mclean, MD 3/28/20182:05 PM

## 2016-07-16 ENCOUNTER — Telehealth: Payer: Self-pay | Admitting: Orthopaedic Surgery

## 2016-07-17 MED ORDER — HYDROCODONE-ACETAMINOPHEN 7.5-325 MG PO TABS: ORAL_TABLET | ORAL | 0 refills | Status: DC

## 2016-07-23 ENCOUNTER — Telehealth: Payer: Self-pay | Admitting: Orthopaedic Surgery

## 2016-07-24 MED ORDER — HYDROCODONE-ACETAMINOPHEN 7.5-325 MG PO TABS: ORAL_TABLET | ORAL | 0 refills | Status: DC

## 2016-07-30 ENCOUNTER — Telehealth: Payer: Self-pay | Admitting: Orthopaedic Surgery

## 2016-07-31 MED ORDER — HYDROCODONE-ACETAMINOPHEN 7.5-325 MG PO TABS: ORAL_TABLET | ORAL | 0 refills | Status: DC

## 2016-08-06 ENCOUNTER — Telehealth: Payer: Self-pay | Admitting: Orthopaedic Surgery

## 2016-08-07 MED ORDER — HYDROCODONE-ACETAMINOPHEN 7.5-325 MG PO TABS: ORAL_TABLET | ORAL | 0 refills | Status: DC

## 2016-08-08 ENCOUNTER — Ambulatory Visit (INDEPENDENT_AMBULATORY_CARE_PROVIDER_SITE_OTHER): Payer: Medicaid Other | Admitting: Orthopaedic Surgery

## 2016-08-08 ENCOUNTER — Encounter: Payer: Self-pay | Admitting: Orthopaedic Surgery

## 2016-08-08 VITALS — BP 181/107 | HR 75 | Ht 71.0 in | Wt 334.0 lb

## 2016-08-08 DIAGNOSIS — M25512 Pain in left shoulder: Secondary | ICD-10-CM

## 2016-08-08 DIAGNOSIS — G8929 Other chronic pain: Secondary | ICD-10-CM

## 2016-08-08 DIAGNOSIS — M25511 Pain in right shoulder: Secondary | ICD-10-CM

## 2016-08-08 NOTE — Progress Notes (Signed)
Patient David Brooks, male DOB:April 27, 1965, 51 y.o. [id number]:[id number]  Chief Complaint  Patient presents with  . Follow-up    chronic right shoulder pain    HPI  David Brooks is a 51 y.o. male who has chronic pain more of the right shoulder.  His left shoulder is bothering him more recently. He has pain with overhead lifting.  He has no new trauma or paresthesias.  He has had elevated blood pressures this visit and last.  I asked that he see his family doctor about this as soon as he can.  He said he will. HPI  Body mass index is 46.58 kg/m.  ROS  Review of Systems  Constitutional:       He has diabetes He has hypertension He has no COPD He does not smoke.  HENT: Negative for congestion.   Respiratory: Negative for cough and shortness of breath.   Cardiovascular: Negative for chest pain and leg swelling.  Endocrine: Positive for cold intolerance.  Musculoskeletal: Positive for arthralgias and myalgias.  Allergic/Immunologic: Positive for environmental allergies.    Past Medical History:  Diagnosis Date  . Diabetes mellitus without complication (HCC)   . Hypertension   . Rotator cuff (capsule) sprain     No past surgical history on file.  Family History  Problem Relation Age of Onset  . Hypertension Mother   . Hyperlipidemia Mother   . Cancer Father     throat    Social History Social History  Substance Use Topics  . Smoking status: Never Smoker  . Smokeless tobacco: Never Used  . Alcohol use No    Allergies  Allergen Reactions  . Penicillins Anaphylaxis  . Shrimp [Shellfish Allergy]     anaphylaxis  . Tramadol Rash    Possible combination with Zantac    Current Outpatient Prescriptions  Medication Sig Dispense Refill  . amLODipine (NORVASC) 10 MG tablet Take 10 mg by mouth daily.    Marland Kitchen aspirin EC 81 MG tablet Take 81 mg by mouth daily.    . carvedilol (COREG) 12.5 MG tablet Take 12.5 mg by mouth daily.     Marland Kitchen gabapentin (NEURONTIN) 100 MG  capsule Take 1 capsule (100 mg total) by mouth at bedtime. 90 capsule 3  . HYDROcodone-acetaminophen (NORCO) 7.5-325 MG tablet One every six hours as needed for pain.  Seven day limit per Medicaid guidelines. 21 tablet 0  . ibuprofen (ADVIL,MOTRIN) 800 MG tablet Take 800 mg by mouth every 6 (six) hours as needed for moderate pain.    Marland Kitchen lisinopril-hydrochlorothiazide (PRINZIDE,ZESTORETIC) 10-12.5 MG per tablet Take 1 tablet by mouth daily.     Marland Kitchen lovastatin (MEVACOR) 20 MG tablet Take 20 mg by mouth at bedtime.    . metFORMIN (GLUCOPHAGE) 500 MG tablet Take 500 mg by mouth daily with breakfast.    . predniSONE (STERAPRED UNI-PAK 21 TAB) 10 MG (21) TBPK tablet Take six pills the first day;5 pills the next day;4 pills the next day;3 pills the next day; 2 pills the next day,one the final day. 21 tablet 1   Current Facility-Administered Medications  Medication Dose Route Frequency Provider Last Rate Last Dose  . triamcinolone acetonide (KENALOG) 10 MG/ML injection 10 mg  10 mg Other Once Vivi Barrack, DPM         Physical Exam  Blood pressure (!) 181/107, pulse 75, height  (1.803 m), weight (!) 334 lb (151.5 kg).  Constitutional: overall normal hygiene, normal nutrition, well developed, normal grooming, normal body  habitus. Assistive device:none  Musculoskeletal: gait and station Limp none, muscle tone and strength are normal, no tremors or atrophy is present.  .  Neurological: coordination overall normal.  Deep tendon reflex/nerve stretch intact.  Sensation normal.  Cranial nerves II-XII intact.   Skin:   Normal overall no scars, lesions, ulcers or rashes. No psoriasis.  Psychiatric: Alert and oriented x 3.  Recent memory intact, remote memory unclear.  Normal mood and affect. Well groomed.  Good eye contact.  Cardiovascular: overall no swelling, no varicosities, no edema bilaterally, normal temperatures of the legs and arms, no clubbing, cyanosis and good capillary  refill.  Lymphatic: palpation is normal.  Examination of right Upper Extremity is done.  Inspection:   Overall:  Elbow non-tender without crepitus or defects, forearm non-tender without crepitus or defects, wrist non-tender without crepitus or defects, hand non-tender.    Shoulder: with glenohumeral joint tenderness, without effusion.   Upper arm: without swelling and tenderness   Range of motion:   Overall:  Full range of motion of the elbow, full range of motion of wrist and full range of motion in fingers.   Shoulder:  right  145 degrees forward flexion; 120 degrees abduction; 35 degrees internal rotation, 35 degrees external rotation, 15 degrees extension, 40 degrees adduction.   Stability:   Overall:  Shoulder, elbow and wrist stable   Strength and Tone:   Overall full shoulder muscles strength, full upper arm strength and normal upper arm bulk and tone.   The patient has been educated about the nature of the problem(s) and counseled on treatment options.  The patient appeared to understand what I have discussed and is in agreement with it.  Encounter Diagnoses  Name Primary?  . Chronic right shoulder pain Yes  . Chronic left shoulder pain     PLAN Call if any problems.  Precautions discussed.  Continue current medications.   Return to clinic 6 weeks   Electronically Signed Darreld Mclean, MD 4/25/20181:59 PM

## 2016-08-13 ENCOUNTER — Other Ambulatory Visit: Payer: Self-pay | Admitting: *Deleted

## 2016-08-13 ENCOUNTER — Other Ambulatory Visit: Payer: Self-pay | Admitting: Orthopaedic Surgery

## 2016-08-13 MED ORDER — HYDROCODONE-ACETAMINOPHEN 7.5-325 MG PO TABS: ORAL_TABLET | ORAL | 0 refills | Status: DC

## 2016-08-20 ENCOUNTER — Telehealth: Payer: Self-pay | Admitting: Orthopedic Surgery

## 2016-08-21 MED ORDER — HYDROCODONE-ACETAMINOPHEN 7.5-325 MG PO TABS: ORAL_TABLET | ORAL | 0 refills | Status: DC

## 2016-08-27 ENCOUNTER — Telehealth: Payer: Self-pay | Admitting: Orthopaedic Surgery

## 2016-08-28 MED ORDER — HYDROCODONE-ACETAMINOPHEN 7.5-325 MG PO TABS: ORAL_TABLET | ORAL | 0 refills | Status: DC

## 2016-09-03 ENCOUNTER — Telehealth: Payer: Self-pay | Admitting: Orthopaedic Surgery

## 2016-09-04 MED ORDER — HYDROCODONE-ACETAMINOPHEN 7.5-325 MG PO TABS: ORAL_TABLET | ORAL | 0 refills | Status: DC

## 2016-09-19 ENCOUNTER — Encounter: Payer: Self-pay | Admitting: Orthopaedic Surgery

## 2016-09-19 ENCOUNTER — Ambulatory Visit (INDEPENDENT_AMBULATORY_CARE_PROVIDER_SITE_OTHER): Payer: Medicaid Other | Admitting: Orthopaedic Surgery

## 2016-09-19 VITALS — BP 138/88 | HR 73 | Ht 71.0 in | Wt 328.0 lb

## 2016-09-19 DIAGNOSIS — G8929 Other chronic pain: Secondary | ICD-10-CM | POA: Diagnosis not present

## 2016-09-19 DIAGNOSIS — M25512 Pain in left shoulder: Secondary | ICD-10-CM

## 2016-09-19 DIAGNOSIS — M25511 Pain in right shoulder: Secondary | ICD-10-CM | POA: Diagnosis not present

## 2016-09-19 NOTE — Progress Notes (Signed)
Patient RU:EAVWUJW:David Brooks, male DOB:Apr 25, 1965, 51 y.o. JXB:[id number]  Chief Complaint  Patient presents with  . Follow-up    Bilateral shoulder pain    HPI  David Brooks is a 51 y.o. male who has bilateral shoulder pain.  He has no new trauma. He is active and going to [employer].  He has more pain with overhead use.  He has no paresthesias.  Dr. Sudie BaileyKnowlton will be managing his pain medicine. HPI  Body mass index is 45.75 kg/m.  ROS  Review of Systems  Constitutional:       He has diabetes He has hypertension He has no COPD He does not smoke.  HENT: Negative for congestion.   Respiratory: Negative for cough and shortness of breath.   Cardiovascular: Negative for chest pain and leg swelling.  Endocrine: Positive for cold intolerance.  Musculoskeletal: Positive for arthralgias and myalgias.  Allergic/Immunologic: Positive for environmental allergies.    Past Medical History:  Diagnosis Date  . Diabetes mellitus without complication (HCC)   . Hypertension   . Rotator cuff (capsule) sprain     History reviewed. No pertinent surgical history.  Family History  Problem Relation Age of Onset  . Hypertension Mother   . Hyperlipidemia Mother   . Cancer Father        throat    Social History Social History  Substance Use Topics  . Smoking status: Never Smoker  . Smokeless tobacco: Never Used  . Alcohol use No    Allergies  Allergen Reactions  . Penicillins Anaphylaxis  . Shrimp [Shellfish Allergy]     anaphylaxis  . Tramadol Rash    Possible combination with Zantac    Current Outpatient Prescriptions  Medication Sig Dispense Refill  . HYDROcodone-acetaminophen (NORCO) 10-325 MG tablet Take 1 tablet by mouth every 6 (six) hours as needed.    Marland Kitchen. amLODipine (NORVASC) 10 MG tablet Take 10 mg by mouth daily.    Marland Kitchen. aspirin EC 81 MG tablet Take 81 mg by mouth daily.    . carvedilol (COREG) 12.5 MG tablet Take 12.5 mg by mouth daily.     Marland Kitchen. gabapentin (NEURONTIN) 100  MG capsule Take 1 capsule (100 mg total) by mouth at bedtime. 90 capsule 3  . HYDROcodone-acetaminophen (NORCO) 7.5-325 MG tablet One every six hours as needed for pain.  Seven day limit per Medicaid guidelines. 21 tablet 0  . ibuprofen (ADVIL,MOTRIN) 800 MG tablet Take 800 mg by mouth every 6 (six) hours as needed for moderate pain.    Marland Kitchen. lisinopril-hydrochlorothiazide (PRINZIDE,ZESTORETIC) 10-12.5 MG per tablet Take 1 tablet by mouth daily.     Marland Kitchen. lovastatin (MEVACOR) 20 MG tablet Take 20 mg by mouth at bedtime.    . metFORMIN (GLUCOPHAGE) 500 MG tablet Take 500 mg by mouth daily with breakfast.    . predniSONE (STERAPRED UNI-PAK 21 TAB) 10 MG (21) TBPK tablet Take six pills the first day;5 pills the next day;4 pills the next day;3 pills the next day; 2 pills the next day,one the final day. 21 tablet 1   Current Facility-Administered Medications  Medication Dose Route Frequency Provider Last Rate Last Dose  . triamcinolone acetonide (KENALOG) 10 MG/ML injection 10 mg  10 mg Other Once Vivi BarrackWagoner, Matthew R, DPM         Physical Exam  Blood pressure 138/88, pulse 73, height 5\' 11"  (1.803 m), weight (!) 328 lb (148.8 kg).  Constitutional: overall normal hygiene, normal nutrition, well developed, normal grooming, normal body habitus. Assistive  device:none  Musculoskeletal: gait and station Limp none, muscle tone and strength are normal, no tremors or atrophy is present.  .  Neurological: coordination overall normal.  Deep tendon reflex/nerve stretch intact.  Sensation normal.  Cranial nerves II-XII intact.   Skin:   Normal overall no scars, lesions, ulcers or rashes. No psoriasis.  Psychiatric: Alert and oriented x 3.  Recent memory intact, remote memory unclear.  Normal mood and affect. Well groomed.  Good eye contact.  Cardiovascular: overall no swelling, no varicosities, no edema bilaterally, normal temperatures of the legs and arms, no clubbing, cyanosis and good capillary  refill.  Lymphatic: palpation is normal.  His shoulders are bilaterally tender.  He has full motion today bilaterally but pain in the extremes, more on the right.  NV intact. Muscle tone and strength are normal.  The patient has been educated about the nature of the problem(s) and counseled on treatment options.  The patient appeared to understand what I have discussed and is in agreement with it.  Encounter Diagnoses  Name Primary?  . Chronic right shoulder pain Yes  . Chronic left shoulder pain     PLAN Call if any problems.  Precautions discussed.  Continue current medications.   Return to clinic 2 months   Electronically Signed Darreld Mclean, MD 6/6/20181:37 PM

## 2016-11-20 ENCOUNTER — Ambulatory Visit: Payer: Medicaid Other | Admitting: Orthopaedic Surgery

## 2016-11-21 ENCOUNTER — Encounter: Payer: Self-pay | Admitting: Orthopaedic Surgery

## 2016-11-21 ENCOUNTER — Ambulatory Visit (INDEPENDENT_AMBULATORY_CARE_PROVIDER_SITE_OTHER): Payer: Medicaid Other | Admitting: Orthopaedic Surgery

## 2016-11-21 VITALS — BP 135/82 | HR 73 | Temp 98.4°F | Ht 71.0 in | Wt 327.0 lb

## 2016-11-21 DIAGNOSIS — M25511 Pain in right shoulder: Secondary | ICD-10-CM

## 2016-11-21 DIAGNOSIS — G8929 Other chronic pain: Secondary | ICD-10-CM | POA: Diagnosis not present

## 2016-11-21 NOTE — Progress Notes (Signed)
PROCEDURE NOTE:  The patient request injection, verbal consent was obtained.  The right shoulder was prepped appropriately after time out was performed.   Sterile technique was observed and injection of 1 cc of Depo-Medrol 40 mg with several cc's of plain xylocaine. Anesthesia was provided by ethyl chloride and a 20-gauge needle was used to inject the shoulder area. A posterior approach was used.  The injection was tolerated well.  A band aid dressing was applied.  The patient was advised to apply ice later today and tomorrow to the injection sight as needed.  Encounter Diagnosis  Name Primary?  . Chronic right shoulder pain Yes   Return in two months.  Call if any problem.  Precautions discussed.    Electronically Signed Darreld McleanWayne Rohen Kimes, MD 8/8/20182:24 PM

## 2017-01-23 ENCOUNTER — Ambulatory Visit (INDEPENDENT_AMBULATORY_CARE_PROVIDER_SITE_OTHER): Payer: Medicaid Other | Admitting: Orthopaedic Surgery

## 2017-01-23 ENCOUNTER — Encounter: Payer: Self-pay | Admitting: Orthopaedic Surgery

## 2017-01-23 VITALS — BP 140/81 | HR 87 | Temp 98.8°F | Ht 71.0 in | Wt 332.0 lb

## 2017-01-23 DIAGNOSIS — G8929 Other chronic pain: Secondary | ICD-10-CM

## 2017-01-23 DIAGNOSIS — M25511 Pain in right shoulder: Secondary | ICD-10-CM | POA: Diagnosis not present

## 2017-01-23 NOTE — Progress Notes (Signed)
Patient David Brooks, male DOB:04-24-1965, 51 y.o. [id number]:[id number]  Chief Complaint  Patient presents with  . Follow-up    right shoulder    HPI  David Brooks is a 51 y.o. male who has right shoulder pain chronically.  He has more pain at the end of work and sometimes at night. He has pain with overhead use.  He has no numbness and no new trauma. HPI  Body mass index is 46.3 kg/m.  ROS  Review of Systems  Constitutional:       He has diabetes He has hypertension He has no COPD He does not smoke.  HENT: Negative for congestion.   Respiratory: Negative for cough and shortness of breath.   Cardiovascular: Negative for chest pain and leg swelling.  Endocrine: Positive for cold intolerance.  Musculoskeletal: Positive for arthralgias and myalgias.  Allergic/Immunologic: Positive for environmental allergies.    Past Medical History:  Diagnosis Date  . Diabetes mellitus without complication (HCC)   . Hypertension   . Rotator cuff (capsule) sprain     No past surgical history on file.  Family History  Problem Relation Age of Onset  . Hypertension Mother   . Hyperlipidemia Mother   . Cancer Father        throat    Social History Social History  Substance Use Topics  . Smoking status: Never Smoker  . Smokeless tobacco: Never Used  . Alcohol use No    Allergies  Allergen Reactions  . Penicillins Anaphylaxis  . Shrimp [Shellfish Allergy]     anaphylaxis  . Tramadol Rash    Possible combination with Zantac    Current Outpatient Prescriptions  Medication Sig Dispense Refill  . amLODipine (NORVASC) 10 MG tablet Take 10 mg by mouth daily.    Marland Kitchen aspirin EC 81 MG tablet Take 81 mg by mouth daily.    . carvedilol (COREG) 12.5 MG tablet Take 12.5 mg by mouth daily.     Marland Kitchen gabapentin (NEURONTIN) 100 MG capsule Take 1 capsule (100 mg total) by mouth at bedtime. 90 capsule 3  . HYDROcodone-acetaminophen (NORCO) 10-325 MG tablet Take 1 tablet by mouth every 6 (six)  hours as needed.    Marland Kitchen HYDROcodone-acetaminophen (NORCO) 7.5-325 MG tablet One every six hours as needed for pain.  Seven day limit per Medicaid guidelines. 21 tablet 0  . ibuprofen (ADVIL,MOTRIN) 800 MG tablet Take 800 mg by mouth every 6 (six) hours as needed for moderate pain.    Marland Kitchen lisinopril-hydrochlorothiazide (PRINZIDE,ZESTORETIC) 10-12.5 MG per tablet Take 1 tablet by mouth daily.     Marland Kitchen lovastatin (MEVACOR) 20 MG tablet Take 20 mg by mouth at bedtime.    . metFORMIN (GLUCOPHAGE) 500 MG tablet Take 500 mg by mouth daily with breakfast.    . predniSONE (STERAPRED UNI-PAK 21 TAB) 10 MG (21) TBPK tablet Take six pills the first day;5 pills the next day;4 pills the next day;3 pills the next day; 2 pills the next day,one the final day. 21 tablet 1   Current Facility-Administered Medications  Medication Dose Route Frequency Provider Last Rate Last Dose  . triamcinolone acetonide (KENALOG) 10 MG/ML injection 10 mg  10 mg Other Once Vivi Barrack, DPM         Physical Exam  Blood pressure 140/81, pulse 87, temperature 98.8 F (37.1 C), height  (1.803 m), weight (!) 332 lb (150.6 kg).  Constitutional: overall normal hygiene, normal nutrition, well developed, normal grooming, normal body habitus. Assistive device:none  Musculoskeletal: gait and station Limp none, muscle tone and strength are normal, no tremors or atrophy is present.  .  Neurological: coordination overall normal.  Deep tendon reflex/nerve stretch intact.  Sensation normal.  Cranial nerves II-XII intact.   Skin:   Normal overall no scars, lesions, ulcers or rashes. No psoriasis.  Psychiatric: Alert and oriented x 3.  Recent memory intact, remote memory unclear.  Normal mood and affect. Well groomed.  Good eye contact.  Cardiovascular: overall no swelling, no varicosities, no edema bilaterally, normal temperatures of the legs and arms, no clubbing, cyanosis and good capillary refill.  Lymphatic: palpation is  normal.  All other systems reviewed and are negative   Examination of right Upper Extremity is done.  Inspection:   Overall:  Elbow non-tender without crepitus or defects, forearm non-tender without crepitus or defects, wrist non-tender without crepitus or defects, hand non-tender.    Shoulder: with glenohumeral joint tenderness, without effusion.   Upper arm: without swelling and tenderness   Range of motion:   Overall:  Full range of motion of the elbow, full range of motion of wrist and full range of motion in fingers.   Shoulder:  right  165 degrees forward flexion; 150 degrees abduction; 40 degrees internal rotation, 40 degrees external rotation, 20 degrees extension, 40 degrees adduction.   Stability:   Overall:  Shoulder, elbow and wrist stable   Strength and Tone:   Overall full shoulder muscles strength, full upper arm strength and normal upper arm bulk and tone. The patient has been educated about the nature of the problem(s) and counseled on treatment options.  The patient appeared to understand what I have discussed and is in agreement with it.  Encounter Diagnosis  Name Primary?  . Chronic right shoulder pain Yes    PLAN Call if any problems.  Precautions discussed.  Continue current medications.   Return to clinic 3 months   Electronically Signed Darreld Mclean, MD 10/10/20182:25 PM

## 2017-04-25 ENCOUNTER — Ambulatory Visit: Payer: Medicaid Other | Admitting: Orthopaedic Surgery

## 2017-05-02 ENCOUNTER — Ambulatory Visit: Payer: Medicaid Other | Admitting: Orthopaedic Surgery

## 2017-05-08 ENCOUNTER — Ambulatory Visit: Payer: Medicaid Other | Admitting: Orthopaedic Surgery

## 2017-05-16 ENCOUNTER — Ambulatory Visit: Payer: Medicaid Other | Admitting: Orthopaedic Surgery

## 2017-05-21 ENCOUNTER — Ambulatory Visit: Payer: Medicaid Other | Admitting: Orthopaedic Surgery

## 2017-06-11 ENCOUNTER — Encounter: Payer: Self-pay | Admitting: Orthopaedic Surgery

## 2017-06-11 ENCOUNTER — Ambulatory Visit: Payer: Medicaid Other | Admitting: Orthopaedic Surgery

## 2017-06-11 VITALS — BP 153/89 | HR 82 | Ht 71.0 in | Wt 328.0 lb

## 2017-06-11 DIAGNOSIS — G8929 Other chronic pain: Secondary | ICD-10-CM | POA: Diagnosis not present

## 2017-06-11 DIAGNOSIS — M25511 Pain in right shoulder: Secondary | ICD-10-CM

## 2017-06-11 NOTE — Progress Notes (Signed)
PROCEDURE NOTE:  The patient request injection, verbal consent was obtained.  The right shoulder was prepped appropriately after time out was performed.   Sterile technique was observed and injection of 1 cc of Depo-Medrol 40 mg with several cc's of plain xylocaine. Anesthesia was provided by ethyl chloride and a 20-gauge needle was used to inject the shoulder area. A posterior approach was used.  The injection was tolerated well.  A band aid dressing was applied.  The patient was advised to apply ice later today and tomorrow to the injection sight as needed.  Return in three months.  Electronically Signed Darreld McleanWayne Rithvik Orcutt, MD 2/26/20191:52 PM

## 2017-09-10 ENCOUNTER — Encounter: Payer: Self-pay | Admitting: Orthopaedic Surgery

## 2017-09-10 ENCOUNTER — Ambulatory Visit: Payer: Medicaid Other | Admitting: Orthopaedic Surgery

## 2017-09-10 VITALS — BP 162/103 | HR 99 | Ht 71.0 in | Wt 326.0 lb

## 2017-09-10 DIAGNOSIS — M25511 Pain in right shoulder: Secondary | ICD-10-CM

## 2017-09-10 DIAGNOSIS — G8929 Other chronic pain: Secondary | ICD-10-CM | POA: Diagnosis not present

## 2017-09-10 DIAGNOSIS — M25512 Pain in left shoulder: Secondary | ICD-10-CM

## 2017-09-10 NOTE — Progress Notes (Signed)
Patient David Brooks, male DOB:07-Dec-1965, 52 y.o. OZD:[id number]  Chief Complaint  Patient presents with  . Follow-up    Right Shoulder     HPI  David Brooks is a 52 y.o. male who has chronic pain of the right shoulder and some now in the left shoulder.  He has pain at the end of each shift.  Weather changes and increased activity make the shoulder worse.  He has popping now but no numbness or swelling.  Dr. Sudie Bailey is giving him his medicine.  He also has problems with plantar fasciitis treated elsewhere. HPI  Body mass index is 45.47 kg/m.  ROS  Review of Systems  Constitutional:       He has diabetes He has hypertension He has no COPD He does not smoke.  HENT: Negative for congestion.   Respiratory: Negative for cough and shortness of breath.   Cardiovascular: Negative for chest pain and leg swelling.  Endocrine: Positive for cold intolerance.  Musculoskeletal: Positive for arthralgias and myalgias.  Allergic/Immunologic: Positive for environmental allergies.  All other systems reviewed and are negative.   Past Medical History:  Diagnosis Date  . Diabetes mellitus without complication (HCC)   . Hypertension   . Rotator cuff (capsule) sprain     History reviewed. No pertinent surgical history.  Family History  Problem Relation Age of Onset  . Hypertension Mother   . Hyperlipidemia Mother   . Cancer Father        throat    Social History Social History   Tobacco Use  . Smoking status: Never Smoker  . Smokeless tobacco: Never Used  Substance Use Topics  . Alcohol use: No  . Drug use: No    Allergies  Allergen Reactions  . Penicillins Anaphylaxis  . Shrimp [Shellfish Allergy]     anaphylaxis  . Tramadol Rash    Possible combination with Zantac    Current Outpatient Medications  Medication Sig Dispense Refill  . amLODipine (NORVASC) 10 MG tablet Take 10 mg by mouth daily.    Marland Kitchen aspirin EC 81 MG tablet Take 81 mg by mouth daily.    .  carvedilol (COREG) 12.5 MG tablet Take 12.5 mg by mouth daily.     Marland Kitchen gabapentin (NEURONTIN) 100 MG capsule Take 1 capsule (100 mg total) by mouth at bedtime. 90 capsule 3  . HYDROcodone-acetaminophen (NORCO) 10-325 MG tablet Take 1 tablet by mouth every 6 (six) hours as needed.    Marland Kitchen HYDROcodone-acetaminophen (NORCO) 7.5-325 MG tablet One every six hours as needed for pain.  Seven day limit per Medicaid guidelines. 21 tablet 0  . ibuprofen (ADVIL,MOTRIN) 800 MG tablet Take 800 mg by mouth every 6 (six) hours as needed for moderate pain.    Marland Kitchen lisinopril-hydrochlorothiazide (PRINZIDE,ZESTORETIC) 10-12.5 MG per tablet Take 1 tablet by mouth daily.     Marland Kitchen lovastatin (MEVACOR) 20 MG tablet Take 20 mg by mouth at bedtime.    . metFORMIN (GLUCOPHAGE) 500 MG tablet Take 500 mg by mouth daily with breakfast.    . predniSONE (STERAPRED UNI-PAK 21 TAB) 10 MG (21) TBPK tablet Take six pills the first day;5 pills the next day;4 pills the next day;3 pills the next day; 2 pills the next day,one the final day. (Patient not taking: Reported on 06/11/2017) 21 tablet 1   Current Facility-Administered Medications  Medication Dose Route Frequency Provider Last Rate Last Dose  . triamcinolone acetonide (KENALOG) 10 MG/ML injection 10 mg  10 mg Other Once Ovid Curd  R, DPM         Physical Exam  Blood pressure (!) 162/103, pulse 99, height  (1.803 m), weight (!) 326 lb (147.9 kg).  Constitutional: overall normal hygiene, normal nutrition, well developed, normal grooming, normal body habitus. Assistive device:none  Musculoskeletal: gait and station Limp right, muscle tone and strength are normal, no tremors or atrophy is present.  .  Neurological: coordination overall normal.  Deep tendon reflex/nerve stretch intact.  Sensation normal.  Cranial nerves II-XII intact.   Skin:   Normal overall no scars, lesions, ulcers or rashes. No psoriasis.  Psychiatric: Alert and oriented x 3.  Recent memory intact,  remote memory unclear.  Normal mood and affect. Well groomed.  Good eye contact.  Cardiovascular: overall no swelling, no varicosities, no edema bilaterally, normal temperatures of the legs and arms, no clubbing, cyanosis and good capillary refill.  Lymphatic: palpation is normal.  Examination of right Upper Extremity is done.  Inspection:   Overall:  Elbow non-tender without crepitus or defects, forearm non-tender without crepitus or defects, wrist non-tender without crepitus or defects, hand non-tender.    Shoulder: with glenohumeral joint tenderness, without effusion.   Upper arm: with swelling and tenderness   Range of motion:   Overall:  Full range of motion of the elbow, full range of motion of wrist and full range of motion in fingers.   Shoulder:  right  140 degrees forward flexion; 120 degrees abduction; 35 degrees internal rotation, 35 degrees external rotation, 10 degrees extension, 40 degrees adduction.   Stability:   Overall:  Shoulder, elbow and wrist stable   Strength and Tone:   Overall full shoulder muscles strength, full upper arm strength and normal upper arm bulk and tone.  All other systems reviewed and are negative   The patient has been educated about the nature of the problem(s) and counseled on treatment options.  The patient appeared to understand what I have discussed and is in agreement with it.  Encounter Diagnoses  Name Primary?  . Chronic right shoulder pain Yes  . Chronic left shoulder pain     PLAN Call if any problems.  Precautions discussed.  Continue current medications.   Return to clinic 3 months   Electronically Signed Darreld Mclean, MD 5/28/20192:10 PM

## 2017-12-31 ENCOUNTER — Ambulatory Visit: Payer: Medicaid Other | Admitting: Orthopaedic Surgery

## 2017-12-31 ENCOUNTER — Ambulatory Visit (INDEPENDENT_AMBULATORY_CARE_PROVIDER_SITE_OTHER): Payer: Medicaid Other

## 2017-12-31 ENCOUNTER — Encounter: Payer: Self-pay | Admitting: Orthopaedic Surgery

## 2017-12-31 VITALS — BP 164/102 | HR 102 | Ht 71.0 in | Wt 331.0 lb

## 2017-12-31 DIAGNOSIS — M25561 Pain in right knee: Secondary | ICD-10-CM

## 2017-12-31 DIAGNOSIS — Z6841 Body Mass Index (BMI) 40.0 and over, adult: Secondary | ICD-10-CM | POA: Diagnosis not present

## 2017-12-31 MED ORDER — DICLOFENAC SODIUM 75 MG PO TBEC: 75.0000 mg | DELAYED_RELEASE_TABLET | Freq: Two times a day (BID) | ORAL | 2 refills | Status: DC

## 2017-12-31 NOTE — Progress Notes (Signed)
Patient ZO:XWRUEAV David Brooks, male DOB:1966/03/19, 52 y.o. [id number]:[id number]  Chief Complaint  Patient presents with  . Shoulder Pain    both, right worse     HPI  David Brooks is a 52 y.o. male who has developed pain of both knees and the right hip, more of the right knee.  He has swelling and popping but no giving way of the right knee.  The right knee is the area of most of his pain.  He has no redness, no numbness.  Nothing seems to help.  He has been on ibuprofen but it no longer helps.  Rubs, heat and ice do not help.  He has no trauma.   Body mass index is 46.17 kg/m.  The patient meets the AMA guidelines for Morbid (severe) obesity with a BMI > 40.0 and I have recommended weight loss.   ROS  Review of Systems  Constitutional: Positive for activity change.       He has diabetes He has hypertension He has no COPD He does not smoke.  HENT: Negative for congestion.   Respiratory: Negative for cough and shortness of breath.   Cardiovascular: Negative for chest pain and leg swelling.  Endocrine: Positive for cold intolerance.  Musculoskeletal: Positive for arthralgias, gait problem, joint swelling and myalgias.  Allergic/Immunologic: Positive for environmental allergies.  All other systems reviewed and are negative.   All other systems reviewed and are negative.  The following is a summary of the past history medically, past history surgically, known current medicines, social history and family history.  This information is gathered electronically by the computer from prior information and documentation.  I review this each visit and have found including this information at this point in the chart is beneficial and informative.    Past Medical History:  Diagnosis Date  . Diabetes mellitus without complication (HCC)   . Hypertension   . Rotator cuff (capsule) sprain     History reviewed. No pertinent surgical history.  Family History  Problem Relation Age of Onset  .  Hypertension Mother   . Hyperlipidemia Mother   . Cancer Father        throat    Social History Social History   Tobacco Use  . Smoking status: Never Smoker  . Smokeless tobacco: Never Used  Substance Use Topics  . Alcohol use: No  . Drug use: No    Allergies  Allergen Reactions  . Penicillins Anaphylaxis  . Shrimp [Shellfish Allergy]     anaphylaxis  . Tramadol Rash    Possible combination with Zantac    Current Outpatient Medications  Medication Sig Dispense Refill  . amLODipine (NORVASC) 10 MG tablet Take 10 mg by mouth daily.    Marland Kitchen aspirin EC 81 MG tablet Take 81 mg by mouth daily.    Marland Kitchen atenolol (TENORMIN) 50 MG tablet Take 50 mg by mouth daily. for high blood pressure  3  . carvedilol (COREG) 12.5 MG tablet Take 12.5 mg by mouth daily.     Marland Kitchen gabapentin (NEURONTIN) 100 MG capsule Take 1 capsule (100 mg total) by mouth at bedtime. 90 capsule 3  . HYDROcodone-acetaminophen (NORCO) 10-325 MG tablet Take 1 tablet by mouth every 6 (six) hours as needed.    Marland Kitchen ibuprofen (ADVIL,MOTRIN) 800 MG tablet Take 800 mg by mouth every 6 (six) hours as needed for moderate pain.    Marland Kitchen losartan-hydrochlorothiazide (HYZAAR) 100-25 MG tablet Take 1 tablet by mouth daily. for high blood pressure  3  .  lovastatin (MEVACOR) 20 MG tablet Take 20 mg by mouth at bedtime.    . metFORMIN (GLUCOPHAGE-XR) 500 MG 24 hr tablet TAKE 2 TABLETS BY MOUTH TWICE DAILY FOR DIABETES  3  . diclofenac (VOLTAREN) 75 MG EC tablet Take 1 tablet (75 mg total) by mouth 2 (two) times daily with a meal. 60 tablet 2  . DULoxetine (CYMBALTA) 60 MG capsule TAKE 1 CAPSULE BY MOUTH ONCE DAILY FOR 1 2 WEEKS. THEN INCREASE THE DOSE TO ONE CAPSULE TWICE DAILY FOR CHRONIC PAIN  11   Current Facility-Administered Medications  Medication Dose Route Frequency Provider Last Rate Last Dose  . triamcinolone acetonide (KENALOG) 10 MG/ML injection 10 mg  10 mg Other Once Vivi BarrackWagoner, Matthew R, DPM         Physical Exam  Blood pressure  (!) 164/102, pulse (!) 102, height 5\' 11"  (1.803 m), weight (!) 331 lb (150.1 kg).  Constitutional: overall normal hygiene, normal nutrition, well developed, normal grooming, normal body habitus. Assistive device:none  Musculoskeletal: gait and station Limp right, muscle tone and strength are normal, no tremors or atrophy is present.  .  Neurological: coordination overall normal.  Deep tendon reflex/nerve stretch intact.  Sensation normal.  Cranial nerves II-XII intact.   Skin:   Normal overall no scars, lesions, ulcers or rashes. No psoriasis.  Psychiatric: Alert and oriented x 3.  Recent memory intact, remote memory unclear.  Normal mood and affect. Well groomed.  Good eye contact.  Cardiovascular: overall no swelling, no varicosities, no edema bilaterally, normal temperatures of the legs and arms, no clubbing, cyanosis and good capillary refill.  Lymphatic: palpation is normal.  The right lower extremity is examined:  Inspection:  Thigh:  Non-tender and no defects  Knee has swelling 1+ effusion.                        Joint tenderness is present                        Patient is tender over the medial joint line  Lower Leg:  Has normal appearance and no tenderness or defects  Ankle:  Non-tender and no defects  Foot:  Non-tender and no defects Range of Motion:  Knee:  Range of motion is: 0-105                        Crepitus is  present  Ankle:  Range of motion is normal. Strength and Tone:  The right lower extremity has normal strength and tone. Stability:  Knee:  The knee is stable.  Ankle:  The ankle is stable. All other systems reviewed and are negative   The patient has been educated about the nature of the problem(s) and counseled on treatment options.  The patient appeared to understand what I have discussed and is in agreement with it.  Encounter Diagnosis  Name Primary?  . Acute pain of right knee Yes    PLAN Call if any problems.  Precautions discussed.   Continue current medications except for the ibuprofen.  I have changed to diclofenac.  Return in six weeks.  He may need X-rays of the hip or other knee then.     Electronically Signed Darreld McleanWayne Melanie Openshaw, MD 9/17/20193:23 PM

## 2018-01-08 ENCOUNTER — Telehealth: Payer: Self-pay | Admitting: Orthopaedic Surgery

## 2018-01-08 NOTE — Telephone Encounter (Signed)
diclofenac (VOLTAREN) 75 MG EC tablet 60 tablet 2    from [facility] - patient said pharmacist faxed request for pre-authorization, and that they are re-faxing if we did not receive. Please advise.

## 2018-01-08 NOTE — Telephone Encounter (Signed)
Pt informed process has been started and he will be contacted with the outcome.

## 2018-02-11 ENCOUNTER — Encounter: Payer: Self-pay | Admitting: Orthopaedic Surgery

## 2018-02-11 ENCOUNTER — Ambulatory Visit: Payer: Medicaid Other | Admitting: Orthopaedic Surgery

## 2018-02-11 VITALS — BP 133/85 | HR 90 | Ht 71.0 in | Wt 332.0 lb

## 2018-02-11 DIAGNOSIS — Z6841 Body Mass Index (BMI) 40.0 and over, adult: Secondary | ICD-10-CM | POA: Diagnosis not present

## 2018-02-11 DIAGNOSIS — G8929 Other chronic pain: Secondary | ICD-10-CM | POA: Diagnosis not present

## 2018-02-11 DIAGNOSIS — M25561 Pain in right knee: Secondary | ICD-10-CM

## 2018-02-11 NOTE — Progress Notes (Signed)
Patient David Brooks, male DOB:[birth year], 52 y.o. WUJ:[id number]  Chief Complaint  Patient presents with  . Knee Pain    right     HPI  David Brooks is a 52 y.o. male who has chronic pain of the right knee.  He has swelling and popping but no giving way, no locking.He is taking his medicine.  He has no new trauma.  He has more problems with steps.   Body mass index is 46.3 kg/m.  The patient meets the AMA guidelines for Morbid (severe) obesity with a BMI > 40.0 and I have recommended weight loss.   ROS  Review of Systems  Constitutional: Positive for activity change.       He has diabetes He has hypertension He has no COPD He does not smoke.  HENT: Negative for congestion.   Respiratory: Negative for cough and shortness of breath.   Cardiovascular: Negative for chest pain and leg swelling.  Endocrine: Positive for cold intolerance.  Musculoskeletal: Positive for arthralgias, gait problem, joint swelling and myalgias.  Allergic/Immunologic: Positive for environmental allergies.  All other systems reviewed and are negative.   All other systems reviewed and are negative.  The following is a summary of the past history medically, past history surgically, known current medicines, social history and family history.  This information is gathered electronically by the computer from prior information and documentation.  I review this each visit and have found including this information at this point in the chart is beneficial and informative.    Past Medical History:  Diagnosis Date  . Diabetes mellitus without complication (HCC)   . Hypertension   . Rotator cuff (capsule) sprain     History reviewed. No pertinent surgical history.  Family History  Problem Relation Age of Onset  . Hypertension Mother   . Hyperlipidemia Mother   . Cancer Father        throat    Social History Social History   Tobacco Use  . Smoking status: Never Smoker  . Smokeless tobacco:  Never Used  Substance Use Topics  . Alcohol use: No  . Drug use: No    Allergies  Allergen Reactions  . Penicillins Anaphylaxis  . Shrimp [Shellfish Allergy]     anaphylaxis  . Tramadol Rash    Possible combination with Zantac    Current Outpatient Medications  Medication Sig Dispense Refill  . amLODipine (NORVASC) 10 MG tablet Take 10 mg by mouth daily.    Marland Kitchen aspirin EC 81 MG tablet Take 81 mg by mouth daily.    Marland Kitchen atenolol (TENORMIN) 50 MG tablet Take 50 mg by mouth daily. for high blood pressure  3  . carvedilol (COREG) 12.5 MG tablet Take 12.5 mg by mouth daily.     . diclofenac (VOLTAREN) 75 MG EC tablet Take 1 tablet (75 mg total) by mouth 2 (two) times daily with a meal. 60 tablet 2  . DULoxetine (CYMBALTA) 60 MG capsule TAKE 1 CAPSULE BY MOUTH ONCE DAILY FOR 1 2 WEEKS. THEN INCREASE THE DOSE TO ONE CAPSULE TWICE DAILY FOR CHRONIC PAIN  11  . gabapentin (NEURONTIN) 100 MG capsule Take 1 capsule (100 mg total) by mouth at bedtime. 90 capsule 3  . HYDROcodone-acetaminophen (NORCO) 10-325 MG tablet Take 1 tablet by mouth every 6 (six) hours as needed.    Marland Kitchen ibuprofen (ADVIL,MOTRIN) 800 MG tablet Take 800 mg by mouth every 6 (six) hours as needed for moderate pain.    Marland Kitchen losartan-hydrochlorothiazide (HYZAAR)  100-25 MG tablet Take 1 tablet by mouth daily. for high blood pressure  3  . lovastatin (MEVACOR) 20 MG tablet Take 20 mg by mouth at bedtime.    . metFORMIN (GLUCOPHAGE-XR) 500 MG 24 hr tablet TAKE 2 TABLETS BY MOUTH TWICE DAILY FOR DIABETES  3   Current Facility-Administered Medications  Medication Dose Route Frequency Provider Last Rate Last Dose  . triamcinolone acetonide (KENALOG) 10 MG/ML injection 10 mg  10 mg Other Once David Brooks, DPM         Physical Exam  Blood pressure 133/85, pulse 90, height 5\' 11"  (1.803 m), weight (!) 332 lb (150.6 kg).  Constitutional: overall normal hygiene, normal nutrition, well developed, normal grooming, normal body  habitus. Assistive device:none  Musculoskeletal: gait and station Limp right, muscle tone and strength are normal, no tremors or atrophy is present.  .  Neurological: coordination overall normal.  Deep tendon reflex/nerve stretch intact.  Sensation normal.  Cranial nerves II-XII intact.   Skin:   Normal overall no scars, lesions, ulcers or rashes. No psoriasis.  Psychiatric: Alert and oriented x 3.  Recent memory intact, remote memory unclear.  Normal mood and affect. Well groomed.  Good eye contact.  Cardiovascular: overall no swelling, no varicosities, no edema bilaterally, normal temperatures of the legs and arms, no clubbing, cyanosis and good capillary refill.  Lymphatic: palpation is normal.  Right knee has effusion 1+, crepitus, pain medially, ROM 0 to 105, limp right, stable.  NV intact.  All other systems reviewed and are negative   The patient has been educated about the nature of the problem(s) and counseled on treatment options.  The patient appeared to understand what I have discussed and is in agreement with it.  Encounter Diagnoses  Name Primary?  . Chronic pain of right knee Yes  . Body mass index 45.0-49.9, adult (HCC)   . Morbid obesity (HCC)     PLAN Call if any problems.  Precautions discussed.  Continue current medications.   Return to clinic 3 months   Electronically Signed David Mclean, MD 10/29/20192:21 PM

## 2018-05-13 ENCOUNTER — Ambulatory Visit: Payer: Medicaid Other | Admitting: Orthopaedic Surgery

## 2018-05-20 ENCOUNTER — Ambulatory Visit: Payer: Medicaid Other | Admitting: Orthopaedic Surgery

## 2018-05-21 ENCOUNTER — Ambulatory Visit: Payer: Medicaid Other | Admitting: Orthopaedic Surgery

## 2018-05-21 ENCOUNTER — Ambulatory Visit (INDEPENDENT_AMBULATORY_CARE_PROVIDER_SITE_OTHER): Payer: Medicaid Other

## 2018-05-21 ENCOUNTER — Encounter: Payer: Self-pay | Admitting: Orthopaedic Surgery

## 2018-05-21 VITALS — BP 145/89 | HR 89 | Ht 71.0 in | Wt 335.0 lb

## 2018-05-21 DIAGNOSIS — M25512 Pain in left shoulder: Secondary | ICD-10-CM

## 2018-05-21 DIAGNOSIS — G8929 Other chronic pain: Secondary | ICD-10-CM

## 2018-05-21 NOTE — Patient Instructions (Signed)
..  Your MRI has been ordered.  We will contact your insurance company for approval. After the authorization is received the MRI and a return appointment will be scheduled with you by phone.  If you do not hear from us within 5 business days please call [phone] and ask for the pre-authorization representative in our office.  

## 2018-05-21 NOTE — Progress Notes (Signed)
Patient David Brooks, male DOB:[birth year], 53 y.o. VZD:[id number]  Chief Complaint  Patient presents with  . Shoulder Pain    HPI  David Brooks is a 53 y.o. male who has left shoulder pain that is getting worse.  He has no trauma.  He has pain with lifting his arm over head and also on rolling on it at night.  He has not been helped with pain medicine, Advil, heat, ice or rubs.  He has no numbness.  He has no swelling.  I will get MRI of the shoulder.   Body mass index is 46.72 kg/m.  The patient meets the AMA guidelines for Morbid (severe) obesity with a BMI > 40.0 and I have recommended weight loss.   ROS  Review of Systems  Constitutional: Positive for activity change.       He has diabetes He has hypertension He has no COPD He does not smoke.  HENT: Negative for congestion.   Respiratory: Negative for cough and shortness of breath.   Cardiovascular: Negative for chest pain and leg swelling.  Endocrine: Positive for cold intolerance.  Musculoskeletal: Positive for arthralgias, gait problem, joint swelling and myalgias.  Allergic/Immunologic: Positive for environmental allergies.  All other systems reviewed and are negative.   All other systems reviewed and are negative.  The following is a summary of the past history medically, past history surgically, known current medicines, social history and family history.  This information is gathered electronically by the computer from prior information and documentation.  I review this each visit and have found including this information at this point in the chart is beneficial and informative.    Past Medical History:  Diagnosis Date  . Diabetes mellitus without complication (HCC)   . Hypertension   . Rotator cuff (capsule) sprain     History reviewed. No pertinent surgical history.  Family History  Problem Relation Age of Onset  . Hypertension Mother   . Hyperlipidemia Mother   . Cancer Father        throat     Social History Social History   Tobacco Use  . Smoking status: Never Smoker  . Smokeless tobacco: Never Used  Substance Use Topics  . Alcohol use: No  . Drug use: No    Allergies  Allergen Reactions  . Penicillins Anaphylaxis  . Shrimp [Shellfish Allergy]     anaphylaxis  . Tramadol Rash    Possible combination with Zantac    Current Outpatient Medications  Medication Sig Dispense Refill  . amLODipine (NORVASC) 10 MG tablet Take 10 mg by mouth daily.    Marland Kitchen aspirin EC 81 MG tablet Take 81 mg by mouth daily.    Marland Kitchen atenolol (TENORMIN) 50 MG tablet Take 50 mg by mouth daily. for high blood pressure  3  . carvedilol (COREG) 12.5 MG tablet Take 12.5 mg by mouth daily.     . diclofenac (VOLTAREN) 75 MG EC tablet Take 1 tablet (75 mg total) by mouth 2 (two) times daily with a meal. 60 tablet 2  . DULoxetine (CYMBALTA) 60 MG capsule TAKE 1 CAPSULE BY MOUTH ONCE DAILY FOR 1 2 WEEKS. THEN INCREASE THE DOSE TO ONE CAPSULE TWICE DAILY FOR CHRONIC PAIN  11  . gabapentin (NEURONTIN) 100 MG capsule Take 1 capsule (100 mg total) by mouth at bedtime. 90 capsule 3  . HYDROcodone-acetaminophen (NORCO) 10-325 MG tablet Take 1 tablet by mouth every 6 (six) hours as needed.    Marland Kitchen ibuprofen (ADVIL,MOTRIN) 800  MG tablet Take 800 mg by mouth every 6 (six) hours as needed for moderate pain.    Marland Kitchen. losartan-hydrochlorothiazide (HYZAAR) 100-25 MG tablet Take 1 tablet by mouth daily. for high blood pressure  3  . lovastatin (MEVACOR) 20 MG tablet Take 20 mg by mouth at bedtime.    . metFORMIN (GLUCOPHAGE-XR) 500 MG 24 hr tablet TAKE 2 TABLETS BY MOUTH TWICE DAILY FOR DIABETES  3   Current Facility-Administered Medications  Medication Dose Route Frequency Provider Last Rate Last Dose  . triamcinolone acetonide (KENALOG) 10 MG/ML injection 10 mg  10 mg Other Once Vivi BarrackWagoner, Matthew R, DPM         Physical Exam  Blood pressure (!) 145/89, pulse 89, height 5\' 11"  (1.803 m), weight (!) 335 lb (152  kg).  Constitutional: overall normal hygiene, normal nutrition, well developed, normal grooming, normal body habitus. Assistive device:none  Musculoskeletal: gait and station Limp none, muscle tone and strength are normal, no tremors or atrophy is present.  .  Neurological: coordination overall normal.  Deep tendon reflex/nerve stretch intact.  Sensation normal.  Cranial nerves II-XII intact.   Skin:   Normal overall no scars, lesions, ulcers or rashes. No psoriasis.  Psychiatric: Alert and oriented x 3.  Recent memory intact, remote memory unclear.  Normal mood and affect. Well groomed.  Good eye contact.  Cardiovascular: overall no swelling, no varicosities, no edema bilaterally, normal temperatures of the legs and arms, no clubbing, cyanosis and good capillary refill.  Lymphatic: palpation is normal.  Examination of left Upper Extremity is done.  Inspection:   Overall:  Elbow non-tender without crepitus or defects, forearm non-tender without crepitus or defects, wrist non-tender without crepitus or defects, hand non-tender.    Shoulder: with glenohumeral joint tenderness, without effusion.   Upper arm: without swelling and tenderness   Range of motion:   Overall:  Full range of motion of the elbow, full range of motion of wrist and full range of motion in fingers.   Shoulder:  left  120 degrees forward flexion; 90 degrees abduction; 30 degrees internal rotation, 30 degrees external rotation, 15 degrees extension, 40 degrees adduction.   Stability:   Overall:  Shoulder, elbow and wrist stable   Strength and Tone:   Overall full shoulder muscles strength, full upper arm strength and normal upper arm bulk and tone.  All other systems reviewed and are negative   The patient has been educated about the nature of the problem(s) and counseled on treatment options.  The patient appeared to understand what I have discussed and is in agreement with it.  Encounter Diagnosis  Name  Primary?  . Chronic left shoulder pain Yes    PLAN Call if any problems.  Precautions discussed.  Continue current medications.   Return to clinic MRI left shoulder   PROCEDURE NOTE:  The patient request injection, verbal consent was obtained.  The left shoulder was prepped appropriately after time out was performed.   Sterile technique was observed and injection of 1 cc of Depo-Medrol 40 mg with several cc's of plain xylocaine. Anesthesia was provided by ethyl chloride and a 20-gauge needle was used to inject the shoulder area. A posterior approach was used.  The injection was tolerated well.  A band aid dressing was applied.  The patient was advised to apply ice later today and tomorrow to the injection sight as needed.  Electronically Signed Darreld McleanWayne Carvel Huskins, MD 2/5/20203:25 PM

## 2018-06-04 ENCOUNTER — Encounter: Payer: Self-pay | Admitting: Orthopaedic Surgery

## 2018-06-04 ENCOUNTER — Ambulatory Visit: Payer: Medicaid Other | Admitting: Orthopaedic Surgery

## 2018-06-04 VITALS — BP 153/91 | HR 88 | Ht 71.0 in | Wt 339.0 lb

## 2018-06-04 DIAGNOSIS — Z6841 Body Mass Index (BMI) 40.0 and over, adult: Secondary | ICD-10-CM | POA: Diagnosis not present

## 2018-06-04 DIAGNOSIS — M25512 Pain in left shoulder: Secondary | ICD-10-CM

## 2018-06-04 DIAGNOSIS — M75122 Complete rotator cuff tear or rupture of left shoulder, not specified as traumatic: Secondary | ICD-10-CM

## 2018-06-04 DIAGNOSIS — G8929 Other chronic pain: Secondary | ICD-10-CM

## 2018-06-04 NOTE — Progress Notes (Signed)
Patient David Brooks, male DOB:[birth year], 53 y.o. David Brooks  Chief Complaint  Patient presents with  . Shoulder Pain    HPI  David Brooks is a 53 y.o. male who has left shoulder pain.  He had a MRI which showed a tear of the supraspinatus tendon with retraction and partial tear of the infraspinatus tendon.  He has bursitis as well.  I have explained the findings to him.  He does not want to do any surgery.  He says his insurance will not pay for PT.  He wants to do exercises at home and continue his medicines for now.   Body mass index is 47.28 kg/m.  The patient meets the AMA guidelines for Morbid (severe) obesity with a BMI > 40.0 and I have recommended weight loss.   ROS  Review of Systems  All other systems reviewed and are negative.  The following is a summary of the past history medically, past history surgically, known current medicines, social history and family history.  This information is gathered electronically by the computer from prior information and documentation.  I review this each visit and have found including this information at this point in the chart is beneficial and informative.    Past Medical History:  Diagnosis Date  . Diabetes mellitus without complication (HCC)   . Hypertension   . Rotator cuff (capsule) sprain     History reviewed. No pertinent surgical history.  Family History  Problem Relation Age of Onset  . Hypertension Mother   . Hyperlipidemia Mother   . Cancer Father        throat    Social History Social History   Tobacco Use  . Smoking status: Never Smoker  . Smokeless tobacco: Never Used  Substance Use Topics  . Alcohol use: No  . Drug use: No    Allergies  Allergen Reactions  . Penicillins Anaphylaxis  . Shrimp [Shellfish Allergy]     anaphylaxis  . Tramadol Rash    Possible combination with Zantac    Current Outpatient Medications  Medication Sig Dispense Refill  . amLODipine (NORVASC) 10 MG  tablet Take 10 mg by mouth daily.    Marland Kitchen aspirin EC 81 MG tablet Take 81 mg by mouth daily.    Marland Kitchen atenolol (TENORMIN) 50 MG tablet Take 50 mg by mouth daily. for high blood pressure  3  . carvedilol (COREG) 12.5 MG tablet Take 12.5 mg by mouth daily.     . diclofenac (VOLTAREN) 75 MG EC tablet Take 1 tablet (75 mg total) by mouth 2 (two) times daily with a meal. 60 tablet 2  . DULoxetine (CYMBALTA) 60 MG capsule TAKE 1 CAPSULE BY MOUTH ONCE DAILY FOR 1 2 WEEKS. THEN INCREASE THE DOSE TO ONE CAPSULE TWICE DAILY FOR CHRONIC PAIN  11  . gabapentin (NEURONTIN) 100 MG capsule Take 1 capsule (100 mg total) by mouth at bedtime. 90 capsule 3  . HYDROcodone-acetaminophen (NORCO) 10-325 MG tablet Take 1 tablet by mouth every 6 (six) hours as needed.    Marland Kitchen ibuprofen (ADVIL,MOTRIN) 800 MG tablet Take 800 mg by mouth every 6 (six) hours as needed for moderate pain.    Marland Kitchen losartan-hydrochlorothiazide (HYZAAR) 100-25 MG tablet Take 1 tablet by mouth daily. for high blood pressure  3  . lovastatin (MEVACOR) 20 MG tablet Take 20 mg by mouth at bedtime.    . metFORMIN (GLUCOPHAGE-XR) 500 MG 24 hr tablet TAKE 2 TABLETS BY MOUTH TWICE DAILY FOR DIABETES  3  Current Facility-Administered Medications  Medication Dose Route Frequency Provider Last Rate Last Dose  . triamcinolone acetonide (KENALOG) 10 MG/ML injection 10 mg  10 mg Other Once Vivi Barrack, DPM         Physical Exam  Blood pressure (!) 153/91, pulse 88, height 5\' 11"  (1.803 m), weight (!) 339 lb (153.8 kg).  Constitutional: overall normal hygiene, normal nutrition, well developed, normal grooming, normal body habitus. Assistive device:none  Musculoskeletal: gait and station Limp none, muscle tone and strength are normal, no tremors or atrophy is present.  .  Neurological: coordination overall normal.  Deep tendon reflex/nerve stretch intact.  Sensation normal.  Cranial nerves II-XII intact.   Skin:   Normal overall no scars, lesions, ulcers or  rashes. No psoriasis.  Psychiatric: Alert and oriented x 3.  Recent memory intact, remote memory unclear.  Normal mood and affect. Well groomed.  Good eye contact.  Cardiovascular: overall no swelling, no varicosities, no edema bilaterally, normal temperatures of the legs and arms, no clubbing, cyanosis and good capillary refill.  Lymphatic: palpation is normal.  Left shoulder has full motion but pain in the extremes.  NV intact.  All other systems reviewed and are negative   The patient has been educated about the nature of the problem(s) and counseled on treatment options.  The patient appeared to understand what I have discussed and is in agreement with it.  Encounter Diagnoses  Name Primary?  Marland Kitchen Nontraumatic complete tear of left rotator cuff Yes  . Chronic left shoulder pain   . Body mass index 45.0-49.9, adult (HCC)   . Morbid obesity (HCC)     PLAN Call if any problems.  Precautions discussed.  Continue current medications.   Return to clinic as needed   Electronically Signed Darreld Mclean, MD 2/19/20203:37 PM

## 2018-06-07 ENCOUNTER — Other Ambulatory Visit: Payer: Medicaid Other

## 2018-06-21 ENCOUNTER — Encounter (HOSPITAL_COMMUNITY): Payer: Self-pay | Admitting: Emergency Medicine

## 2018-06-21 ENCOUNTER — Emergency Department (HOSPITAL_COMMUNITY)
Admission: EM | Admit: 2018-06-21 | Discharge: 2018-06-21 | Disposition: A | Discharge status: Home / Self Care | Payer: Medicaid Other | Attending: Emergency Medicine | Admitting: Emergency Medicine

## 2018-06-21 DIAGNOSIS — Z7982 Long term (current) use of aspirin: Secondary | ICD-10-CM | POA: Insufficient documentation

## 2018-06-21 DIAGNOSIS — Z79899 Other long term (current) drug therapy: Secondary | ICD-10-CM | POA: Insufficient documentation

## 2018-06-21 DIAGNOSIS — I10 Essential (primary) hypertension: Secondary | ICD-10-CM | POA: Diagnosis not present

## 2018-06-21 DIAGNOSIS — Z7984 Long term (current) use of oral hypoglycemic drugs: Secondary | ICD-10-CM | POA: Diagnosis not present

## 2018-06-21 DIAGNOSIS — E1140 Type 2 diabetes mellitus with diabetic neuropathy, unspecified: Secondary | ICD-10-CM | POA: Insufficient documentation

## 2018-06-21 DIAGNOSIS — E119 Type 2 diabetes mellitus without complications: Secondary | ICD-10-CM | POA: Diagnosis not present

## 2018-06-21 DIAGNOSIS — K625 Hemorrhage of anus and rectum: Secondary | ICD-10-CM

## 2018-06-21 LAB — CBC WITH DIFFERENTIAL/PLATELET
Abs Immature Granulocytes: 0.02 10*3/uL (ref 0.00–0.07)
Basophils Absolute: 0 10*3/uL (ref 0.0–0.1)
Basophils Relative: 1 %
Eosinophils Absolute: 0.1 10*3/uL (ref 0.0–0.5)
Eosinophils Relative: 1 %
HCT: 44 % (ref 39.0–52.0)
Hemoglobin: 14.2 g/dL (ref 13.0–17.0)
Immature Granulocytes: 0 %
Lymphocytes Relative: 21 %
Lymphs Abs: 1.3 10*3/uL (ref 0.7–4.0)
MCH: 27.8 pg (ref 26.0–34.0)
MCHC: 32.3 g/dL (ref 30.0–36.0)
MCV: 86.3 fL (ref 80.0–100.0)
Monocytes Absolute: 0.6 10*3/uL (ref 0.1–1.0)
Monocytes Relative: 9 %
Neutro Abs: 4.1 10*3/uL (ref 1.7–7.7)
Neutrophils Relative %: 68 %
Platelets: 240 10*3/uL (ref 150–400)
RBC: 5.1 MIL/uL (ref 4.22–5.81)
RDW: 12.5 % (ref 11.5–15.5)
WBC: 6.1 10*3/uL (ref 4.0–10.5)
nRBC: 0 % (ref 0.0–0.2)

## 2018-06-21 LAB — BASIC METABOLIC PANEL
Anion gap: 10 (ref 5–15)
BUN: 16 mg/dL (ref 6–20)
CO2: 27 mmol/L (ref 22–32)
Calcium: 8.6 mg/dL — ABNORMAL LOW (ref 8.9–10.3)
Chloride: 96 mmol/L — ABNORMAL LOW (ref 98–111)
Creatinine, Ser: 0.99 mg/dL (ref 0.61–1.24)
GFR calc Af Amer: >60 mL/min (ref >60)
GFR calc non Af Amer: >60 mL/min (ref >60)
Glucose, Bld: 218 mg/dL — ABNORMAL HIGH (ref 70–99)
Potassium: 3.1 mmol/L — ABNORMAL LOW (ref 3.5–5.1)
Sodium: 133 mmol/L — ABNORMAL LOW (ref 135–145)

## 2018-06-21 LAB — POC OCCULT BLOOD, ED: Fecal Occult Bld: NEGATIVE

## 2018-06-21 NOTE — ED Provider Notes (Signed)
[facility] EMERGENCY DEPARTMENT Provider Note   CSN: [id number] Arrival date & time: 06/21/18  4098    History   Chief Complaint Chief Complaint  Patient presents with  . Rectal Bleeding    HPI David Brooks is a 53 y.o. male.     HPI   53 year old male with rectal bleeding.  Bright red blood.  2 episodes.  One yesterday once again this morning.  Bright red to dark red blood mixed in with the stool.  Crampy periumbilical pain.  No dizziness, lightheadedness or shortness of breath.  Denies any past history of significant GI bleeding.  He is never had a colonoscopy.  Aspirin.  Past Medical History:  Diagnosis Date  . Diabetes mellitus without complication (HCC)   . Hypertension   . Rotator cuff (capsule) sprain     Patient Active Problem List   Diagnosis Date Noted  . Plantar fasciitis of right foot 05/18/2016  . Neuropathy 05/18/2016  . Right shoulder pain 06/22/2015  . DM (diabetes mellitus) (HCC) 10/22/2012  . Essential hypertension, benign 10/22/2012  . Obstructive sleep apnea 10/22/2012  . Hypogonadism, male 10/22/2012    History reviewed. No pertinent surgical history.      Home Medications    Prior to Admission medications   Medication Sig Start Date End Date Taking? Authorizing Provider  amLODipine (NORVASC) 10 MG tablet Take 10 mg by mouth daily.    [provider]  aspirin EC 81 MG tablet Take 81 mg by mouth daily.    [provider]  atenolol (TENORMIN) 50 MG tablet Take 50 mg by mouth daily. for high blood pressure 10/27/17   [provider]  carvedilol (COREG) 12.5 MG tablet Take 12.5 mg by mouth daily.  02/22/11   [provider]  diclofenac (VOLTAREN) 75 MG EC tablet Take 1 tablet (75 mg total) by mouth 2 (two) times daily with a meal. 12/31/17   Darreld Mclean, MD  DULoxetine (CYMBALTA) 60 MG capsule TAKE 1 CAPSULE BY MOUTH ONCE DAILY FOR 1 2 WEEKS. THEN INCREASE THE DOSE TO ONE CAPSULE TWICE DAILY FOR CHRONIC  PAIN 12/15/17   [provider]  gabapentin (NEURONTIN) 100 MG capsule Take 1 capsule (100 mg total) by mouth at bedtime. 12/05/15   Vivi Barrack, DPM  HYDROcodone-acetaminophen (NORCO) 10-325 MG tablet Take 1 tablet by mouth every 6 (six) hours as needed.    [provider]  ibuprofen (ADVIL,MOTRIN) 800 MG tablet Take 800 mg by mouth every 6 (six) hours as needed for moderate pain.    [provider]  losartan-hydrochlorothiazide (HYZAAR) 100-25 MG tablet Take 1 tablet by mouth daily. for high blood pressure 10/27/17   [provider]  lovastatin (MEVACOR) 20 MG tablet Take 20 mg by mouth at bedtime.    [provider]  metFORMIN (GLUCOPHAGE-XR) 500 MG 24 hr tablet TAKE 2 TABLETS BY MOUTH TWICE DAILY FOR DIABETES 10/14/17   [provider]    Family History Family History  Problem Relation Age of Onset  . Hypertension Mother   . Hyperlipidemia Mother   . Cancer Father        throat    Social History Social History   Tobacco Use  . Smoking status: Never Smoker  . Smokeless tobacco: Never Used  Substance Use Topics  . Alcohol use: No  . Drug use: No     Allergies   Penicillins; Shrimp [shellfish allergy]; and Tramadol   Review of Systems Review of Systems  All systems reviewed and negative, other than as noted in HPI.  Physical Exam Updated Vital Signs BP (!) 147/87   Pulse 85   Temp 98.5 F (36.9 C) (Oral)   Resp 18   Ht 5\' 11"  (1.803 m)   Wt (!) 149.7 kg   SpO2 100%   BMI 46.03 kg/m   Physical Exam Vitals signs and nursing note reviewed.  Constitutional:      General: He is not in acute distress.    Appearance: He is well-developed.  HENT:     Head: Normocephalic and atraumatic.  Eyes:     General:        Right eye: No discharge.        Left eye: No discharge.     Conjunctiva/sclera: Conjunctivae normal.  Neck:     Musculoskeletal: Neck supple.  Cardiovascular:     Rate and Rhythm: Normal rate  and regular rhythm.     Heart sounds: Normal heart sounds. No murmur. No friction rub. No gallop.   Pulmonary:     Effort: Pulmonary effort is normal. No respiratory distress.     Breath sounds: Normal breath sounds.  Abdominal:     General: There is no distension.     Palpations: Abdomen is soft.     Tenderness: There is no abdominal tenderness.  Genitourinary:    Comments: No hemorrhoids or other concerning lesions or masses palpated.  Normal tone.  Brown appearing stool.  Hemoccult negative. Musculoskeletal:        General: No tenderness.  Skin:    General: Skin is warm and dry.  Neurological:     Mental Status: He is alert.  Psychiatric:        Behavior: Behavior normal.        Thought Content: Thought content normal.      ED Treatments / Results  Labs (all labs ordered are listed, but only abnormal results are displayed) Labs Reviewed  BASIC METABOLIC PANEL - Abnormal; Notable for the following components:      Result Value   Sodium 133 (*)    Potassium 3.1 (*)    Chloride 96 (*)    Glucose, Bld 218 (*)    Calcium 8.6 (*)    All other components within normal limits  CBC WITH DIFFERENTIAL/PLATELET  POC OCCULT BLOOD, ED    EKG None  Radiology No results found.  Procedures Procedures (including critical care time)  Medications Ordered in ED Medications - No data to display   Initial Impression / Assessment and Plan / ED Course  I have reviewed the triage vital signs and the nursing notes.  Pertinent labs & imaging results that were available during my care of the patient were reviewed by me and considered in my medical decision making (see chart for details).        53 year old male with rectal bleeding.  He is only on baby aspirin.  Hemodynamically stable.  Normal H/H.  Brown stool on exam which was Hemoccult negative.  At this point, I feel he is medically safe for outpatient follow-up.  He has never had a colonoscopy.  He should have one at his age  for screening purposes alone but now since he is having some bleeding there is all the more reason.  Return precautions were discussed.  Outpatient follow-up otherwise.  Final Clinical Impressions(s) / ED Diagnoses   Final diagnoses:  Rectal bleeding    ED Discharge Orders    None  Raeford Razor, MD 06/21/18 1130

## 2018-06-21 NOTE — ED Notes (Signed)
OCCULT BLOOD CARD TO BEDSIDE

## 2018-06-21 NOTE — ED Triage Notes (Signed)
Pt reports bright red blood in stool yesterday and today.  States the stool is solid with blood in it and when wiping.  Denies having to strain.

## 2018-07-01 ENCOUNTER — Telehealth: Payer: Self-pay

## 2018-07-01 NOTE — Telephone Encounter (Signed)
Noted  

## 2018-07-01 NOTE — Telephone Encounter (Signed)
Covid-19 travel screening questions  Have you traveled in the last 14 days? If yes where?  Do you now or have you had a fever in the last 14 days?  Do you have any respiratory symptoms of shortness of breath or cough now or in the last 14 days?  Do you have a medical history of Congestive Heart Failure?  Do you have a medical history of lung disease?  Do you have any family members or close contacts with diagnosed or suspected Covid-19?   Left message on voicemail for the pt to call back and reschedule their OV if they answer yes to any of the above questions.

## 2018-07-01 NOTE — Telephone Encounter (Signed)
Hi Linda, just to let you know that this pt r/s his appt to 4/23. He stated to feel fine but wanted to r/s anyway as a precaution.

## 2018-07-02 ENCOUNTER — Ambulatory Visit: Payer: Medicaid Other | Admitting: Internal Medicine

## 2018-08-07 ENCOUNTER — Ambulatory Visit: Payer: Medicaid Other | Admitting: Internal Medicine

## 2018-08-13 ENCOUNTER — Other Ambulatory Visit: Payer: Self-pay

## 2018-08-13 ENCOUNTER — Encounter: Payer: Self-pay | Admitting: Orthopaedic Surgery

## 2018-08-13 ENCOUNTER — Ambulatory Visit: Payer: Medicaid Other | Admitting: Orthopaedic Surgery

## 2018-08-13 ENCOUNTER — Ambulatory Visit (INDEPENDENT_AMBULATORY_CARE_PROVIDER_SITE_OTHER): Payer: Medicaid Other

## 2018-08-13 VITALS — BP 161/96 | HR 84 | Temp 96.8°F | Ht 71.0 in | Wt 337.0 lb

## 2018-08-13 DIAGNOSIS — G8929 Other chronic pain: Secondary | ICD-10-CM

## 2018-08-13 DIAGNOSIS — M25511 Pain in right shoulder: Secondary | ICD-10-CM

## 2018-08-13 DIAGNOSIS — Z6841 Body Mass Index (BMI) 40.0 and over, adult: Secondary | ICD-10-CM

## 2018-08-13 MED ORDER — OXYCODONE-ACETAMINOPHEN 7.5-325 MG PO TABS: 1.0000 | ORAL_TABLET | ORAL | 0 refills | Status: AC | PRN

## 2018-08-13 NOTE — Progress Notes (Signed)
Patient David Brooks David Brooks, male DOB:10-24-1965, 53 y.o. WUJ:[id number]  Chief Complaint  Patient presents with  . Shoulder Pain    Rigjt    HPI  David Brooks is a 53 y.o. male who has increased pain in the right shoulder over the last few days.  He was using his lawnmower and he felt a pop in the right shoulder and then had marked pain.  He was unable to use his shoulder.  He put ice on it, took his pain medicine but it still hurts.  He can raise it up some but has pain and inability to abduct the shoulder.  He has pain laying down at night.  His pain medicine does not help that much.     Body mass index is 47 kg/m.  The patient meets the AMA guidelines for Morbid (severe) obesity with a BMI > 40.0 and I have recommended weight loss.   ROS  Review of Systems  Constitutional: Positive for activity change.  Musculoskeletal: Positive for arthralgias.  All other systems reviewed and are negative.  For Review of Systems, all other systems reviewed and are negative.  The following is a summary of the past history medically, past history surgically, known current medicines, social history and family history.  This information is gathered electronically by the computer from prior information and documentation.  I review this each visit and have found including this information at this point in the chart is beneficial and informative.   Past Medical History:  Diagnosis Date  . Diabetes mellitus without complication (HCC)   . Hypertension   . Rotator cuff (capsule) sprain     No past surgical history on file.  Current Outpatient Medications on File Prior to Visit  Medication Sig Dispense Refill  . amLODipine (NORVASC) 10 MG tablet Take 10 mg by mouth daily.    Marland Kitchen aspirin EC 81 MG tablet Take 81 mg by mouth daily.    Marland Kitchen atenolol (TENORMIN) 50 MG tablet Take 50 mg by mouth daily. for high blood pressure  3  . DULoxetine (CYMBALTA) 60 MG capsule TAKE 1 CAPSULE BY MOUTH ONCE DAILY FOR 1  2 WEEKS. THEN INCREASE THE DOSE TO ONE CAPSULE TWICE DAILY FOR CHRONIC PAIN  11  . glimepiride (AMARYL) 1 MG tablet Take 1 tablet by mouth daily.    . hydrochlorothiazide (HYDRODIURIL) 25 MG tablet Take 25 mg by mouth daily. for high blood pressure    . HYDROcodone-acetaminophen (NORCO) 10-325 MG tablet Take 1 tablet by mouth every 6 (six) hours as needed.    Marland Kitchen ibuprofen (ADVIL,MOTRIN) 800 MG tablet Take 800 mg by mouth every 6 (six) hours as needed for moderate pain.    Marland Kitchen losartan-hydrochlorothiazide (HYZAAR) 100-25 MG tablet Take 1 tablet by mouth daily. for high blood pressure  3  . lovastatin (MEVACOR) 10 MG tablet Take 1 tablet by mouth daily.    Marland Kitchen lovastatin (MEVACOR) 20 MG tablet Take 20 mg by mouth at bedtime.    . metFORMIN (GLUCOPHAGE-XR) 500 MG 24 hr tablet TAKE 2 TABLETS BY MOUTH TWICE DAILY FOR DIABETES  3   Current Facility-Administered Medications on File Prior to Visit  Medication Dose Route Frequency Provider Last Rate Last Dose  . triamcinolone acetonide (KENALOG) 10 MG/ML injection 10 mg  10 mg Other Once Vivi Barrack, DPM        Social History   Socioeconomic History  . Marital status: Single    Spouse name: Not on file  . Number  of children: Not on file  . Years of education: Not on file  . Highest education level: Not on file  Occupational History  . Not on file  Social Needs  . Financial resource strain: Not on file  . Food insecurity:    Worry: Not on file    Inability: Not on file  . Transportation needs:    Medical: Not on file    Non-medical: Not on file  Tobacco Use  . Smoking status: Never Smoker  . Smokeless tobacco: Never Used  Substance and Sexual Activity  . Alcohol use: No  . Drug use: No  . Sexual activity: Not on file  Lifestyle  . Physical activity:    Days per week: Not on file    Minutes per session: Not on file  . Stress: Not on file  Relationships  . Social connections:    Talks on phone: Not on file    Gets together: Not  on file    Attends religious service: Not on file    Active member of club or organization: Not on file    Attends meetings of clubs or organizations: Not on file    Relationship status: Not on file  . Intimate partner violence:    Fear of current or ex partner: Not on file    Emotionally abused: Not on file    Physically abused: Not on file    Forced sexual activity: Not on file  Other Topics Concern  . Not on file  Social History Narrative  . Not on file    Family History  Problem Relation Age of Onset  . Hypertension Mother   . Hyperlipidemia Mother   . Cancer Father        throat    BP (!) 161/96   Pulse 84   Ht 5\' 11"  (1.803 m)   Wt (!) 337 lb (152.9 kg)   BMI 47.00 kg/m   Body mass index is 47 kg/m.  All other systems reviewed and are negative.  The following is a summary of the past history medically, past history surgically, known current medicines, social history and family history.  This information is gathered electronically by the computer from prior information and documentation.  I review this each visit and have found including this information at this point in the chart is beneficial and informative.    Past Medical History:  Diagnosis Date  . Diabetes mellitus without complication (HCC)   . Hypertension   . Rotator cuff (capsule) sprain     No past surgical history on file.  Family History  Problem Relation Age of Onset  . Hypertension Mother   . Hyperlipidemia Mother   . Cancer Father        throat    Social History Social History   Tobacco Use  . Smoking status: Never Smoker  . Smokeless tobacco: Never Used  Substance Use Topics  . Alcohol use: No  . Drug use: No    Allergies  Allergen Reactions  . Penicillins Anaphylaxis  . Shrimp [Shellfish Allergy]     anaphylaxis  . Tramadol Rash    Possible combination with Zantac    Current Outpatient Medications  Medication Sig Dispense Refill  . amLODipine (NORVASC) 10 MG tablet  Take 10 mg by mouth daily.    Marland Kitchen. aspirin EC 81 MG tablet Take 81 mg by mouth daily.    Marland Kitchen. atenolol (TENORMIN) 50 MG tablet Take 50 mg by mouth daily. for high blood  pressure  3  . DULoxetine (CYMBALTA) 60 MG capsule TAKE 1 CAPSULE BY MOUTH ONCE DAILY FOR 1 2 WEEKS. THEN INCREASE THE DOSE TO ONE CAPSULE TWICE DAILY FOR CHRONIC PAIN  11  . glimepiride (AMARYL) 1 MG tablet Take 1 tablet by mouth daily.    . hydrochlorothiazide (HYDRODIURIL) 25 MG tablet Take 25 mg by mouth daily. for high blood pressure    . HYDROcodone-acetaminophen (NORCO) 10-325 MG tablet Take 1 tablet by mouth every 6 (six) hours as needed.    Marland Kitchen ibuprofen (ADVIL,MOTRIN) 800 MG tablet Take 800 mg by mouth every 6 (six) hours as needed for moderate pain.    Marland Kitchen losartan-hydrochlorothiazide (HYZAAR) 100-25 MG tablet Take 1 tablet by mouth daily. for high blood pressure  3  . lovastatin (MEVACOR) 10 MG tablet Take 1 tablet by mouth daily.    Marland Kitchen lovastatin (MEVACOR) 20 MG tablet Take 20 mg by mouth at bedtime.    . metFORMIN (GLUCOPHAGE-XR) 500 MG 24 hr tablet TAKE 2 TABLETS BY MOUTH TWICE DAILY FOR DIABETES  3   Current Facility-Administered Medications  Medication Dose Route Frequency Provider Last Rate Last Dose  . triamcinolone acetonide (KENALOG) 10 MG/ML injection 10 mg  10 mg Other Once Vivi Barrack, DPM         Physical Exam  Blood pressure (!) 161/96, pulse 84, height 5\' 11"  (1.803 m), weight (!) 337 lb (152.9 kg).  Constitutional: overall normal hygiene, normal nutrition, well developed, normal grooming, normal body habitus. Assistive device:none  Musculoskeletal: gait and station Limp none, muscle tone and strength are normal, no tremors or atrophy is present.  .  Neurological: coordination overall normal.  Deep tendon reflex/nerve stretch intact.  Sensation normal.  Cranial nerves II-XII intact.   Skin:   Normal overall no scars, lesions, ulcers or rashes. No psoriasis.  Psychiatric: Alert and oriented x 3.   Recent memory intact, remote memory unclear.  Normal mood and affect. Well groomed.  Good eye contact.  Cardiovascular: overall no swelling, no varicosities, no edema bilaterally, normal temperatures of the legs and arms, no clubbing, cyanosis and good capillary refill.  Lymphatic: palpation is normal.  Right shoulder has pain and very decreased motion.  Forward is 80, abduction is 30, internal is 20, external is 10, extension 5, adduction 20.  He has no effusion.  NV intact.  ROM of neck is full.  All other systems reviewed and are negative   The patient has been educated about the nature of the problem(s) and counseled on treatment options.  The patient appeared to understand what I have discussed and is in agreement with it.  X-rays were done of the right shoulder, reported separately.  Encounter Diagnoses  Name Primary?  . Chronic right shoulder pain Yes  . Body mass index 45.0-49.9, adult (HCC)   . Morbid obesity (HCC)   He has now also acute pain of the right shoulder.  I am concerned about a rotator cuff tear.  He will need MRI.  I will order MRI but they are not doing now secondary to COVID-19 problem.  He will at least be on the list to have it done.  He is aware.  I will give pain medicine.  Dr. Sudie Bailey usually gives, I will give 5 days until he can see Dr. Sudie Bailey on Friday.  PROCEDURE NOTE:  The patient request injection, verbal consent was obtained.  The right shoulder was prepped appropriately after time out was performed.   Sterile technique was  observed and injection of 1 cc of Depo-Medrol 40 mg with several cc's of plain xylocaine. Anesthesia was provided by ethyl chloride and a 20-gauge needle was used to inject the shoulder area. A posterior approach was used.  The injection was tolerated well.  A band aid dressing was applied.  The patient was advised to apply ice later today and tomorrow to the injection sight as needed.     PLAN Call if any problems.   Precautions discussed.  Continue current medications.   Return to clinic 1 week   Do Virtual visit in one week.  Get MRI when available.  Electronically Signed Darreld Mclean, MD 4/29/20208:40 AM

## 2018-08-20 ENCOUNTER — Encounter: Payer: Self-pay | Admitting: Orthopaedic Surgery

## 2018-08-20 ENCOUNTER — Ambulatory Visit (INDEPENDENT_AMBULATORY_CARE_PROVIDER_SITE_OTHER): Payer: Medicaid Other | Admitting: Orthopaedic Surgery

## 2018-08-20 ENCOUNTER — Other Ambulatory Visit: Payer: Self-pay

## 2018-08-20 ENCOUNTER — Ambulatory Visit: Payer: Medicaid Other | Admitting: Orthopaedic Surgery

## 2018-08-20 DIAGNOSIS — G8929 Other chronic pain: Secondary | ICD-10-CM

## 2018-08-20 DIAGNOSIS — M25511 Pain in right shoulder: Secondary | ICD-10-CM

## 2018-08-20 DIAGNOSIS — Z6841 Body Mass Index (BMI) 40.0 and over, adult: Secondary | ICD-10-CM

## 2018-08-20 NOTE — Progress Notes (Signed)
Virtual Visit via Telephone Note  I connected with David Brooks on 08/20/18 at 10:10 AM EDT by telephone and verified that I am speaking with the correct person using two identifiers.  Location: Patient: at home Provider: at home   I discussed the limitations, risks, security and privacy concerns of performing an evaluation and management service by telephone and the availability of in person appointments. I also discussed with the patient that there may be a patient responsible charge related to this service. The patient expressed understanding and agreed to proceed.   History of Present Illness: His right shoulder is a little better.  He has some more motion but only 50% he says.  His intense pain is not there but he still has pain and limited motion.  Dr. Sudie Bailey got him his pain medicine.  He needs MRI of the shoulder on the right.  It is scheduled for May 28th.  I will see him the following week.  He is to continue his exorcizes and medicine.   Observations/Objective: Per above  Assessment and Plan: Encounter Diagnoses  Name Primary?  . Chronic right shoulder pain Yes  . Body mass index 45.0-49.9, adult (HCC)   . Morbid obesity (HCC)      Follow Up Instructions: MRI 09-11-2018, see in office after that. Call if any problem.  Precautions discussed.      I discussed the assessment and treatment plan with the patient. The patient was provided an opportunity to ask questions and all were answered. The patient agreed with the plan and demonstrated an understanding of the instructions.   The patient was advised to call back or seek an in-person evaluation if the symptoms worsen or if the condition fails to improve as anticipated.  I provided 8 minutes of non-face-to-face time during this encounter.   Darreld Mclean, MD

## 2018-08-21 ENCOUNTER — Ambulatory Visit: Payer: Medicaid Other | Admitting: Orthopaedic Surgery

## 2018-09-01 ENCOUNTER — Telehealth: Payer: Self-pay | Admitting: Orthopaedic Surgery

## 2018-09-01 DIAGNOSIS — G8929 Other chronic pain: Secondary | ICD-10-CM

## 2018-09-01 DIAGNOSIS — M25511 Pain in right shoulder: Secondary | ICD-10-CM

## 2018-09-01 NOTE — Telephone Encounter (Signed)
Patient called to relay that he received a letter from [facility] indicating that his MRI has been denied.  (said not sure what date was shown on letter - did not have in front of him).  States he has already been scheduled for the MRI, for 09/10/18, [facility].  Please advise. Ph [phone]

## 2018-09-03 NOTE — Telephone Encounter (Signed)
MRI is not approved at this time. Left VM for patient with details and call back information.

## 2018-09-10 ENCOUNTER — Other Ambulatory Visit: Payer: Medicaid Other

## 2018-09-16 ENCOUNTER — Other Ambulatory Visit: Payer: Self-pay

## 2018-09-16 ENCOUNTER — Ambulatory Visit: Payer: Medicaid Other | Admitting: Orthopaedic Surgery

## 2018-09-16 ENCOUNTER — Encounter: Payer: Self-pay | Admitting: Orthopaedic Surgery

## 2018-09-16 VITALS — BP 145/95 | HR 90 | Temp 98.1°F | Ht 71.0 in | Wt 338.0 lb

## 2018-09-16 DIAGNOSIS — Z6841 Body Mass Index (BMI) 40.0 and over, adult: Secondary | ICD-10-CM | POA: Diagnosis not present

## 2018-09-16 DIAGNOSIS — M25511 Pain in right shoulder: Secondary | ICD-10-CM

## 2018-09-16 DIAGNOSIS — G8929 Other chronic pain: Secondary | ICD-10-CM | POA: Diagnosis not present

## 2018-09-16 NOTE — Progress Notes (Signed)
Patient ZO:XWRUEAV:David Brooks, male DOB:[birth year], 53 y.o. WUJ:[id number]  Chief Complaint  Patient presents with  . Shoulder Pain    Right    HPI  David Brooks is a 53 y.o. male who continues to have pain of the right shoulder.  He was scheduled for the MRI of the right shoulder but Medicaid did not approve it for some reason.  So, to further document his problem for Medicaid:  The patient has had increasing pain of the right shoulder over the last six months with decrease in motion.  He has limited motion of only 90 forward and 75 abduction.  He has popping and swelling of the right shoulder.  He has tried rest, ice, heat and rubs as well as ibuprofen and pain medicine.  Dr. Sudie BaileyKnowlton gives him the pain medicine.  He cannot sleep in a bed because the pain is so bad when he rolls over in bed.  He sleeps in a chair at night.  He is getting worse.  He has no numbness.  I feel he has a rotator cuff tear on the right.  He has had diagnosis of rotator cuff tear on the left which he says the right feels like that but worse.  I feel he needs MRI to document rotator cuff tear.  He may need surgery to repair this if it is torn, which I feel it is.  He has not improved with conservative treatment.  I have given him exercises to do in the past but he has had no improvement at all and is even worse now.   Body mass index is 47.14 kg/m.  ROS  Review of Systems  All other systems reviewed and are negative.  The following is a summary of the past history medically, past history surgically, known current medicines, social history and family history.  This information is gathered electronically by the computer from prior information and documentation.  I review this each visit and have found including this information at this point in the chart is beneficial and informative.    Past Medical History:  Diagnosis Date  . Diabetes mellitus without complication (HCC)   . Hypertension   . Rotator cuff  (capsule) sprain     History reviewed. No pertinent surgical history.  Family History  Problem Relation Age of Onset  . Hypertension Mother   . Hyperlipidemia Mother   . Cancer Father        throat    Social History Social History   Tobacco Use  . Smoking status: Never Smoker  . Smokeless tobacco: Never Used  Substance Use Topics  . Alcohol use: No  . Drug use: No    Allergies  Allergen Reactions  . Penicillins Anaphylaxis  . Shrimp [Shellfish Allergy]     anaphylaxis  . Tramadol Rash    Possible combination with Zantac    Current Outpatient Medications  Medication Sig Dispense Refill  . amLODipine (NORVASC) 10 MG tablet Take 10 mg by mouth daily.    Marland Kitchen. aspirin EC 81 MG tablet Take 81 mg by mouth daily.    Marland Kitchen. atenolol (TENORMIN) 50 MG tablet Take 50 mg by mouth daily. for high blood pressure  3  . DULoxetine (CYMBALTA) 60 MG capsule TAKE 1 CAPSULE BY MOUTH ONCE DAILY FOR 1 2 WEEKS. THEN INCREASE THE DOSE TO ONE CAPSULE TWICE DAILY FOR CHRONIC PAIN  11  . glimepiride (AMARYL) 1 MG tablet Take 1 tablet by mouth daily.    . hydrochlorothiazide (  HYDRODIURIL) 25 MG tablet Take 25 mg by mouth daily. for high blood pressure    . HYDROcodone-acetaminophen (NORCO) 10-325 MG tablet Take 1 tablet by mouth every 6 (six) hours as needed.    Marland Kitchen ibuprofen (ADVIL,MOTRIN) 800 MG tablet Take 800 mg by mouth every 6 (six) hours as needed for moderate pain.    Marland Kitchen losartan-hydrochlorothiazide (HYZAAR) 100-25 MG tablet Take 1 tablet by mouth daily. for high blood pressure  3  . lovastatin (MEVACOR) 10 MG tablet Take 1 tablet by mouth daily.    Marland Kitchen lovastatin (MEVACOR) 20 MG tablet Take 20 mg by mouth at bedtime.    . metFORMIN (GLUCOPHAGE-XR) 500 MG 24 hr tablet TAKE 2 TABLETS BY MOUTH TWICE DAILY FOR DIABETES  3   Current Facility-Administered Medications  Medication Dose Route Frequency Provider Last Rate Last Dose  . triamcinolone acetonide (KENALOG) 10 MG/ML injection 10 mg  10 mg Other  Once Vivi Barrack, DPM         Physical Exam  Blood pressure (!) 145/95, pulse 90, temperature 98.1 F (36.7 C), height 5\' 11"  (1.803 m), weight (!) 338 lb (153.3 kg).  Constitutional: overall normal hygiene, normal nutrition, well developed, normal grooming, normal body habitus. Assistive device:none  Musculoskeletal: gait and station Limp none, muscle tone and strength are normal, no tremors or atrophy is present.  .  Neurological: coordination overall normal.  Deep tendon reflex/nerve stretch intact.  Sensation normal.  Cranial nerves II-XII intact.   Skin:   Normal overall no scars, lesions, ulcers or rashes. No psoriasis.  Psychiatric: Alert and oriented x 3.  Recent memory intact, remote memory unclear.  Normal mood and affect. Well groomed.  Good eye contact.  Cardiovascular: overall no swelling, no varicosities, no edema bilaterally, normal temperatures of the legs and arms, no clubbing, cyanosis and good capillary refill.  Lymphatic: palpation is normal.  Examination of right Upper Extremity is done.  Inspection:   Overall:  Elbow non-tender without crepitus or defects, forearm non-tender without crepitus or defects, wrist non-tender without crepitus or defects, hand non-tender.    Shoulder: with glenohumeral joint tenderness, without effusion.   Upper arm:  without swelling and tenderness   Range of motion:   Overall:  Full range of motion of the elbow, full range of motion of wrist and full range of motion in fingers.   Shoulder:  right  90 degrees forward flexion; 75 degrees abduction; 20 degrees internal rotation, 15 degrees external rotation, 5 degrees extension, 25 degrees adduction.   Stability:   Overall:  Shoulder, elbow and wrist stable   Strength and Tone:   Overall full shoulder muscles strength, full upper arm strength and normal upper arm bulk and tone.  All other systems reviewed and are negative   The patient has been educated about the nature  of the problem(s) and counseled on treatment options.  The patient appeared to understand what I have discussed and is in agreement with it.  Encounter Diagnoses  Name Primary?  . Chronic right shoulder pain Yes  . Body mass index 45.0-49.9, adult (HCC)   . Morbid obesity (HCC)     PLAN Call if any problems.  Precautions discussed.  Continue current medications.   Return to clinic MRI of the right shoulder   Electronically Signed Darreld Mclean, MD 6/2/20208:22 AM

## 2018-09-23 ENCOUNTER — Telehealth: Payer: Self-pay | Admitting: Orthopaedic Surgery

## 2018-09-23 NOTE — Telephone Encounter (Signed)
Spoke to pt and let him know that I have been contacting [employer] to get answers on his denial. I have faxed additional information to try to get approval.

## 2018-09-23 NOTE — Telephone Encounter (Signed)
Mr. David Brooks called this morning asking about his MRI.  He said originally it was denied by Medicaid.  He wants to know what is happening now.  Did we go back and try to get further approval or what/  He has questions that I can't answer.  Would you call him please when you have time?  Thanks

## 2018-09-24 NOTE — Telephone Encounter (Signed)
Reordering MRI at this time and additional notes faxed for review.

## 2018-10-16 ENCOUNTER — Ambulatory Visit
Admission: RE | Admit: 2018-10-16 | Discharge: 2018-10-16 | Disposition: A | Discharge status: Home / Self Care | Payer: Medicaid Other | Source: Ambulatory Visit | Attending: Orthopaedic Surgery | Admitting: Orthopaedic Surgery

## 2018-10-16 DIAGNOSIS — G8929 Other chronic pain: Secondary | ICD-10-CM

## 2018-10-22 ENCOUNTER — Other Ambulatory Visit: Payer: Self-pay

## 2018-10-22 ENCOUNTER — Encounter: Payer: Self-pay | Admitting: Orthopaedic Surgery

## 2018-10-22 ENCOUNTER — Ambulatory Visit (INDEPENDENT_AMBULATORY_CARE_PROVIDER_SITE_OTHER): Payer: Medicaid Other | Admitting: Orthopaedic Surgery

## 2018-10-22 DIAGNOSIS — Z6841 Body Mass Index (BMI) 40.0 and over, adult: Secondary | ICD-10-CM

## 2018-10-22 DIAGNOSIS — M25511 Pain in right shoulder: Secondary | ICD-10-CM

## 2018-10-22 DIAGNOSIS — G8929 Other chronic pain: Secondary | ICD-10-CM

## 2018-10-22 NOTE — Progress Notes (Signed)
Virtual Visit via Telephone Note  I connected with@ on 10/22/18 at 10:00 AM EDT by telephone and verified that I am speaking with the correct person using two identifiers.  Location: Patient: home Provider: [facility]   I discussed the limitations, risks, security and privacy concerns of performing an evaluation and management service by telephone and the availability of in person appointments. I also discussed with the patient that there may be a patient responsible charge related to this service. The patient expressed understanding and agreed to proceed.   History of Present Illness: He had the MRI of the right shoulder showing a complete retracted rotator cuff tear.  I have informed him of the findings.  I have recommended he see Dr. Aline Brochure for possible surgery.  He wants to wait and see about his work schedule and what work will do about the COVID-19 problem.  He says he may call and get another injection by me until he can work something out.  He has no new trauma.   Observations/Objective: Per above.  Assessment and Plan: Encounter Diagnoses  Name Primary?  . Chronic right shoulder pain Yes  . Body mass index 45.0-49.9, adult ([place])   . Morbid obesity ([place])      Follow Up Instructions: To see Dr. Aline Brochure.   I discussed the assessment and treatment plan with the patient. The patient was provided an opportunity to ask questions and all were answered. The patient agreed with the plan and demonstrated an understanding of the instructions.   The patient was advised to call back or seek an in-person evaluation if the symptoms worsen or if the condition fails to improve as anticipated.  I provided 8 minutes of non-face-to-face time during this encounter.   Sanjuana Kava, MD

## 2018-10-23 ENCOUNTER — Telehealth: Payer: Self-pay | Admitting: Orthopaedic Surgery

## 2018-10-23 NOTE — Telephone Encounter (Signed)
Dr. Luna Glasgow would like for you to consult on this patient of his for possible shoulder surgery.  Would you review your schedule and advise where the best appointment date and time would be?  Thanks

## 2018-10-24 NOTE — Telephone Encounter (Signed)
Any time next week in a new patient slot

## 2018-10-29 ENCOUNTER — Other Ambulatory Visit: Payer: Self-pay

## 2018-10-29 ENCOUNTER — Ambulatory Visit: Payer: Medicaid Other | Admitting: Orthopedic Surgery

## 2018-10-29 VITALS — BP 169/98 | HR 90 | Temp 97.0°F | Ht 71.0 in | Wt 338.0 lb

## 2018-10-29 DIAGNOSIS — S46011A Strain of muscle(s) and tendon(s) of the rotator cuff of right shoulder, initial encounter: Secondary | ICD-10-CM

## 2018-10-29 DIAGNOSIS — Z6841 Body Mass Index (BMI) 40.0 and over, adult: Secondary | ICD-10-CM

## 2018-10-29 NOTE — Progress Notes (Addendum)
David Brooks  10/29/2018  HISTORY SECTION :  Chief Complaint  Patient presents with  . Shoulder Pain    Rt shoulder    HPI The patient presents for evaluation of right shoulder for possible surgery.  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  Since that time is had injections in the shoulder which gave him some pain relief but once the injections wore off this pain increased in his ability to abduct and flex his arm have been compromised.  Pain Location right shoulder Duration timing as indicated Quality dull ache Severity 10 at times Associated with weakness loss of motion  Review of Systems  Constitutional: Negative for chills and fever.  Musculoskeletal: Positive for joint pain.  Neurological: Negative for tingling.  All other systems reviewed and are negative.    Past Medical History:  Diagnosis Date  . Diabetes mellitus without complication ([place])   . Hypertension   . Rotator cuff (capsule) sprain     No past surgical history on file.   Allergies  Allergen Reactions  . Penicillins Anaphylaxis  . Shrimp [Shellfish Allergy]     anaphylaxis  . Tramadol Rash    Possible combination with Zantac     Current Outpatient Medications:  .  amLODipine (NORVASC) 10 MG tablet, Take 10 mg by mouth daily., Disp: , Rfl:  .  aspirin EC 81 MG tablet, Take 81 mg by mouth daily., Disp: , Rfl:  .  atenolol (TENORMIN) 50 MG tablet, Take 50 mg by mouth daily. for high blood pressure, Disp: , Rfl: 3 .  DULoxetine (CYMBALTA) 60 MG capsule, TAKE 1 CAPSULE BY MOUTH ONCE DAILY FOR 1 2 WEEKS. THEN INCREASE THE DOSE TO ONE CAPSULE TWICE DAILY FOR CHRONIC PAIN, Disp: , Rfl: 11 .  glimepiride (AMARYL) 1 MG  tablet, Take 1 tablet by mouth daily., Disp: , Rfl:  .  hydrochlorothiazide (HYDRODIURIL) 25 MG tablet, Take 25 mg by mouth daily. for high blood pressure, Disp: , Rfl:  .  ibuprofen (ADVIL,MOTRIN) 800 MG tablet, Take 800 mg by mouth every 6 (six) hours as needed for moderate pain., Disp: , Rfl:  .  losartan-hydrochlorothiazide (HYZAAR) 100-25 MG tablet, Take 1 tablet by mouth daily. for high blood pressure, Disp: , Rfl: 3 .  lovastatin (MEVACOR) 10 MG tablet, Take 1 tablet by mouth daily., Disp: , Rfl:  .  lovastatin (MEVACOR) 20 MG tablet, Take 20 mg by mouth at bedtime., Disp: , Rfl:  .  metFORMIN (GLUCOPHAGE-XR) 500 MG 24 hr tablet, TAKE 2 TABLETS BY MOUTH TWICE DAILY FOR DIABETES, Disp: , Rfl: 3 .  oxyCODONE-acetaminophen (PERCOCET) 10-325 MG tablet, TAKE 1 TABLET BY MOUTH UP TO FIVE TIMES DAILY FOR SEVERE PAIN, Disp: , Rfl:   Current Facility-Administered Medications:  .  triamcinolone acetonide (KENALOG) 10 MG/ML injection 10 mg, 10 mg, Other, Once, Jacqualyn Posey, Bonna Gains, DPM   PHYSICAL EXAM SECTION: 1) BP (!) 169/98   Pulse 90   Ht 5\' 11"  (1.803 m)   Wt (!) 338 lb (153.3 kg)   BMI 47.14 kg/m   Body mass index is 47.14 kg/m. General appearance: Well-developed well-nourished no gross deformities  2) Cardiovascular normal pulse  and perfusion in all 4 extremities normal color without edema  3) Neurologically deep tendon reflexes are equal and normal, no sensation loss or deficits no pathologic reflexes  4) Psychological: Awake alert and oriented x3 mood and affect normal  5) Skin no lacerations or ulcerations no nodularity no palpable masses, no erythema or nodularity  6) Musculoskeletal:   Right shoulder: Abduction in the office active 90 degrees flexion 70 degrees passive 115 degrees with pain  No weakness in internal and external rotation but weakness in flexion abduction  Stable abduction external rotation  Tenderness rotator interval anterior deltoid  Lymph nodes  negative    MEDICAL DECISION SECTION:  Encounter Diagnosis  Name Primary?  . Traumatic complete tear of right rotator cuff, initial encounter Yes  The patient meets the AMA guidelines for Morbid (severe) obesity with a BMI > 40.0 and I have recommended weight loss.   Imaging   Plain films were normal MRI shows a large rotator cuff tear   Plan:  (Rx., Inj., surg., Frx, MRI/CT, XR:2)  He has hypertension diabetes he will need his blood pressure well controlled he sees his doctor once a month and we need to make sure that good for him to have surgery  He is on Medicaid and be out of work for a year and this is financially potentially a problem for him.  CLINICAL DATA:  Five year history of right shoulder pain. Weight lifter.   EXAM: MRI OF THE RIGHT SHOULDER WITHOUT CONTRAST   TECHNIQUE: Multiplanar, multisequence MR imaging of the shoulder was performed. No intravenous contrast was administered.   COMPARISON:  Radiographs 08/13/2018   FINDINGS: Rotator cuff: Large full-thickness retracted rotator cuff tear. This involves the entire supraspinatus and infraspinatus tendons. The supraspinatus tendon is retracted approximately 2.3 cm and the infraspinatus tendon 2.2 cm. The upper fibers of the subscapularis tendon are also torn and there is significant tendinopathy involving the mid and lower fibers of the subscapularis.   Muscles: Mild fatty atrophy of the infraspinatus and subscapularis muscles. No obvious fatty atrophy of the supraspinatus muscle.   Biceps long head: Intact. Significant tendinopathy involving the intra-articular portion.   Acromioclavicular Joint: Moderate AC joint degenerative changes. Type 1-2 acromion. Minimal lateral downsloping.   Glenohumeral Joint: No degenerative changes. Small joint effusion. Mild synovitis.   Labrum:  No obvious labral tears.   Bones:  No significant bony findings.   Other: Expected fluid in the  subacromial/subdeltoid bursa.   IMPRESSION: 1. Large full-thickness retracted rotator cuff tear as detailed above. 2. Significant tendinopathy involving the intra-articular portion of the long head biceps tendon. No tear/rupture. 3. Intact glenoid labrum. 4. No significant findings for bony impingement.     Electronically Signed   By: Rudie MeyerP.  Gallerani M.D.   On: 10/17/2018 11:46   Plan open rotator cuff with vertical incision possible graft to right shoulder  4:24 PM

## 2018-11-07 ENCOUNTER — Telehealth: Payer: Self-pay

## 2018-11-07 NOTE — Telephone Encounter (Signed)
Thanks I have advised him. He has voiced understanding

## 2018-11-07 NOTE — Telephone Encounter (Signed)
Patient called stating he has some questions about shoulder surgery. Things like how long surgery will be and other questions.   Please call and advise

## 2018-11-07 NOTE — Telephone Encounter (Signed)
Plan open rotator cuff with vertical incision possible graft to right shoulder/ he wanted to wait to see if he got 2nd stimulus check prior to scheduling.   I will call him to discuss.

## 2018-11-07 NOTE — Telephone Encounter (Signed)
Dermal (skin graft, from cadaver)

## 2018-11-07 NOTE — Telephone Encounter (Signed)
He is not ready to schedule yet He has to give 30 day notice at work He wants to know what type of graft you may have to use, I told him I will call him back

## 2018-12-25 ENCOUNTER — Encounter: Payer: Self-pay | Admitting: Orthopaedic Surgery

## 2018-12-25 ENCOUNTER — Other Ambulatory Visit: Payer: Self-pay

## 2018-12-25 ENCOUNTER — Ambulatory Visit: Payer: Medicaid Other | Admitting: Orthopaedic Surgery

## 2018-12-25 VITALS — Wt 328.0 lb

## 2018-12-25 DIAGNOSIS — M25511 Pain in right shoulder: Secondary | ICD-10-CM | POA: Diagnosis not present

## 2018-12-25 DIAGNOSIS — G8929 Other chronic pain: Secondary | ICD-10-CM | POA: Diagnosis not present

## 2018-12-25 DIAGNOSIS — Z6841 Body Mass Index (BMI) 40.0 and over, adult: Secondary | ICD-10-CM

## 2018-12-25 NOTE — Progress Notes (Signed)
PROCEDURE NOTE:  The patient request injection, verbal consent was obtained.  The right shoulder was prepped appropriately after time out was performed.   Sterile technique was observed and injection of 1 cc of Depo-Medrol 40 mg with several cc's of plain xylocaine. Anesthesia was provided by ethyl chloride and a 20-gauge needle was used to inject the shoulder area. A posterior approach was used.  The injection was tolerated well.  A band aid dressing was applied.  The patient was advised to apply ice later today and tomorrow to the injection sight as needed.  Electronically Signed Sanjuana Kava, MD 9/10/202011:10 AM

## 2019-02-05 ENCOUNTER — Other Ambulatory Visit: Payer: Self-pay

## 2019-02-05 ENCOUNTER — Ambulatory Visit: Payer: Medicaid Other

## 2019-02-05 ENCOUNTER — Encounter: Payer: Self-pay | Admitting: Orthopaedic Surgery

## 2019-02-05 ENCOUNTER — Ambulatory Visit (INDEPENDENT_AMBULATORY_CARE_PROVIDER_SITE_OTHER): Payer: Medicaid Other | Admitting: Orthopaedic Surgery

## 2019-02-05 VITALS — BP 154/98 | HR 82 | Temp 96.9°F | Ht 71.0 in | Wt 333.0 lb

## 2019-02-05 DIAGNOSIS — M25511 Pain in right shoulder: Secondary | ICD-10-CM

## 2019-02-05 DIAGNOSIS — G8929 Other chronic pain: Secondary | ICD-10-CM | POA: Diagnosis not present

## 2019-02-05 DIAGNOSIS — Z6841 Body Mass Index (BMI) 40.0 and over, adult: Secondary | ICD-10-CM
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
## 2019-03-17 ENCOUNTER — Encounter: Payer: Self-pay | Admitting: Orthopaedic Surgery

## 2019-03-17 ENCOUNTER — Ambulatory Visit: Payer: Medicaid Other | Admitting: Orthopaedic Surgery

## 2019-03-19 ENCOUNTER — Other Ambulatory Visit: Payer: Self-pay

## 2019-03-19 ENCOUNTER — Ambulatory Visit: Payer: Medicaid Other | Admitting: Orthopaedic Surgery

## 2019-03-19 ENCOUNTER — Encounter: Payer: Self-pay | Admitting: Orthopaedic Surgery

## 2019-03-19 VITALS — Temp 97.0°F

## 2019-03-19 DIAGNOSIS — Z6841 Body Mass Index (BMI) 40.0 and over, adult: Secondary | ICD-10-CM

## 2019-03-19 DIAGNOSIS — M25511 Pain in right shoulder: Secondary | ICD-10-CM | POA: Diagnosis not present

## 2019-03-19 DIAGNOSIS — M25512 Pain in left shoulder: Secondary | ICD-10-CM | POA: Diagnosis not present

## 2019-03-19 DIAGNOSIS — G8929 Other chronic pain: Secondary | ICD-10-CM | POA: Diagnosis not present

## 2019-03-19 NOTE — Progress Notes (Signed)
PROCEDURE NOTE:  The patient request injection, verbal consent was obtained.  The left shoulder was prepped appropriately after time out was performed.   Sterile technique was observed and injection of 1 cc of Depo-Medrol 40 mg with several cc's of plain xylocaine. Anesthesia was provided by ethyl chloride and a 20-gauge needle was used to inject the shoulder area. A posterior approach was used.  The injection was tolerated well.  A band aid dressing was applied.  The patient was advised to apply ice later today and tomorrow to the injection sight as needed.   PROCEDURE NOTE:  The patient request injection, verbal consent was obtained.  The right shoulder was prepped appropriately after time out was performed.   Sterile technique was observed and injection of 1 cc of Depo-Medrol 40 mg with several cc's of plain xylocaine. Anesthesia was provided by ethyl chloride and a 20-gauge needle was used to inject the shoulder area. A posterior approach was used.  The injection was tolerated well.  A band aid dressing was applied.  The patient was advised to apply ice later today and tomorrow to the injection sight as needed.  See as needed.  Electronically Signed Sanjuana Kava, MD 12/3/20201:44 PM

## 2019-08-13 ENCOUNTER — Ambulatory Visit: Payer: Medicaid Other | Admitting: Orthopaedic Surgery

## 2019-08-20 ENCOUNTER — Ambulatory Visit: Payer: Medicaid Other | Admitting: Orthopaedic Surgery

## 2019-08-25 ENCOUNTER — Encounter: Payer: Self-pay | Admitting: Orthopaedic Surgery

## 2019-08-25 ENCOUNTER — Ambulatory Visit: Payer: Medicaid Other | Admitting: Orthopaedic Surgery

## 2019-08-25 ENCOUNTER — Other Ambulatory Visit: Payer: Self-pay

## 2019-08-25 VITALS — Temp 97.0°F

## 2019-08-25 DIAGNOSIS — G8929 Other chronic pain: Secondary | ICD-10-CM

## 2019-08-25 DIAGNOSIS — M25512 Pain in left shoulder: Secondary | ICD-10-CM | POA: Diagnosis not present

## 2019-08-25 DIAGNOSIS — Z6841 Body Mass Index (BMI) 40.0 and over, adult: Secondary | ICD-10-CM

## 2019-08-25 DIAGNOSIS — M25511 Pain in right shoulder: Secondary | ICD-10-CM | POA: Diagnosis not present

## 2019-08-25 NOTE — Progress Notes (Signed)
PROCEDURE NOTE:  The patient request injection, verbal consent was obtained.  The left shoulder was prepped appropriately after time out was performed.   Sterile technique was observed and injection of 1 cc of Depo-Medrol 40 mg with several cc's of plain xylocaine. Anesthesia was provided by ethyl chloride and a 20-gauge needle was used to inject the shoulder area. A posterior approach was used.  The injection was tolerated well.  A band aid dressing was applied.  The patient was advised to apply ice later today and tomorrow to the injection sight as needed.   PROCEDURE NOTE:  The patient request injection, verbal consent was obtained.  The right shoulder was prepped appropriately after time out was performed.   Sterile technique was observed and injection of 1 cc of Depo-Medrol 40 mg with several cc's of plain xylocaine. Anesthesia was provided by ethyl chloride and a 20-gauge needle was used to inject the shoulder area. A posterior approach was used.  The injection was tolerated well.  A band aid dressing was applied.  The patient was advised to apply ice later today and tomorrow to the injection sight as needed.   See as needed.  Call if any problem.  Precautions discussed.   Electronically Signed Darreld Mclean, MD 5/11/20212:50 PM

## 2019-12-10 ENCOUNTER — Telehealth: Payer: Self-pay | Admitting: Orthopaedic Surgery

## 2019-12-10 NOTE — Telephone Encounter (Signed)
-----   Message -----  From: Lovena Le  Sent: 12/09/2019  9:10 PM EDT  To: [facility] Support Pool  Subject: Appointment Request                 Appointment Request From: Lovena Le    With Provider: Darreld Mclean, MD [facility]]    Preferred Date Range: 12/31/2019 - 01/01/2020    Preferred Times: Monday Afternoon, Tuesday Afternoon    Reason for visit: Office Visit    Comments:  Rotator cuff right side     Called patient left message to call the office back and schedule appt

## 2019-12-15 ENCOUNTER — Ambulatory Visit (INDEPENDENT_AMBULATORY_CARE_PROVIDER_SITE_OTHER): Payer: Medicaid Other

## 2019-12-15 ENCOUNTER — Encounter: Payer: Self-pay | Admitting: Podiatry

## 2019-12-15 ENCOUNTER — Other Ambulatory Visit: Payer: Self-pay

## 2019-12-15 ENCOUNTER — Ambulatory Visit (INDEPENDENT_AMBULATORY_CARE_PROVIDER_SITE_OTHER): Payer: Medicaid Other | Admitting: Podiatry

## 2019-12-15 ENCOUNTER — Other Ambulatory Visit: Payer: Self-pay | Admitting: Podiatry

## 2019-12-15 DIAGNOSIS — M7752 Other enthesopathy of left foot: Secondary | ICD-10-CM | POA: Diagnosis not present

## 2019-12-15 DIAGNOSIS — B351 Tinea unguium: Secondary | ICD-10-CM | POA: Diagnosis not present

## 2019-12-15 DIAGNOSIS — M779 Enthesopathy, unspecified: Secondary | ICD-10-CM

## 2019-12-15 DIAGNOSIS — M7751 Other enthesopathy of right foot: Secondary | ICD-10-CM

## 2019-12-15 DIAGNOSIS — I10 Essential (primary) hypertension: Secondary | ICD-10-CM | POA: Insufficient documentation

## 2019-12-15 MED ORDER — DICLOFENAC SODIUM 1 % EX GEL: 2.0000 g | Freq: Four times a day (QID) | CUTANEOUS | 2 refills | Status: DC

## 2019-12-16 NOTE — Progress Notes (Signed)
Subjective:   Patient ID: David Brooks, male   DOB: 54 y.o.   MRN: [id number]   HPI 54 year old male presents the office today for concerns of his right great toe ingrowing thickened discolored.  Patient becomes tender but denies any drainage or pus or any swelling or redness.  Said no recent treatment for this.  Also is having some discomfort of his left foot.  He points to the dorsal lateral aspect of the foot with some joint discomfort this is been ongoing last 6 months.  No injury.  No significant swelling.  He gets soreness and throbbing to the area.   Review of Systems  All other systems reviewed and are negative.  Past Medical History:  Diagnosis Date  . Diabetes mellitus without complication ([facility])   . Hypertension   . Rotator cuff (capsule) sprain     History reviewed. No pertinent surgical history.   Current Outpatient Medications:  .  ALPRAZolam (XANAX) 1 MG tablet, Take by mouth., Disp: , Rfl:  .  amLODipine (NORVASC) 10 MG tablet, Take 10 mg by mouth daily., Disp: , Rfl:  .  aspirin EC 81 MG tablet, Take 81 mg by mouth daily., Disp: , Rfl:  .  atenolol (TENORMIN) 50 MG tablet, Take 50 mg by mouth daily. for high blood pressure, Disp: , Rfl: 3 .  diclofenac Sodium (VOLTAREN) 1 % GEL, Apply 2 g topically 4 (four) times daily. Rub into affected area of foot 2 to 4 times daily, Disp: 100 g, Rfl: 2 .  DULoxetine (CYMBALTA) 60 MG capsule, TAKE 1 CAPSULE BY MOUTH ONCE DAILY FOR 1 2 WEEKS. THEN INCREASE THE DOSE TO ONE CAPSULE TWICE DAILY FOR CHRONIC PAIN, Disp: , Rfl: 11 .  glimepiride (AMARYL) 1 MG tablet, Take 1 tablet by mouth daily., Disp: , Rfl:  .  glyBURIDE (DIABETA) 5 MG tablet, Take 5 mg by mouth 2 (two) times daily., Disp: , Rfl:  .  hydrochlorothiazide (HYDRODIURIL) 25 MG tablet, Take 25 mg by mouth daily. for high blood pressure, Disp: , Rfl:  .  ibuprofen (ADVIL,MOTRIN) 800 MG tablet, Take 800 mg by mouth every 6 (six) hours as needed for moderate pain., Disp: ,  Rfl:  .  losartan (COZAAR) 100 MG tablet, Take 100 mg by mouth daily., Disp: , Rfl:  .  losartan-hydrochlorothiazide (HYZAAR) 100-25 MG tablet, Take 1 tablet by mouth daily. for high blood pressure, Disp: , Rfl: 3 .  lovastatin (MEVACOR) 10 MG tablet, Take 1 tablet by mouth daily., Disp: , Rfl:  .  lovastatin (MEVACOR) 20 MG tablet, Take 20 mg by mouth at bedtime., Disp: , Rfl:  .  metFORMIN (GLUCOPHAGE-XR) 500 MG 24 hr tablet, TAKE 2 TABLETS BY MOUTH TWICE DAILY FOR DIABETES, Disp: , Rfl: 3 .  oxyCODONE-acetaminophen (PERCOCET) 10-325 MG tablet, TAKE 1 TABLET BY MOUTH UP TO FIVE TIMES DAILY FOR SEVERE PAIN, Disp: , Rfl:  .  pantoprazole (PROTONIX) 40 MG tablet, Take 40 mg by mouth daily., Disp: , Rfl:  .  sildenafil (REVATIO) 20 MG tablet, SMARTSIG:0-5 Tablet(s) By Mouth, Disp: , Rfl:   Current Facility-Administered Medications:  .  triamcinolone acetonide (KENALOG) 10 MG/ML injection 10 mg, 10 mg, Other, Once, Vivi Barrack, DPM  Allergies  Allergen Reactions  . Penicillins Anaphylaxis  . Shrimp [Shellfish Allergy]     anaphylaxis  . Tramadol Rash    Possible combination with Zantac          Objective:  Physical Exam  General: AAO  x3, NAD  Dermatological: The right hallux toenails hypertrophic, dystrophic with brown and dark discoloration.  There is no hyperpigmentation to the surrounding skin or underlying nail bed upon debridement.  There is no edema, erythema or signs of infection.  There is no open lesions.  Vascular: Dorsalis Pedis artery and Posterior Tibial artery pedal pulses are 2/4 bilateral with immedate capillary fill time. There is no pain with calf compression, swelling, warmth, erythema.   Neruologic: Grossly intact via light touch bilateral.   Musculoskeletal: Tenderness on the dorsal lateral aspect of the midfoot on the fourth, fifth metatarsal cuboid joint.  No specific area of tenderness there is no edema, erythema.  Flexor, extensor tendons appear to be  intact.  Muscular strength 5/5 in all groups tested bilateral.  Gait: Unassisted, Nonantalgic.       Assessment:   54 year old male right foot hallux onychomycosis;  left foot tendinitis     Plan:  -Treatment options discussed including all alternatives, risks, and complications -Etiology of symptoms were discussed -X-rays obtained reviewed left foot.  No evidence of acute fracture or stress fracture. -I debrided the right hallux nail as this for culture, pathology.  Discussed treatment options for nail fungus but await results of culture. -Discussed steroid injection for the left foot.  He was to hold off on this as he is upcoming injection and Covid vaccine.  Continue Voltaren gel as well as supportive shoes, inserts.    Vivi Barrack DPM

## 2019-12-28 ENCOUNTER — Telehealth: Payer: Self-pay | Admitting: Podiatry

## 2019-12-28 NOTE — Telephone Encounter (Signed)
Patient called to see results of fungal testing, I let him know the results are not in yet and could take up to 4 weeks.  Sharl Ma, DPM 12/28/2019

## 2019-12-29 ENCOUNTER — Ambulatory Visit: Payer: Self-pay | Admitting: Orthopaedic Surgery

## 2020-01-05 ENCOUNTER — Ambulatory Visit (INDEPENDENT_AMBULATORY_CARE_PROVIDER_SITE_OTHER): Payer: Medicaid Other | Admitting: Orthopaedic Surgery

## 2020-01-05 ENCOUNTER — Encounter: Payer: Self-pay | Admitting: Orthopaedic Surgery

## 2020-01-05 ENCOUNTER — Other Ambulatory Visit: Payer: Self-pay

## 2020-01-05 VITALS — Ht 71.0 in | Wt 320.0 lb

## 2020-01-05 DIAGNOSIS — Z6841 Body Mass Index (BMI) 40.0 and over, adult: Secondary | ICD-10-CM

## 2020-01-05 DIAGNOSIS — M25511 Pain in right shoulder: Secondary | ICD-10-CM | POA: Diagnosis not present

## 2020-01-05 DIAGNOSIS — G8929 Other chronic pain: Secondary | ICD-10-CM | POA: Diagnosis not present

## 2020-01-05 DIAGNOSIS — M25512 Pain in left shoulder: Secondary | ICD-10-CM | POA: Diagnosis not present

## 2020-01-05 NOTE — Progress Notes (Signed)
PROCEDURE NOTE:  The patient request injection, verbal consent was obtained.  The left shoulder was prepped appropriately after time out was performed.   Sterile technique was observed and injection of 1 cc of Depo-Medrol 40 mg with several cc's of plain xylocaine. Anesthesia was provided by ethyl chloride and a 20-gauge needle was used to inject the shoulder area. A posterior approach was used.  The injection was tolerated well.  A band aid dressing was applied.  The patient was advised to apply ice later today and tomorrow to the injection sight as needed.  PROCEDURE NOTE:  The patient request injection, verbal consent was obtained.  The right shoulder was prepped appropriately after time out was performed.   Sterile technique was observed and injection of 1 cc of Depo-Medrol 40 mg with several cc's of plain xylocaine. Anesthesia was provided by ethyl chloride and a 20-gauge needle was used to inject the shoulder area. A posterior approach was used.  The injection was tolerated well.  A band aid dressing was applied.  The patient was advised to apply ice later today and tomorrow to the injection sight as needed.  See as needed.  Call if any problem.  Precautions discussed.   Electronically Signed Darreld Mclean, MD 9/21/20219:52 AM

## 2020-01-18 ENCOUNTER — Telehealth: Payer: Self-pay

## 2020-01-18 NOTE — Telephone Encounter (Signed)
Pt called today for results from a culture that was taken.

## 2020-01-18 NOTE — Telephone Encounter (Signed)
Pt called again for results of labs. Please advise

## 2020-01-19 LAB — CULT, FUNGUS, SKIN,HAIR,NAIL W/KOH
MICRO NUMBER:: 10915183
SPECIMEN QUALITY:: ADEQUATE

## 2020-01-19 LAB — TIQ-NTM

## 2020-01-19 LAB — HOUSE ACCOUNT TRACKING

## 2020-01-19 NOTE — Telephone Encounter (Signed)
Left VM to call back to go over results

## 2020-01-20 ENCOUNTER — Other Ambulatory Visit: Payer: Self-pay | Admitting: Podiatry

## 2020-01-20 ENCOUNTER — Telehealth: Payer: Self-pay | Admitting: Podiatry

## 2020-01-20 DIAGNOSIS — Z79899 Other long term (current) drug therapy: Secondary | ICD-10-CM

## 2020-01-20 NOTE — Telephone Encounter (Signed)
Patient wanted to know if you could send the bloodwork order to [facility] in [place]. He is getting it done tomorrow

## 2020-01-20 NOTE — Progress Notes (Signed)
Patient called back to go over the nail culture results. After discussion he wants to proceed with oral Lamisil. Discussed side affects of the medication. I have ordered a CBC and LFT to be done prior to starting the medication.

## 2020-01-21 ENCOUNTER — Other Ambulatory Visit: Payer: Self-pay | Admitting: Podiatry

## 2020-01-21 DIAGNOSIS — Z79899 Other long term (current) drug therapy: Secondary | ICD-10-CM

## 2020-01-21 NOTE — Telephone Encounter (Signed)
done

## 2020-01-21 NOTE — Progress Notes (Signed)
c 

## 2020-02-02 ENCOUNTER — Telehealth: Payer: Self-pay | Admitting: Podiatry

## 2020-02-02 NOTE — Telephone Encounter (Signed)
Attempted to call patient to go over results of the blood work. Unable to reach patient and left VM to call back.

## 2020-02-03 ENCOUNTER — Telehealth: Payer: Self-pay

## 2020-02-03 ENCOUNTER — Other Ambulatory Visit: Payer: Self-pay | Admitting: Podiatry

## 2020-02-03 ENCOUNTER — Telehealth: Payer: Self-pay | Admitting: Podiatry

## 2020-02-03 MED ORDER — TERBINAFINE HCL 250 MG PO TABS: 250.0000 mg | ORAL_TABLET | Freq: Every day | ORAL | 0 refills | Status: DC

## 2020-02-03 NOTE — Progress Notes (Signed)
Reviewed blood work with patient. LFT is normal but upper limits. Discussed risks of Lamisil and he understands and wants to proceed. Sent 30 days of the medication and we will schedule him to follow up in 4 weeks

## 2020-02-03 NOTE — Telephone Encounter (Signed)
Patient called in returning call, patient stated they are waiting for call

## 2020-02-03 NOTE — Telephone Encounter (Signed)
Returning a missed call from Dr. Ardelle Anton. Please advise.

## 2020-02-03 NOTE — Telephone Encounter (Signed)
Attempted to call patient- not able to reach him and left VM to call back.

## 2020-02-04 ENCOUNTER — Ambulatory Visit: Payer: Medicaid Other | Admitting: Podiatry

## 2020-02-15 ENCOUNTER — Ambulatory Visit: Payer: Medicaid Other | Admitting: Podiatry

## 2020-03-14 ENCOUNTER — Other Ambulatory Visit: Payer: Self-pay

## 2020-03-14 ENCOUNTER — Ambulatory Visit (INDEPENDENT_AMBULATORY_CARE_PROVIDER_SITE_OTHER): Payer: Medicaid Other | Admitting: Podiatry

## 2020-03-14 DIAGNOSIS — M722 Plantar fascial fibromatosis: Secondary | ICD-10-CM

## 2020-03-14 DIAGNOSIS — M779 Enthesopathy, unspecified: Secondary | ICD-10-CM

## 2020-03-14 DIAGNOSIS — Z79899 Other long term (current) drug therapy: Secondary | ICD-10-CM | POA: Diagnosis not present

## 2020-03-14 NOTE — Patient Instructions (Signed)
Terbinafine oral granules What is this medicine? TERBINAFINE (TER bin a feen) is an antifungal medicine. It is used to treat certain kinds of fungal or yeast infections. This medicine may be used for other purposes; ask your health care provider or pharmacist if you have questions. COMMON BRAND NAME(S): Lamisil What should I tell my health care provider before I take this medicine? They need to know if you have any of these conditions:  drink alcoholic beverages  kidney disease  liver disease  an unusual or allergic reaction to Terbinafine, other medicines, foods, dyes, or preservatives  pregnant or trying to get pregnant  breast-feeding How should I use this medicine? Take this medicine by mouth. Follow the directions on the prescription label. Hold packet with cut line on top. Shake packet gently to settle contents. Tear packet open along cut line, or use scissors to cut across line. Carefully pour the entire contents of packet onto a spoonful of a soft food, such as pudding or other soft, non-acidic food such as mashed potatoes (do NOT use applesauce or a fruit-based food). If two packets are required for each dose, you may either sprinkle the content of both packets on one spoonful of non-acidic food, or sprinkle the contents of both packets on two spoonfuls of non-acidic food. Make sure that no granules remain in the packet. Swallow the mxiture of the food and granules without chewing. Take your medicine at regular intervals. Do not take it more often than directed. Take all of your medicine as directed even if you think you are better. Do not skip doses or stop your medicine early. Contact your pediatrician or health care professional regarding the use of this medicine in children. While this medicine may be prescribed for children as young as 4 years for selected conditions, precautions do apply. Overdosage: If you think you have taken too much of this medicine contact a poison control  center or emergency room at once. NOTE: This medicine is only for you. Do not share this medicine with others. What if I miss a dose? If you miss a dose, take it as soon as you can. If it is almost time for your next dose, take only that dose. Do not take double or extra doses. What may interact with this medicine? Do not take this medicine with any of the following medications:  thioridazine This medicine may also interact with the following medications:  beta-blockers  caffeine  cimetidine  cyclosporine  MAOIs like Carbex, Eldepryl, Marplan, Nardil, and Parnate  medicines for fungal infections like fluconazole and ketoconazole  medicines for irregular heartbeat like amiodarone, flecainide and propafenone  rifampin  SSRIs like citalopram, escitalopram, fluoxetine, fluvoxamine, paroxetine and sertraline  tricyclic antidepressants like amitriptyline, clomipramine, desipramine, imipramine, nortriptyline, and others  warfarin This list may not describe all possible interactions. Give your health care provider a list of all the medicines, herbs, non-prescription drugs, or dietary supplements you use. Also tell them if you smoke, drink alcohol, or use illegal drugs. Some items may interact with your medicine. What should I watch for while using this medicine? Your doctor may monitor your liver function. Tell your doctor right away if you have nausea or vomiting, loss of appetite, stomach pain on your right upper side, yellow skin, dark urine, light stools, or are over tired. This medicine may cause serious skin reactions. They can happen weeks to months after starting the medicine. Contact your health care provider right away if you notice fevers or flu-like symptoms   with a rash. The rash may be red or purple and then turn into blisters or peeling of the skin. Or, you might notice a red rash with swelling of the face, lips or lymph nodes in your neck or under your arms. You need to take  this medicine for 6 weeks or longer to cure the fungal infection. Take your medicine regularly for as long as your doctor or health care provider tells you to. What side effects may I notice from receiving this medicine? Side effects that you should report to your doctor or health care professional as soon as possible:  allergic reactions like skin rash or hives, swelling of the face, lips, or tongue  change in vision  dark urine  fever or infection  general ill feeling or flu-like symptoms  light-colored stools  loss of appetite, nausea  rash, fever, and swollen lymph nodes  redness, blistering, peeling or loosening of the skin, including inside the mouth  right upper belly pain  unusually weak or tired  yellowing of the eyes or skin Side effects that usually do not require medical attention (report to your doctor or health care professional if they continue or are bothersome):  changes in taste  diarrhea  hair loss  muscle or joint pain  stomach upset This list may not describe all possible side effects. Call your doctor for medical advice about side effects. You may report side effects to FDA at 1-800-FDA-1088. Where should I keep my medicine? Keep out of the reach of children. Store at room temperature between 15 and 30 degrees C (59 and 86 degrees F). Throw away any unused medicine after the expiration date. NOTE: This sheet is a summary. It may not cover all possible information. If you have questions about this medicine, talk to your doctor, pharmacist, or health care provider.  2020 Elsevier/Gold Standard (2018-07-11 15:35:11)   Plantar Fasciitis (Heel Spur Syndrome) with Rehab The plantar fascia is a fibrous, ligament-like, soft-tissue structure that spans the bottom of the foot. Plantar fasciitis is a condition that causes pain in the foot due to inflammation of the tissue. SYMPTOMS   Pain and tenderness on the underneath side of the foot.  Pain that  worsens with standing or walking. CAUSES  Plantar fasciitis is caused by irritation and injury to the plantar fascia on the underneath side of the foot. Common mechanisms of injury include:  Direct trauma to bottom of the foot.  Damage to a small nerve that runs under the foot where the main fascia attaches to the heel bone.  Stress placed on the plantar fascia due to bone spurs. RISK INCREASES WITH:   Activities that place stress on the plantar fascia (running, jumping, pivoting, or cutting).  Poor strength and flexibility.  Improperly fitted shoes.  Tight calf muscles.  Flat feet.  Failure to warm-up properly before activity.  Obesity. PREVENTION  Warm up and stretch properly before activity.  Allow for adequate recovery between workouts.  Maintain physical fitness:  Strength, flexibility, and endurance.  Cardiovascular fitness.  Maintain a health body weight.  Avoid stress on the plantar fascia.  Wear properly fitted shoes, including arch supports for individuals who have flat feet.  PROGNOSIS  If treated properly, then the symptoms of plantar fasciitis usually resolve without surgery. However, occasionally surgery is necessary.  RELATED COMPLICATIONS   Recurrent symptoms that may result in a chronic condition.  Problems of the lower back that are caused by compensating for the injury, such as limping.  Pain or weakness of the foot during push-off following surgery.  Chronic inflammation, scarring, and partial or complete fascia tear, occurring more often from repeated injections.  TREATMENT  Treatment initially involves the use of ice and medication to help reduce pain and inflammation. The use of strengthening and stretching exercises may help reduce pain with activity, especially stretches of the Achilles tendon. These exercises may be performed at home or with a therapist. Your caregiver may recommend that you use heel cups of arch supports to help  reduce stress on the plantar fascia. Occasionally, corticosteroid injections are given to reduce inflammation. If symptoms persist for greater than 6 months despite non-surgical (conservative), then surgery may be recommended.   MEDICATION   If pain medication is necessary, then nonsteroidal anti-inflammatory medications, such as aspirin and ibuprofen, or other minor pain relievers, such as acetaminophen, are often recommended.  Do not take pain medication within 7 days before surgery.  Prescription pain relievers may be given if deemed necessary by your caregiver. Use only as directed and only as much as you need.  Corticosteroid injections may be given by your caregiver. These injections should be reserved for the most serious cases, because they may only be given a certain number of times.  HEAT AND COLD  Cold treatment (icing) relieves pain and reduces inflammation. Cold treatment should be applied for 10 to 15 minutes every 2 to 3 hours for inflammation and pain and immediately after any activity that aggravates your symptoms. Use ice packs or massage the area with a piece of ice (ice massage).  Heat treatment may be used prior to performing the stretching and strengthening activities prescribed by your caregiver, physical therapist, or athletic trainer. Use a heat pack or soak the injury in warm water.  SEEK IMMEDIATE MEDICAL CARE IF:  Treatment seems to offer no benefit, or the condition worsens.  Any medications produce adverse side effects.  EXERCISES- RANGE OF MOTION (ROM) AND STRETCHING EXERCISES - Plantar Fasciitis (Heel Spur Syndrome) These exercises may help you when beginning to rehabilitate your injury. Your symptoms may resolve with or without further involvement from your physician, physical therapist or athletic trainer. While completing these exercises, remember:   Restoring tissue flexibility helps normal motion to return to the joints. This allows healthier, less  painful movement and activity.  An effective stretch should be held for at least 30 seconds.  A stretch should never be painful. You should only feel a gentle lengthening or release in the stretched tissue.  RANGE OF MOTION - Toe Extension, Flexion  Sit with your right / left leg crossed over your opposite knee.  Grasp your toes and gently pull them back toward the top of your foot. You should feel a stretch on the bottom of your toes and/or foot.  Hold this stretch for 10 seconds.  Now, gently pull your toes toward the bottom of your foot. You should feel a stretch on the top of your toes and or foot.  Hold this stretch for 10 seconds. Repeat  times. Complete this stretch 3 times per day.   RANGE OF MOTION - Ankle Dorsiflexion, Active Assisted  Remove shoes and sit on a chair that is preferably not on a carpeted surface.  Place right / left foot under knee. Extend your opposite leg for support.  Keeping your heel down, slide your right / left foot back toward the chair until you feel a stretch at your ankle or calf. If you do not feel a stretch,  slide your bottom forward to the edge of the chair, while still keeping your heel down.  Hold this stretch for 10 seconds. Repeat 3 times. Complete this stretch 2 times per day.   STRETCH  Gastroc, Standing  Place hands on wall.  Extend right / left leg, keeping the front knee somewhat bent.  Slightly point your toes inward on your back foot.  Keeping your right / left heel on the floor and your knee straight, shift your weight toward the wall, not allowing your back to arch.  You should feel a gentle stretch in the right / left calf. Hold this position for 10 seconds. Repeat 3 times. Complete this stretch 2 times per day.  STRETCH  Soleus, Standing  Place hands on wall.  Extend right / left leg, keeping the other knee somewhat bent.  Slightly point your toes inward on your back foot.  Keep your right / left heel on the  floor, bend your back knee, and slightly shift your weight over the back leg so that you feel a gentle stretch deep in your back calf.  Hold this position for 10 seconds. Repeat 3 times. Complete this stretch 2 times per day.  STRETCH  Gastrocsoleus, Standing  Note: This exercise can place a lot of stress on your foot and ankle. Please complete this exercise only if specifically instructed by your caregiver.   Place the ball of your right / left foot on a step, keeping your other foot firmly on the same step.  Hold on to the wall or a rail for balance.  Slowly lift your other foot, allowing your body weight to press your heel down over the edge of the step.  You should feel a stretch in your right / left calf.  Hold this position for 10 seconds.  Repeat this exercise with a slight bend in your right / left knee. Repeat 3 times. Complete this stretch 2 times per day.   STRENGTHENING EXERCISES - Plantar Fasciitis (Heel Spur Syndrome)  These exercises may help you when beginning to rehabilitate your injury. They may resolve your symptoms with or without further involvement from your physician, physical therapist or athletic trainer. While completing these exercises, remember:   Muscles can gain both the endurance and the strength needed for everyday activities through controlled exercises.  Complete these exercises as instructed by your physician, physical therapist or athletic trainer. Progress the resistance and repetitions only as guided.  STRENGTH - Towel Curls  Sit in a chair positioned on a non-carpeted surface.  Place your foot on a towel, keeping your heel on the floor.  Pull the towel toward your heel by only curling your toes. Keep your heel on the floor. Repeat 3 times. Complete this exercise 2 times per day.  STRENGTH - Ankle Inversion  Secure one end of a rubber exercise band/tubing to a fixed object (table, pole). Loop the other end around your foot just before your  toes.  Place your fists between your knees. This will focus your strengthening at your ankle.  Slowly, pull your big toe up and in, making sure the band/tubing is positioned to resist the entire motion.  Hold this position for 10 seconds.  Have your muscles resist the band/tubing as it slowly pulls your foot back to the starting position. Repeat 3 times. Complete this exercises 2 times per day.  Document Released: 04/02/2005 Document Revised: 06/25/2011 Document Reviewed: 07/15/2008 [employer] Patient Information 2014 [employer].   

## 2020-03-17 DIAGNOSIS — M722 Plantar fascial fibromatosis: Secondary | ICD-10-CM | POA: Diagnosis not present

## 2020-03-17 MED ORDER — TRIAMCINOLONE ACETONIDE 10 MG/ML IJ SUSP
10.0000 mg | Freq: Once | INTRAMUSCULAR | Status: AC
  Administered 2020-03-17: 10 mg

## 2020-03-17 NOTE — Progress Notes (Signed)
Subjective: 54 year old male presents the office today for follow-up evaluation of toenail fungus, currently on Lamisil.  He states he has tolerated this medication well without any side effects.  Also presents today for follow evaluation of left foot pain.  He states the pain is intermittent he feels it is more plantar fasciitis pain at this time.  He denies recent injury or falls or changes since I last saw him he has no other concerns today. Denies any systemic complaints such as fevers, chills, nausea, vomiting. No acute changes since last appointment, and no other complaints at this time.   Objective: AAO x3, NAD DP/PT pulses palpable bilaterally, CRT less than 3 seconds There is minimal clear on the proximal nail folds but overall nails are unchanged.  There is no pain in the nails today and there is no redness or drainage or any signs of infection. The majority tenderness today is to palpation on plantar aspect of the heel the insertion of the plantar fascia.  There is no area pinpoint tenderness.  Some mild diffuse tenderness the dorsal lateral aspect the foot no specific area of tenderness and there is no edema, erythema. Flexor, extensor tendons appear to be intact.  Achilles tendon is intact.  MMT 5/5. No pain with calf compression, swelling, warmth, erythema  Assessment: Onychomycosis, currently on Lamisil; plantar fasciitis/capsulitis  Plan: -All treatment options discussed with the patient including all alternatives, risks, complications.  -Today a steroid just informed the plantar fashion.  See procedure note below.  Continue stretching, icing daily as well as wearing good shoes with arch supports. -Continue Lamisil.  We will recheck a CBC and LFT.  Continue to monitor any side effects. -Patient encouraged to call the office with any questions, concerns, change in symptoms.   Procedure: Injection Tendon/Ligament Discussed alternatives, risks, complications and verbal consent was  obtained.  Location: LEFT plantar fascia at the glabrous junction; medial approach. Skin Prep: Alcohol  Injectate: 0.5cc 0.5% marcaine plain, 0.5 cc 2% lidocaine plain and, 1 cc kenalog 10. Disposition: Patient tolerated procedure well. Injection site dressed with a band-aid.  Post-injection care was discussed and return precautions discussed.   Vivi Barrack DPM

## 2020-03-18 ENCOUNTER — Other Ambulatory Visit: Payer: Self-pay | Admitting: Podiatry

## 2020-03-18 LAB — CBC WITH DIFFERENTIAL/PLATELET
Absolute Monocytes: 810 cells/uL (ref 200–950)
Basophils Absolute: 57 cells/uL (ref 0–200)
Basophils Relative: 0.7 %
Eosinophils Absolute: 57 cells/uL (ref 15–500)
Eosinophils Relative: 0.7 %
HCT: 41.1 % (ref 38.5–50.0)
Hemoglobin: 14.1 g/dL (ref 13.2–17.1)
Lymphs Abs: 1855 cells/uL (ref 850–3900)
MCH: 29.7 pg (ref 27.0–33.0)
MCHC: 34.3 g/dL (ref 32.0–36.0)
MCV: 86.7 fL (ref 80.0–100.0)
MPV: 10.2 fL (ref 7.5–12.5)
Monocytes Relative: 10 %
Neutro Abs: 5322 cells/uL (ref 1500–7800)
Neutrophils Relative %: 65.7 %
Platelets: 296 10*3/uL (ref 140–400)
RBC: 4.74 10*6/uL (ref 4.20–5.80)
RDW: 13 % (ref 11.0–15.0)
Total Lymphocyte: 22.9 %
WBC: 8.1 10*3/uL (ref 3.8–10.8)

## 2020-03-18 LAB — HEPATIC FUNCTION PANEL
AG Ratio: 1.5 (calc) (ref 1.0–2.5)
ALT: 32 U/L (ref 9–46)
AST: 24 U/L (ref 10–35)
Albumin: 4.4 g/dL (ref 3.6–5.1)
Alkaline phosphatase (APISO): 40 U/L (ref 35–144)
Bilirubin, Direct: 0.1 mg/dL (ref 0.0–0.2)
Globulin: 3 g/dL (calc) (ref 1.9–3.7)
Indirect Bilirubin: 0.3 mg/dL (calc) (ref 0.2–1.2)
Total Bilirubin: 0.4 mg/dL (ref 0.2–1.2)
Total Protein: 7.4 g/dL (ref 6.1–8.1)

## 2020-03-18 LAB — BASIC METABOLIC PANEL
BUN: 18 mg/dL (ref 7–25)
CO2: 25 mmol/L (ref 20–32)
Calcium: 9.5 mg/dL (ref 8.6–10.3)
Chloride: 100 mmol/L (ref 98–110)
Creat: 1.15 mg/dL (ref 0.70–1.33)
Glucose, Bld: 143 mg/dL — ABNORMAL HIGH (ref 65–139)
Potassium: 4.1 mmol/L (ref 3.5–5.3)
Sodium: 141 mmol/L (ref 135–146)

## 2020-03-18 MED ORDER — TERBINAFINE HCL 250 MG PO TABS: 250.0000 mg | ORAL_TABLET | Freq: Every day | ORAL | 0 refills | Status: DC

## 2020-05-10 ENCOUNTER — Encounter: Payer: Self-pay | Admitting: Orthopaedic Surgery

## 2020-05-10 ENCOUNTER — Other Ambulatory Visit: Payer: Self-pay

## 2020-05-10 ENCOUNTER — Ambulatory Visit (INDEPENDENT_AMBULATORY_CARE_PROVIDER_SITE_OTHER): Payer: Medicaid Other | Admitting: Orthopaedic Surgery

## 2020-05-10 DIAGNOSIS — G8929 Other chronic pain: Secondary | ICD-10-CM

## 2020-05-10 DIAGNOSIS — M25511 Pain in right shoulder: Secondary | ICD-10-CM

## 2020-05-10 DIAGNOSIS — Z6841 Body Mass Index (BMI) 40.0 and over, adult: Secondary | ICD-10-CM

## 2020-05-10 DIAGNOSIS — M25512 Pain in left shoulder: Secondary | ICD-10-CM

## 2020-05-10 NOTE — Progress Notes (Signed)
PROCEDURE NOTE:  The patient request injection, verbal consent was obtained.  The right shoulder was prepped appropriately after time out was performed.   Sterile technique was observed and injection of 1 cc of Depo-Medrol 40 mg with several cc's of plain xylocaine. Anesthesia was provided by ethyl chloride and a 20-gauge needle was used to inject the shoulder area. A posterior approach was used.  The injection was tolerated well.  A band aid dressing was applied.  The patient was advised to apply ice later today and tomorrow to the injection sight as needed.  PROCEDURE NOTE:  The patient request injection, verbal consent was obtained.  The left shoulder was prepped appropriately after time out was performed.   Sterile technique was observed and injection of 1 cc of Depo-Medrol 40 mg with several cc's of plain xylocaine. Anesthesia was provided by ethyl chloride and a 20-gauge needle was used to inject the shoulder area. A posterior approach was used.  The injection was tolerated well.  A band aid dressing was applied.  The patient was advised to apply ice later today and tomorrow to the injection sight as needed.   I will see as needed.  Electronically Signed Darreld Mclean, MD 1/25/20228:57 AM

## 2020-05-17 ENCOUNTER — Other Ambulatory Visit: Payer: Self-pay

## 2020-05-17 ENCOUNTER — Ambulatory Visit (INDEPENDENT_AMBULATORY_CARE_PROVIDER_SITE_OTHER): Payer: Medicaid Other | Admitting: Podiatry

## 2020-05-17 DIAGNOSIS — M722 Plantar fascial fibromatosis: Secondary | ICD-10-CM

## 2020-05-17 DIAGNOSIS — M79674 Pain in right toe(s): Secondary | ICD-10-CM

## 2020-05-17 DIAGNOSIS — B351 Tinea unguium: Secondary | ICD-10-CM | POA: Diagnosis not present

## 2020-05-17 DIAGNOSIS — M79675 Pain in left toe(s): Secondary | ICD-10-CM | POA: Diagnosis not present

## 2020-05-22 NOTE — Progress Notes (Signed)
Subjective: 55 y.o. returns the office today for painful, elongated, thickened toenails which he cannot trim himself and also for follow-up evaluation of plantar fasciitis.  He is also finished the course of Lamisil which has been doing well.  He is not currently expensing any pain to his foot the injection helped quite a bit last appointment.. Denies any redness or drainage around the nails. Denies any acute changes since last appointment and no new complaints today. Denies any systemic complaints such as fevers, chills, nausea, vomiting.   PCP: Gareth Morgan, MD   Objective: AAO 3, NAD DP/PT pulses palpable, CRT less than 3 seconds Nails hypertrophic, dystrophic, elongated, brittle, discolored 10. There is tenderness overlying the nails 1-5 bilaterally. There is no surrounding erythema or drainage along the nail sites.  There is some clearing of the proximal nail folds but overall the nail still appear to be dystrophic and, ptosis. At this time there is no tenderness palpation the course or insertion of plantar fascia.  There is no area to point tenderness.  No increased edema bilateral and no areas of discomfort.  Flexor, extensor tendons appear to be intact.  MMT 5/5. No open lesions or pre-ulcerative lesions are identified. No pain with calf compression, swelling, warmth, erythema.  Assessment: Patient presents with symptomatic onychomycosis; plan fasciitis with improvement  Plan: -Treatment options including alternatives, risks, complications were discussed -Nails sharply debrided 10 without complication/bleeding.  When I see him back we will likely do another round of Lamisil he wants to work and hold off on this for now. -For plantar fasciitis continue stretching, icing daily.  Continue with supportive shoes. -Discussed daily foot inspection. If there are any changes, to call the office immediately.  -Follow-up in 3 months or sooner if any problems are to arise. In the meantime,  encouraged to call the office with any questions, concerns, changes symptoms.  Ovid Curd, DPM

## 2020-05-26 NOTE — Telephone Encounter (Signed)
error 

## 2020-06-14 ENCOUNTER — Encounter: Payer: Self-pay | Admitting: Podiatry

## 2020-06-14 ENCOUNTER — Other Ambulatory Visit: Payer: Self-pay

## 2020-06-14 ENCOUNTER — Ambulatory Visit (INDEPENDENT_AMBULATORY_CARE_PROVIDER_SITE_OTHER): Payer: Medicaid Other | Admitting: Podiatry

## 2020-06-14 DIAGNOSIS — B351 Tinea unguium: Secondary | ICD-10-CM

## 2020-06-14 DIAGNOSIS — M722 Plantar fascial fibromatosis: Secondary | ICD-10-CM

## 2020-06-14 MED ORDER — CICLOPIROX 8 % EX SOLN: Freq: Every day | CUTANEOUS | 2 refills | Status: DC

## 2020-06-14 NOTE — Patient Instructions (Signed)

## 2020-06-15 NOTE — Progress Notes (Signed)
Subjective: 55 y.o. returns the office today for follow-up evaluation of nail fungus as well as chronic fasciitis.  He states he is doing well the heel pain is much improved.  Get some occasional discomfort after being on his feet for several hours as he works at [employer] but otherwise has been doing well.  Also the nails continue to improve.  No pain of the nails and no redness or drainage or any signs of infection. Denies any acute changes since last appointment and no new complaints today. Denies any systemic complaints such as fevers, chills, nausea, vomiting.   PCP: Gareth Morgan, MD   Objective: AAO 3, NAD DP/PT pulses palpable, CRT less than 3 seconds There is still dystrophy of the toenails with yellow-brown discoloration.  There is some clearing present.  No pain in the nails there is no redness or drainage or signs of infection. There is no tenderness palpation of the course or insertion of plantar fashion.  Plantar fascial patient intact.  No pain with Achilles tendon.  No area pinpoint tenderness.  No significant edema, erythema. No open lesions or pre-ulcerative lesions are identified. No pain with calf compression, swelling, warmth, erythema.  Assessment: Patient presents with improving plan fasciitis, onychomycosis  Plan: -Treatment options including alternatives, risks, complications were discussed -Discussed restarting Lamisil advised to wait till seen back about 3 months.  He can use topical medication for now.  Prescribed Penlac -Continue stretching, icing daily and discussed shoe modifications good arch supports which already has.  Monitor for any recurrence of plantar fasciitis.  Vivi Barrack DPM

## 2020-08-23 ENCOUNTER — Encounter: Payer: Self-pay | Admitting: Orthopaedic Surgery

## 2020-08-23 ENCOUNTER — Other Ambulatory Visit: Payer: Self-pay

## 2020-08-23 ENCOUNTER — Ambulatory Visit: Payer: Medicaid Other | Admitting: Orthopaedic Surgery

## 2020-08-23 DIAGNOSIS — Z6841 Body Mass Index (BMI) 40.0 and over, adult: Secondary | ICD-10-CM

## 2020-08-23 DIAGNOSIS — M25511 Pain in right shoulder: Secondary | ICD-10-CM

## 2020-08-23 DIAGNOSIS — G8929 Other chronic pain: Secondary | ICD-10-CM | POA: Diagnosis not present

## 2020-08-23 NOTE — Progress Notes (Signed)
PROCEDURE NOTE:  The patient request injection, verbal consent was obtained.  The right shoulder was prepped appropriately after time out was performed.   Sterile technique was observed and injection of 1 cc of Celestone 6 mg with several cc's of plain xylocaine. Anesthesia was provided by ethyl chloride and a 20-gauge needle was used to inject the shoulder area. A posterior approach was used.  The injection was tolerated well.  A band aid dressing was applied.  The patient was advised to apply ice later today and tomorrow to the injection sight as needed.  Return PRN.  Electronically Signed Darreld Mclean, MD 5/10/202210:09 AM

## 2020-08-26 ENCOUNTER — Ambulatory Visit: Payer: Medicaid Other | Admitting: Podiatry

## 2020-09-20 ENCOUNTER — Ambulatory Visit: Payer: Medicaid Other | Admitting: Podiatry

## 2020-09-29 ENCOUNTER — Other Ambulatory Visit: Payer: Self-pay

## 2020-09-29 ENCOUNTER — Ambulatory Visit: Payer: Medicaid Other | Admitting: Podiatry

## 2020-09-29 DIAGNOSIS — B351 Tinea unguium: Secondary | ICD-10-CM

## 2020-09-29 DIAGNOSIS — M79674 Pain in right toe(s): Secondary | ICD-10-CM | POA: Diagnosis not present

## 2020-09-29 DIAGNOSIS — M79675 Pain in left toe(s): Secondary | ICD-10-CM | POA: Diagnosis not present

## 2020-10-03 NOTE — Progress Notes (Signed)
Subjective: 55 y.o. returns the office today for painful, elongated, thickened toenails which he  cannot trim himself. Denies any redness or drainage around the nails.  He has been using the topical which is helping some the nails are still thick and discolored.  Has not had any significant plan fasciitis symptoms and no other concerns.  Denies any acute changes since last appointment and no new complaints today. Denies any systemic complaints such as fevers, chills, nausea, vomiting.   PCP: Gareth Morgan, MD   Objective: AAO 3, NAD DP/PT pulses palpable, CRT less than 3 seconds Nails hypertrophic, dystrophic, elongated, brittle, discolored 10. There is tenderness overlying the nails 1-5 bilaterally. There is no surrounding erythema or drainage along the nail sites. No open lesions or pre-ulcerative lesions are identified.  No pain with plantar fashion today. No other areas of tenderness bilateral lower extremities. No overlying edema, erythema, increased warmth. No pain with calf compression, swelling, warmth, erythema.  Assessment: Patient presents with symptomatic onychomycosis  Plan: -Treatment options including alternatives, risks, complications were discussed -Nails sharply debrided 10 without complication/bleeding. -Discussed daily foot inspection. If there are any changes, to call the office immediately.  -Follow-up in 3 months or sooner if any problems are to arise. In the meantime, encouraged to call the office with any questions, concerns, changes symptoms.  Ovid Curd, DPM

## 2020-11-08 ENCOUNTER — Encounter: Payer: Self-pay | Admitting: Orthopaedic Surgery

## 2020-11-08 ENCOUNTER — Ambulatory Visit: Payer: Medicaid Other | Admitting: Orthopaedic Surgery

## 2020-11-08 ENCOUNTER — Other Ambulatory Visit: Payer: Self-pay

## 2020-11-08 VITALS — BP 155/104 | HR 85 | Ht 71.0 in | Wt 319.0 lb

## 2020-11-08 DIAGNOSIS — M25511 Pain in right shoulder: Secondary | ICD-10-CM | POA: Diagnosis not present

## 2020-11-08 DIAGNOSIS — M25512 Pain in left shoulder: Secondary | ICD-10-CM | POA: Diagnosis not present

## 2020-11-08 DIAGNOSIS — G8929 Other chronic pain: Secondary | ICD-10-CM

## 2020-11-08 DIAGNOSIS — Z6841 Body Mass Index (BMI) 40.0 and over, adult: Secondary | ICD-10-CM

## 2020-11-08 NOTE — Progress Notes (Signed)
PROCEDURE NOTE:  The patient request injection, verbal consent was obtained.  The right shoulder was prepped appropriately after time out was performed.   Sterile technique was observed and injection of 1 cc of Celestone 6 mg with several cc's of plain xylocaine. Anesthesia was provided by ethyl chloride and a 20-gauge needle was used to inject the shoulder area. A posterior approach was used.  The injection was tolerated well.  A band aid dressing was applied.  The patient was advised to apply ice later today and tomorrow to the injection sight as needed.   PROCEDURE NOTE:  The patient request injection, verbal consent was obtained.  The left shoulder was prepped appropriately after time out was performed.   Sterile technique was observed and injection of 1 cc of Celestone 6 mg with several cc's of plain xylocaine. Anesthesia was provided by ethyl chloride and a 20-gauge needle was used to inject the shoulder area. A posterior approach was used.  The injection was tolerated well.  A band aid dressing was applied.  The patient was advised to apply ice later today and tomorrow to the injection sight as needed.   Return prn.  Call if any problem.  Precautions discussed.  Electronically Signed Darreld Mclean, MD 7/26/20222:48 PM

## 2021-02-16 ENCOUNTER — Other Ambulatory Visit: Payer: Self-pay

## 2021-02-16 ENCOUNTER — Ambulatory Visit: Payer: Medicaid Other | Admitting: Orthopaedic Surgery

## 2021-02-16 ENCOUNTER — Encounter: Payer: Self-pay | Admitting: Orthopaedic Surgery

## 2021-02-16 DIAGNOSIS — M25511 Pain in right shoulder: Secondary | ICD-10-CM | POA: Diagnosis not present

## 2021-02-16 DIAGNOSIS — G8929 Other chronic pain: Secondary | ICD-10-CM | POA: Diagnosis not present

## 2021-02-16 DIAGNOSIS — M25512 Pain in left shoulder: Secondary | ICD-10-CM

## 2021-02-16 DIAGNOSIS — Z6841 Body Mass Index (BMI) 40.0 and over, adult: Secondary | ICD-10-CM

## 2021-02-16 NOTE — Progress Notes (Signed)
PROCEDURE NOTE:  The patient request injection, verbal consent was obtained.  The left shoulder was prepped appropriately after time out was performed.   Sterile technique was observed and injection of 1 cc of DepoMedrol 40mg  with several cc's of plain xylocaine. Anesthesia was provided by ethyl chloride and a 20-gauge needle was used to inject the shoulder area. A posterior approach was used.  The injection was tolerated well.  A band aid dressing was applied.  The patient was advised to apply ice later today and tomorrow to the injection sight as needed.   PROCEDURE NOTE:  The patient request injection, verbal consent was obtained.  The right shoulder was prepped appropriately after time out was performed.   Sterile technique was observed and injection of 1 cc of DepoMedrol 40mg  with several cc's of plain xylocaine. Anesthesia was provided by ethyl chloride and a 20-gauge needle was used to inject the shoulder area. A posterior approach was used.  The injection was tolerated well.  A band aid dressing was applied.  The patient was advised to apply ice later today and tomorrow to the injection sight as needed.   Encounter Diagnoses  Name Primary?   Chronic pain of both shoulders Yes   Morbid obesity (HCC)    Body mass index 45.0-49.9, adult (HCC)    Return as needed.  Call if any problem.  Precautions discussed.  Electronically Signed , MD 11/3/20229:09 AM

## 2021-04-15 IMAGING — MR MRI OF THE RIGHT SHOULDER WITHOUT CONTRAST
4 of 5 series · 22 of 40 positions shown · non-contrast
Comparison: Radiographs 08/13/2018

CLINICAL DATA: Five year history of right shoulder pain. Weight
lifter.

EXAM:
MRI OF THE RIGHT SHOULDER WITHOUT CONTRAST
TECHNIQUE: Multiplanar, multisequence MR imaging of the shoulder was performed.
No intravenous contrast was administered.

[Series 6: PD fat-sat · axial · right · 4.0mm · 0.44mm/px · z∈[-73,+30]mm · 9 of 24 slices shown (1 of 2)]
[im 1/24]
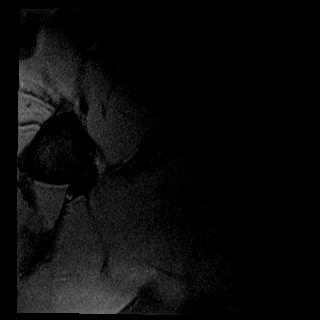
[im 3/24]
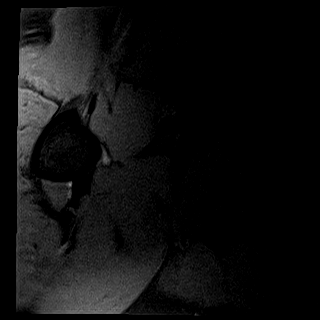
[im 6/24]
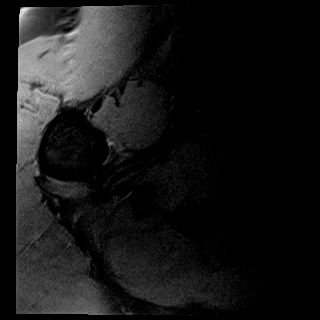
[im 9/24]
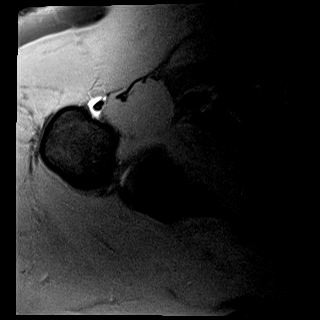
[im 12/24]
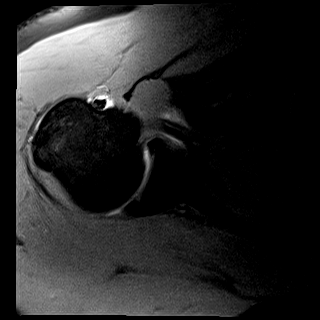
[im 15/24]
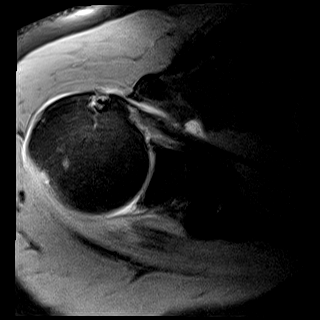
[im 18/24]
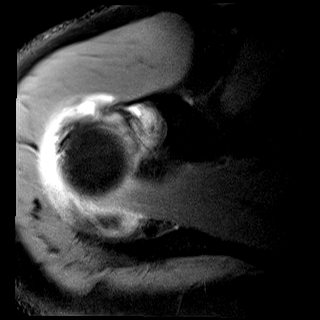
[im 21/24]
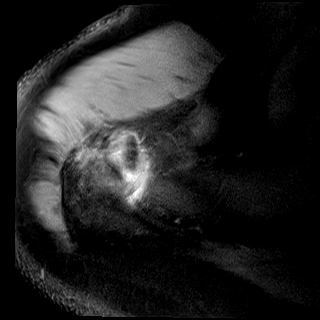
[im 24/24]
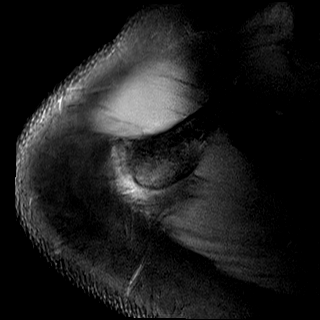

[Series 7: T2 fat-sat · oblique · right · 4.0mm · 0.23mm/px · 3 of 18 slices shown (1 of 2)]
[im 4/18]
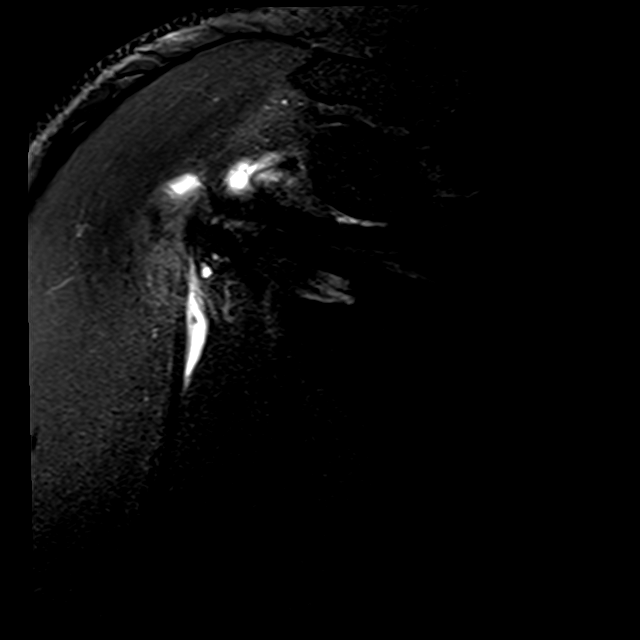
[im 11/18]
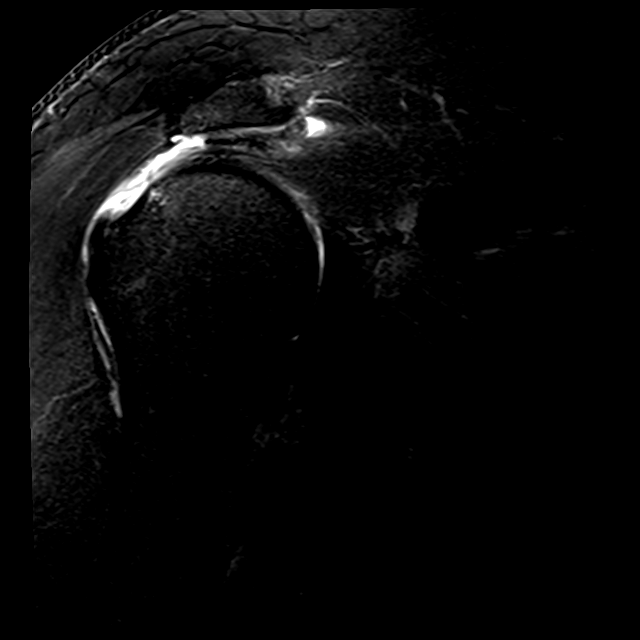
[im 18/18]
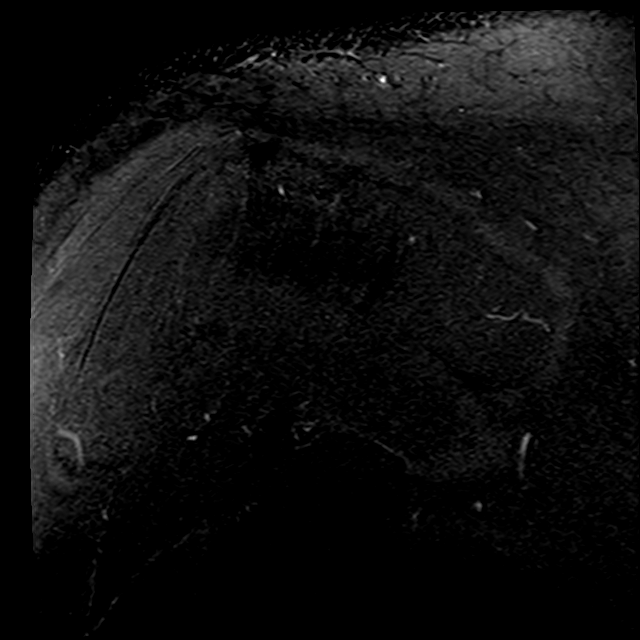

[Series 9: T2 fat-sat · oblique · right · 4.0mm · 0.55mm/px · 3 of 25 slices shown (2 of 2)]
[im 4/25]
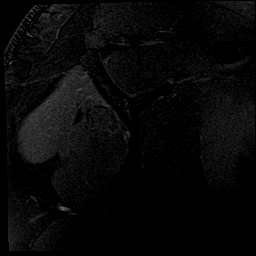
[im 13/25]
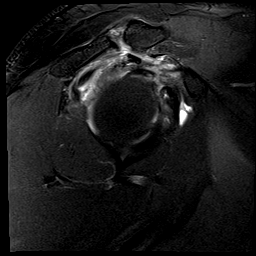
[im 22/25]
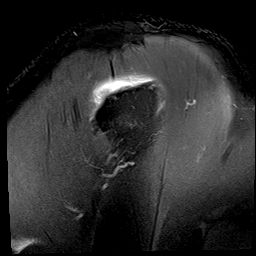

[Series 10: PD fat-sat · oblique · right · 4.0mm · 0.23mm/px · 7 of 19 slices shown (2 of 2)]
[im 1/19]
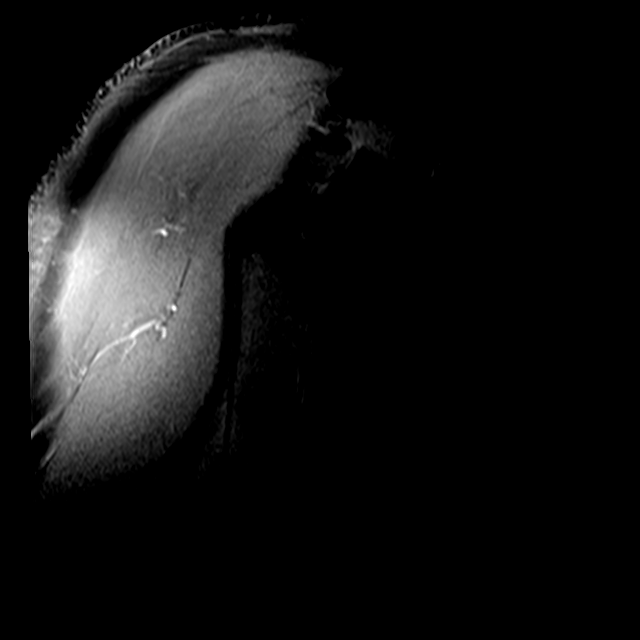
[im 4/19]
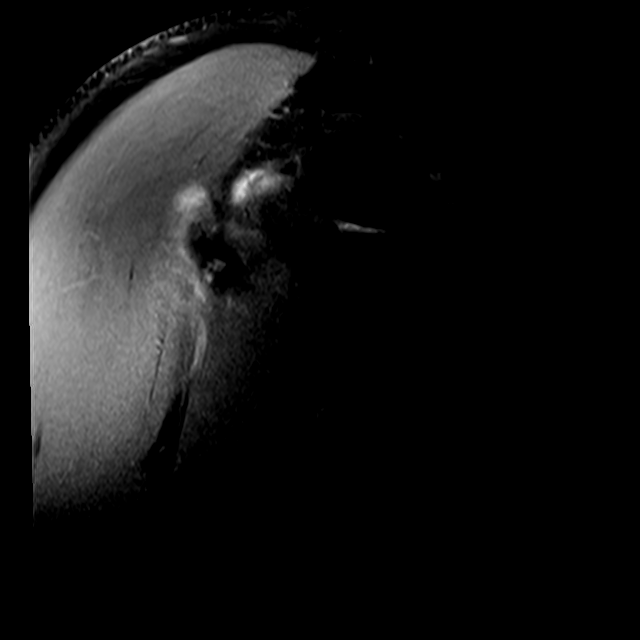
[im 7/19]
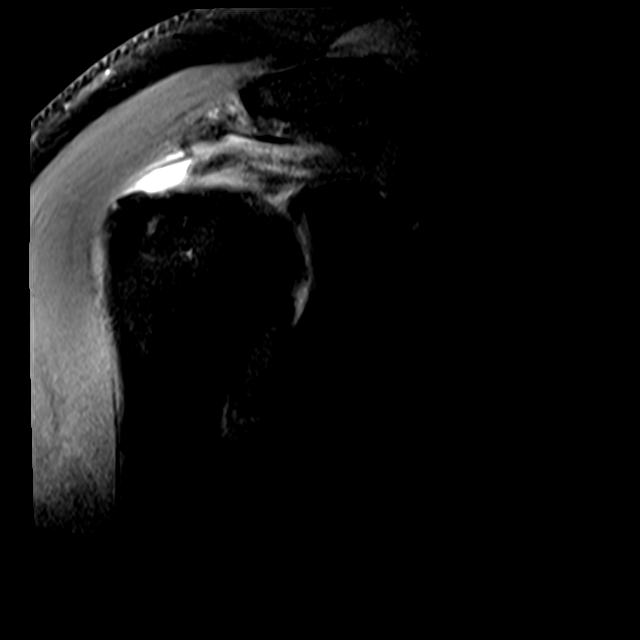
[im 10/19]
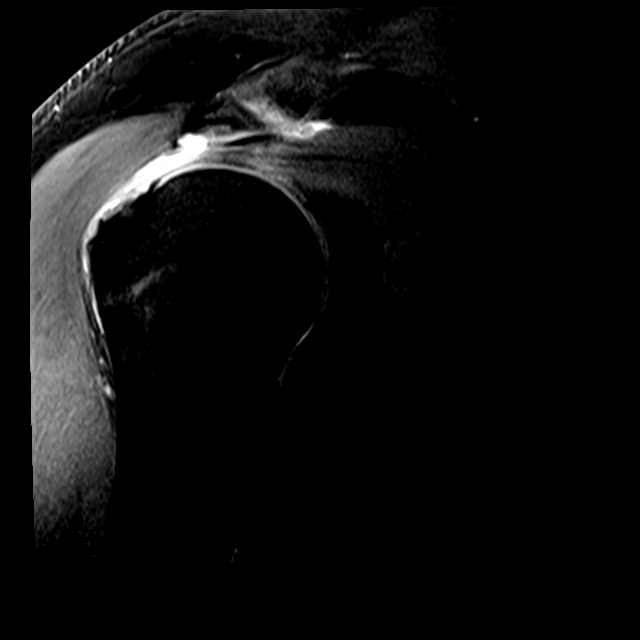
[im 13/19]
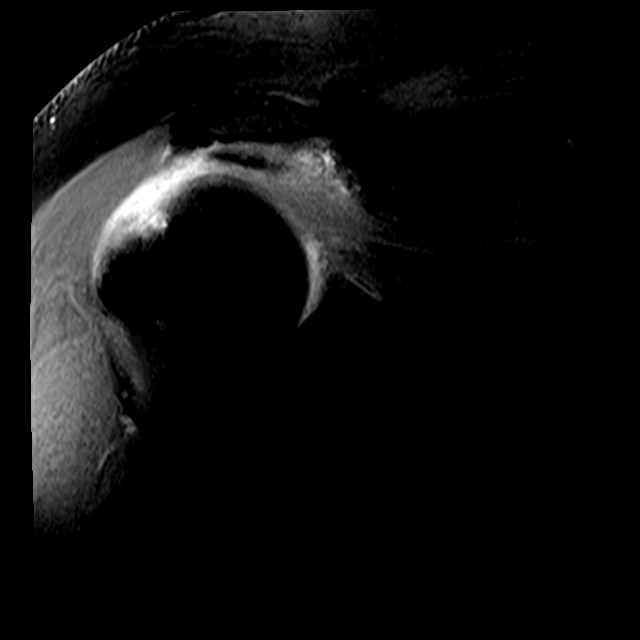
[im 16/19]
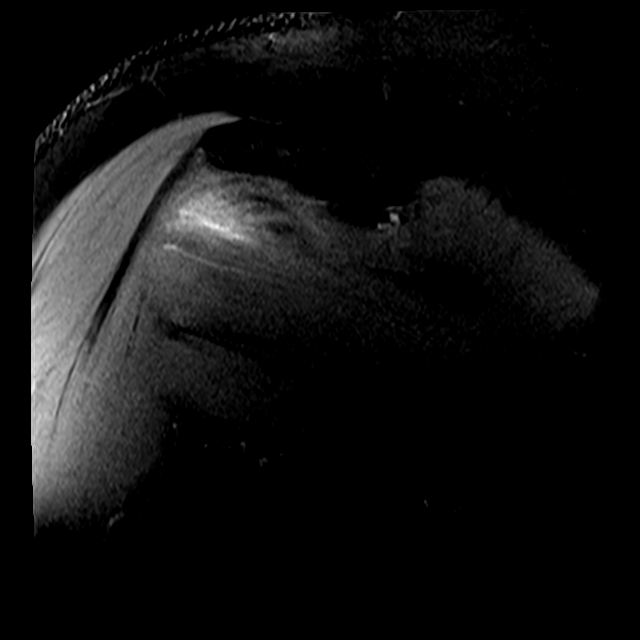
[im 19/19]
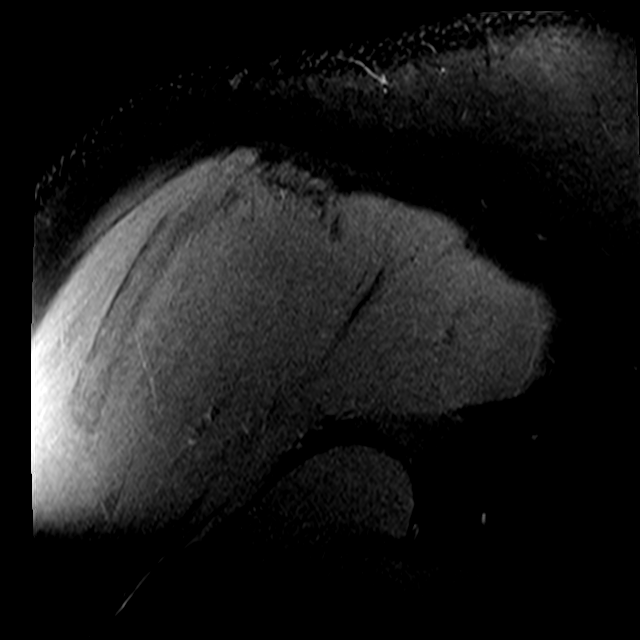

[22 of 40 positions shown; findings below may reference images not displayed]

FINDINGS: Rotator cuff: Large full-thickness retracted rotator cuff tear. This
involves the entire supraspinatus and infraspinatus tendons. The
supraspinatus tendon is retracted approximately 2.3 cm and the
infraspinatus tendon 2.2 cm. The upper fibers of the subscapularis
tendon are also torn and there is significant tendinopathy involving
the mid and lower fibers of the subscapularis.

Muscles: Mild fatty atrophy of the infraspinatus and subscapularis
muscles. No obvious fatty atrophy of the supraspinatus muscle.

Biceps long head: Intact. Significant tendinopathy involving the
intra-articular portion.

Acromioclavicular Joint: Moderate AC joint degenerative changes.
Type 1-2 acromion. Minimal lateral downsloping.

Glenohumeral Joint: No degenerative changes. Small joint effusion.
Mild synovitis.

Labrum:  No obvious labral tears.

Bones:  No significant bony findings.

Other: Expected fluid in the subacromial/subdeltoid bursa.
IMPRESSION: 1. Large full-thickness retracted rotator cuff tear as detailed
above.
2. Significant tendinopathy involving the intra-articular portion of
the long head biceps tendon. No tear/rupture.
3. Intact glenoid labrum.
4. No significant findings for bony impingement.

## 2021-05-23 ENCOUNTER — Ambulatory Visit: Payer: Medicaid Other | Admitting: Orthopaedic Surgery

## 2021-05-23 ENCOUNTER — Encounter: Payer: Self-pay | Admitting: Orthopaedic Surgery

## 2021-05-23 ENCOUNTER — Other Ambulatory Visit: Payer: Self-pay

## 2021-05-23 VITALS — BP 177/117 | HR 77 | Ht 71.0 in | Wt 322.2 lb

## 2021-05-23 DIAGNOSIS — M25511 Pain in right shoulder: Secondary | ICD-10-CM

## 2021-05-23 DIAGNOSIS — M25512 Pain in left shoulder: Secondary | ICD-10-CM

## 2021-05-23 DIAGNOSIS — G8929 Other chronic pain: Secondary | ICD-10-CM

## 2021-05-23 DIAGNOSIS — Z6841 Body Mass Index (BMI) 40.0 and over, adult: Secondary | ICD-10-CM

## 2021-05-23 NOTE — Progress Notes (Signed)
PROCEDURE NOTE:  The patient request injection, verbal consent was obtained.  The left shoulder was prepped appropriately after time out was performed.   Sterile technique was observed and injection of 1 cc of DepoMedrol 40mg  with several cc's of plain xylocaine. Anesthesia was provided by ethyl chloride and a 20-gauge needle was used to inject the shoulder area. A posterior approach was used.  The injection was tolerated well.  A band aid dressing was applied.  The patient was advised to apply ice later today and tomorrow to the injection sight as needed.  PROCEDURE NOTE:  The patient request injection, verbal consent was obtained.  The right shoulder was prepped appropriately after time out was performed.   Sterile technique was observed and injection of 1 cc of DepoMedrol 40mg  with several cc's of plain xylocaine. Anesthesia was provided by ethyl chloride and a 20-gauge needle was used to inject the shoulder area. A posterior approach was used.  The injection was tolerated well.  A band aid dressing was applied.  The patient was advised to apply ice later today and tomorrow to the injection sight as needed.  Encounter Diagnoses  Name Primary?   Chronic pain of both shoulders Yes   Morbid obesity (HCC)    Body mass index 45.0-49.9, adult (HCC)    Return prn.  Call if any problem.  Precautions discussed.  Electronically Signed , MD 2/7/20239:40 AM

## 2021-08-01 ENCOUNTER — Other Ambulatory Visit (HOSPITAL_COMMUNITY)
Admission: RE | Admit: 2021-08-01 | Discharge: 2021-08-01 | Disposition: A | Discharge status: Home / Self Care | Payer: Medicaid Other | Source: Other Acute Inpatient Hospital | Attending: Family Medicine | Admitting: Family Medicine

## 2021-08-01 DIAGNOSIS — Z5181 Encounter for therapeutic drug level monitoring: Secondary | ICD-10-CM | POA: Diagnosis not present

## 2021-08-01 DIAGNOSIS — Z79891 Long term (current) use of opiate analgesic: Secondary | ICD-10-CM | POA: Diagnosis not present

## 2021-08-01 LAB — RAPID URINE DRUG SCREEN, HOSP PERFORMED
Amphetamines: NOT DETECTED
Barbiturates: NOT DETECTED
Benzodiazepines: POSITIVE — AB
Cocaine: NOT DETECTED
Opiates: NOT DETECTED
Tetrahydrocannabinol: NOT DETECTED

## 2021-08-17 ENCOUNTER — Encounter: Payer: Self-pay | Admitting: Orthopaedic Surgery

## 2021-08-17 ENCOUNTER — Ambulatory Visit: Payer: Medicaid Other | Admitting: Orthopaedic Surgery

## 2021-08-17 DIAGNOSIS — M25511 Pain in right shoulder: Secondary | ICD-10-CM | POA: Diagnosis not present

## 2021-08-17 DIAGNOSIS — G8929 Other chronic pain: Secondary | ICD-10-CM | POA: Diagnosis not present

## 2021-08-17 DIAGNOSIS — M25512 Pain in left shoulder: Secondary | ICD-10-CM

## 2021-08-17 DIAGNOSIS — Z6841 Body Mass Index (BMI) 40.0 and over, adult: Secondary | ICD-10-CM

## 2021-08-17 NOTE — Progress Notes (Signed)
PROCEDURE NOTE: ? ?The patient request injection, verbal consent was obtained. ? ?The left shoulder was prepped appropriately after time out was performed.  ? ?Sterile technique was observed and injection of 1 cc of DepoMedrol 40mg  with several cc's of plain xylocaine. Anesthesia was provided by ethyl chloride and a 20-gauge needle was used to inject the shoulder area. A posterior approach was used.  The injection was tolerated well. ? ?A band aid dressing was applied. ? ?The patient was advised to apply ice later today and tomorrow to the injection sight as needed. ? ? ?PROCEDURE NOTE: ? ?The patient request injection, verbal consent was obtained. ? ?The right shoulder was prepped appropriately after time out was performed.  ? ?Sterile technique was observed and injection of 1 cc of DepoMedrol 40mg  with several cc's of plain xylocaine. Anesthesia was provided by ethyl chloride and a 20-gauge needle was used to inject the shoulder area. A posterior approach was used.  The injection was tolerated well. ? ?A band aid dressing was applied. ? ?The patient was advised to apply ice later today and tomorrow to the injection sight as needed. ? ? ?Encounter Diagnoses  ?Name Primary?  ? Chronic pain of both shoulders Yes  ? Body mass index 45.0-49.9, adult (HCC)   ? Morbid obesity (HCC)   ? ?Return prn. ? ?Call if any problem. ? ?Precautions discussed. ? ?Electronically Signed ? , MD ?5/4/20239:24 AM ?' ?

## 2021-11-22 ENCOUNTER — Encounter: Payer: Self-pay | Admitting: Orthopaedic Surgery

## 2021-11-22 ENCOUNTER — Ambulatory Visit: Payer: Medicaid Other | Admitting: Orthopaedic Surgery

## 2021-11-22 DIAGNOSIS — Z6841 Body Mass Index (BMI) 40.0 and over, adult: Secondary | ICD-10-CM

## 2021-11-22 DIAGNOSIS — M25512 Pain in left shoulder: Secondary | ICD-10-CM

## 2021-11-22 DIAGNOSIS — M25511 Pain in right shoulder: Secondary | ICD-10-CM

## 2021-11-22 DIAGNOSIS — G8929 Other chronic pain: Secondary | ICD-10-CM | POA: Diagnosis not present

## 2021-11-22 NOTE — Progress Notes (Signed)
PROCEDURE NOTE:  The patient request injection, verbal consent was obtained.  The left shoulder was prepped appropriately after time out was performed.   Sterile technique was observed and injection of 1 cc of DepoMedrol 40mg  with several cc's of plain xylocaine. Anesthesia was provided by ethyl chloride and a 20-gauge needle was used to inject the shoulder area. A posterior approach was used.  The injection was tolerated well.  A band aid dressing was applied.  The patient was advised to apply ice later today and tomorrow to the injection sight as needed.  PROCEDURE NOTE:  The patient request injection, verbal consent was obtained.  The right shoulder was prepped appropriately after time out was performed.   Sterile technique was observed and injection of 1 cc of DepoMedrol 40mg  with several cc's of plain xylocaine. Anesthesia was provided by ethyl chloride and a 20-gauge needle was used to inject the shoulder area. A posterior approach was used.  The injection was tolerated well.  A band aid dressing was applied.  The patient was advised to apply ice later today and tomorrow to the injection sight as needed.  Encounter Diagnoses  Name Primary?   Chronic pain of both shoulders Yes   Body mass index 45.0-49.9, adult (HCC)    Morbid obesity (HCC)    Return prn.  Call if any problem.  Precautions discussed.    Electronically Signed , MD 8/9/20238:39 AM

## 2021-12-07 ENCOUNTER — Ambulatory Visit (INDEPENDENT_AMBULATORY_CARE_PROVIDER_SITE_OTHER): Payer: Medicaid Other | Admitting: Podiatry

## 2021-12-07 ENCOUNTER — Encounter: Payer: Self-pay | Admitting: Podiatry

## 2021-12-07 ENCOUNTER — Ambulatory Visit (INDEPENDENT_AMBULATORY_CARE_PROVIDER_SITE_OTHER): Payer: Medicaid Other

## 2021-12-07 DIAGNOSIS — E1142 Type 2 diabetes mellitus with diabetic polyneuropathy: Secondary | ICD-10-CM

## 2021-12-07 DIAGNOSIS — S90811A Abrasion, right foot, initial encounter: Secondary | ICD-10-CM | POA: Diagnosis not present

## 2021-12-07 DIAGNOSIS — M79671 Pain in right foot: Secondary | ICD-10-CM

## 2021-12-07 DIAGNOSIS — M79672 Pain in left foot: Secondary | ICD-10-CM

## 2021-12-07 NOTE — Progress Notes (Signed)
  Subjective:  Patient ID: David Brooks, male    DOB: July 05, 1965,   MRN: [id number]  Chief Complaint  Patient presents with   Diabetes    Patient is a diabetic concerned  with black specs on feet    56 y.o. male presents for concern of "holes" on the bottom of both heels. Relates he was recently at the beach last week and had two areas pop up on the bottom of both heels and relates they were bleeding. Here with wife who states they were keeping ointment betadine and bandaid over area and keeping it clean. Relates they are doing much better but still wanted them checked. Relates burning and tingling in their feet. Patient is diabetic and last A1c was 8.9.   PCP:  Gareth Morgan, MD    . Denies any other pedal complaints. Denies n/v/f/c.   Past Medical History:  Diagnosis Date   Diabetes mellitus without complication (HCC)    Hypertension    Rotator cuff (capsule) sprain     Objective:  Physical Exam: Vascular: DP/PT pulses 2/4 bilateral. CFT <3 seconds. Absent hair growth on digits. Edema noted to bilateral lower extremities. Xerosis noted bilaterally.  Skin. No lacerations  or wounds noted bilateral feet. Nails 1-5 bilateral  are thickened discolored and elongated with subungual debris. Small pinpoint area of dried blood to plantar right heel. No underlying wound present.  Musculoskeletal: MMT 5/5 bilateral lower extremities in DF, PF, Inversion and Eversion. Deceased ROM in DF of ankle joint.  Neurological: Sensation intact to light touch. Protective sensation diminished bilateral.    Assessment:   1. Type 2 diabetes mellitus with diabetic polyneuropathy, unspecified whether long term insulin use (HCC)   2. Abrasion of right heel, initial encounter      Plan:  Patient was evaluated and treated and all questions answered. -Discussed and educated patient on diabetic foot care, especially with  regards to the vascular, neurological and musculoskeletal systems.  -Stressed the  importance of good glycemic control and the detriment of not  controlling glucose levels in relation to the foot. -Discussed supportive shoes at all times and checking feet regularly.  -No debridements necessary. No  open wounds.  -Answered all patient questions -Patient to return  in 1 year for DM foot check.  -Patient advised to call the office if any problems or questions arise in the meantime.   Louann Sjogren, DPM

## 2021-12-08 NOTE — Progress Notes (Signed)
Error

## 2022-02-27 ENCOUNTER — Encounter: Payer: Self-pay | Admitting: Orthopaedic Surgery

## 2022-02-27 ENCOUNTER — Ambulatory Visit: Payer: Medicaid Other | Admitting: Orthopaedic Surgery

## 2022-02-27 DIAGNOSIS — G8929 Other chronic pain: Secondary | ICD-10-CM

## 2022-02-27 DIAGNOSIS — M25512 Pain in left shoulder: Secondary | ICD-10-CM

## 2022-02-27 MED ORDER — METHYLPREDNISOLONE ACETATE 40 MG/ML IJ SUSP
40.0000 mg | Freq: Once | INTRAMUSCULAR | Status: AC
  Administered 2022-02-27: 40 mg via INTRA_ARTICULAR

## 2022-02-27 NOTE — Progress Notes (Signed)
PROCEDURE NOTE:  The patient request injection, verbal consent was obtained.  The left shoulder was prepped appropriately after time out was performed.   Sterile technique was observed and injection of 1 cc of DepoMedrol 40mg  with several cc's of plain xylocaine. Anesthesia was provided by ethyl chloride and a 20-gauge needle was used to inject the shoulder area. A posterior approach was used.  The injection was tolerated well.  A band aid dressing was applied.  The patient was advised to apply ice later today and tomorrow to the injection sight as needed.  Encounter Diagnosis  Name Primary?   Chronic left shoulder pain Yes   Return prn.  Call if any problem.  Precautions discussed.  Electronically Signed , MD 11/14/20233:15 PM

## 2022-05-08 ENCOUNTER — Ambulatory Visit: Payer: Medicaid Other | Admitting: Podiatry

## 2022-05-08 DIAGNOSIS — L97511 Non-pressure chronic ulcer of other part of right foot limited to breakdown of skin: Secondary | ICD-10-CM

## 2022-05-08 MED ORDER — MUPIROCIN 2 % EX OINT: 1.0000 | TOPICAL_OINTMENT | Freq: Two times a day (BID) | CUTANEOUS | 2 refills | Status: DC

## 2022-05-08 MED ORDER — AMMONIUM LACTATE 12 % EX CREA: 1.0000 | TOPICAL_CREAM | CUTANEOUS | 0 refills | Status: DC | PRN

## 2022-05-08 NOTE — Patient Instructions (Signed)
Use the antibiotic ointment (mupirocin) until healed then switch to the moisturizer.   Monitor for any signs/symptoms of infection. Call the office immediately if any occur or go directly to the emergency room. Call with any questions/concerns.

## 2022-05-10 NOTE — Progress Notes (Signed)
Subjective: Chief Complaint  Patient presents with   Wound Check    Pt states he has noticed small holes in the skin of both feet . He states there was some bleeding one time but since he has not noticed any other drainage. He states they have been there since 11/2021. He is concerned because he is diabetic    57 year old male presents as above concerns.  He has small ulcers on both of his feet.  This been ongoing for greater than 6 months.  He was last seen in the office in August of last year.  Symptoms symptoms have remained the same.  No new lesions he reports no drainage or pus otherwise.  No fevers or chills.  Objective: AAO x3, NAD DP/PT pulses palpable bilaterally, CRT less than 3 seconds On the medial aspect of the right heel there is 1 area of dry skin, dried blood.  Upon debridement there is punctate wound with minimal bleeding present.  There is no drainage or pus.  There is no malodor.  No probing, undermining or tunneling.  Dry skin is present. No pain with calf compression, swelling, warmth, erythema         Assessment: Superficial wound right heel, dry skin  Plan: -All treatment options discussed with the patient including all alternatives, risks, complications.  -Sharply debrided the area of the hyperkeratotic lesion on the right heel on the picture above and the minimal bleeding occurred to the area of skin breakdown.  Recommend antibiotic ointment dressing changes daily. -Discussed moisturizer for the skin -Monitor for any clinical signs or symptoms of infection and directed to call the office immediately should any occur or go to the ER. -Patient encouraged to call the office with any questions, concerns, change in symptoms.   Trula Slade DPM

## 2022-05-29 ENCOUNTER — Ambulatory Visit (INDEPENDENT_AMBULATORY_CARE_PROVIDER_SITE_OTHER): Payer: Medicaid Other | Admitting: Orthopaedic Surgery

## 2022-05-29 ENCOUNTER — Encounter: Payer: Self-pay | Admitting: Orthopaedic Surgery

## 2022-05-29 VITALS — BP 160/104 | HR 87 | Ht 71.0 in | Wt 318.0 lb

## 2022-05-29 DIAGNOSIS — M25511 Pain in right shoulder: Secondary | ICD-10-CM

## 2022-05-29 DIAGNOSIS — G8929 Other chronic pain: Secondary | ICD-10-CM

## 2022-05-29 DIAGNOSIS — M25512 Pain in left shoulder: Secondary | ICD-10-CM | POA: Diagnosis not present

## 2022-05-29 DIAGNOSIS — Z6841 Body Mass Index (BMI) 40.0 and over, adult: Secondary | ICD-10-CM

## 2022-05-29 NOTE — Progress Notes (Signed)
PROCEDURE NOTE:  The patient request injection, verbal consent was obtained.  The left shoulder was prepped appropriately after time out was performed.   Sterile technique was observed and injection of 1 cc of DepoMedrol 63m with several cc's of plain xylocaine. Anesthesia was provided by ethyl chloride and a 20-gauge needle was used to inject the shoulder area. A posterior approach was used.  The injection was tolerated well.  A band aid dressing was applied.  The patient was advised to apply ice later today and tomorrow to the injection sight as needed.   PROCEDURE NOTE:  The patient request injection, verbal consent was obtained.  The right shoulder was prepped appropriately after time out was performed.   Sterile technique was observed and injection of 1 cc of DepoMedrol 419mwith several cc's of plain xylocaine. Anesthesia was provided by ethyl chloride and a 20-gauge needle was used to inject the shoulder area. A posterior approach was used.  The injection was tolerated well.  A band aid dressing was applied.  The patient was advised to apply ice later today and tomorrow to the injection sight as needed.   Encounter Diagnoses  Name Primary?   Chronic pain of both shoulders Yes   Body mass index 45.0-49.9, adult (HCC)    Morbid obesity (HCC)    Return prn.  He fell thru floor and caught himself with arms.  His shoulders are more painful because of this.  Call if any problem.  Precautions discussed.  Electronically Signed WaSanjuana KavaMD 2/13/20248:54 AM

## 2022-06-14 ENCOUNTER — Encounter: Payer: Self-pay | Admitting: Radiology

## 2022-08-07 ENCOUNTER — Ambulatory Visit: Payer: Medicaid Other | Admitting: Podiatry

## 2022-08-07 DIAGNOSIS — D492 Neoplasm of unspecified behavior of bone, soft tissue, and skin: Secondary | ICD-10-CM

## 2022-08-07 DIAGNOSIS — E119 Type 2 diabetes mellitus without complications: Secondary | ICD-10-CM | POA: Diagnosis not present

## 2022-08-07 DIAGNOSIS — M79672 Pain in left foot: Secondary | ICD-10-CM | POA: Diagnosis not present

## 2022-08-07 DIAGNOSIS — B351 Tinea unguium: Secondary | ICD-10-CM | POA: Diagnosis not present

## 2022-08-07 DIAGNOSIS — M79671 Pain in right foot: Secondary | ICD-10-CM | POA: Diagnosis not present

## 2022-08-07 DIAGNOSIS — E1142 Type 2 diabetes mellitus with diabetic polyneuropathy: Secondary | ICD-10-CM

## 2022-08-07 NOTE — Progress Notes (Signed)
Subjective: Chief Complaint  Patient presents with   Nail Problem    Thick painful toenails, 3 month follow up   Callouses    57 year old male presents as above concerns.  He states that he gets ingrown toenails in shoes to cut out himself.  Most recently he had on the right foot he cut out.  He also gets calluses on the bottom of his feet.  He does try to keep moisturizer on them.  Also he does report the neuropathy seems to be getting worse and his duloxetine is recently been increased about 2 weeks ago.  Patient reports some low back pain as well.  Objective: AAO x3, NAD DP/PT pulses palpable bilaterally, CRT less than 3 seconds Nails are hypertrophic, dystrophic, brittle, discolored, elongated 10.  Incurvation present most of the right hallux toenail with some dried blood present on the lateral nail border we cut himself.  There is no edema, erythema, drainage or pus there is no infection.  Incurvation also present fill the lesser digit nails.   Minimal hyperkeratotic lesions present bilaterally without any underlying ulceration drainage or signs of infection. No pain with calf compression, swelling, warmth, erythema   Assessment: Symptomatic onychomycosis, dry skin/callus, neuropathy  Plan: -All treatment options discussed with the patient including all alternatives, risks, complications.  -Sharp debridement of nails x 10 without any complications of bleeding.  In particular I was able to debride the symptomatic borders of the ingrown toenail and complications or bleeding. -Discussed moisturizer for the skin-as a courtesy debrided the callus without complications or bleeding -He recently says duloxetine dose increase.  Will continue with this.  We discussed possible neurology evaluation given worsening neuropathy and also started to have low back pain as well. -Monitor for any clinical signs or symptoms of infection and directed to call the office immediately should any occur or go to  the ER. -Patient encouraged to call the office with any questions, concerns, change in symptoms.   Vivi Barrack DPM

## 2022-09-11 ENCOUNTER — Ambulatory Visit: Payer: Medicaid Other | Admitting: Orthopaedic Surgery

## 2022-09-11 ENCOUNTER — Encounter: Payer: Self-pay | Admitting: Orthopaedic Surgery

## 2022-09-11 DIAGNOSIS — Z6841 Body Mass Index (BMI) 40.0 and over, adult: Secondary | ICD-10-CM

## 2022-09-11 DIAGNOSIS — M25512 Pain in left shoulder: Secondary | ICD-10-CM | POA: Diagnosis not present

## 2022-09-11 DIAGNOSIS — M25511 Pain in right shoulder: Secondary | ICD-10-CM

## 2022-09-11 DIAGNOSIS — G8929 Other chronic pain: Secondary | ICD-10-CM

## 2022-09-11 MED ORDER — METHYLPREDNISOLONE ACETATE 40 MG/ML IJ SUSP: 40.0000 mg | Freq: Once | INTRAMUSCULAR | Status: DC

## 2022-09-11 NOTE — Progress Notes (Signed)
PROCEDURE NOTE:  The patient request injection, verbal consent was obtained.  The right shoulder was prepped appropriately after time out was performed.   Sterile technique was observed and injection of 1 cc of DepoMedrol 40mg  with several cc's of plain xylocaine. Anesthesia was provided by ethyl chloride and a 20-gauge needle was used to inject the shoulder area. A posterior approach was used.  The injection was tolerated well.  A band aid dressing was applied.  The patient was advised to apply ice later today and tomorrow to the injection sight as needed.  PROCEDURE NOTE:  The patient request injection, verbal consent was obtained.  The left shoulder was prepped appropriately after time out was performed.   Sterile technique was observed and injection of 1 cc of DepoMedrol 40mg  with several cc's of plain xylocaine. Anesthesia was provided by ethyl chloride and a 20-gauge needle was used to inject the shoulder area. A posterior approach was used.  The injection was tolerated well.  A band aid dressing was applied.  The patient was advised to apply ice later today and tomorrow to the injection sight as needed.  Encounter Diagnoses  Name Primary?   Chronic pain of both shoulders Yes   Body mass index 45.0-49.9, adult (HCC)    Morbid obesity (HCC)    Return prn.  Call if any problem.  Precautions discussed.  Electronically Signed Darreld Mclean, MD 5/28/20248:16 AM

## 2022-10-02 ENCOUNTER — Encounter: Payer: Self-pay | Admitting: Orthopaedic Surgery

## 2022-10-02 ENCOUNTER — Ambulatory Visit: Payer: Medicaid Other | Admitting: Orthopaedic Surgery

## 2022-10-02 ENCOUNTER — Other Ambulatory Visit (INDEPENDENT_AMBULATORY_CARE_PROVIDER_SITE_OTHER): Payer: Medicaid Other

## 2022-10-02 VITALS — BP 181/114 | HR 95 | Ht 71.0 in | Wt 306.0 lb

## 2022-10-02 DIAGNOSIS — W19XXXA Unspecified fall, initial encounter: Secondary | ICD-10-CM

## 2022-10-02 DIAGNOSIS — G8929 Other chronic pain: Secondary | ICD-10-CM

## 2022-10-02 DIAGNOSIS — M25511 Pain in right shoulder: Secondary | ICD-10-CM

## 2022-10-02 NOTE — Patient Instructions (Signed)
While we are working on your approval for MRI please go ahead and call to schedule your appointment  [facility]  within at least one (1) week.   [facility] [phone]

## 2022-10-02 NOTE — Progress Notes (Addendum)
I fell  He fell about two weeks ago on his right shoulder.  He has chronic pain of the shoulder and I saw him for this about a month ago.  He had pain in the shoulder and felt a pop.  It has not improved over the last two weeks.  He cannot raise it over head.  He has no paresthesias.  He has no redness.  Examination of Right Upper Extremity is done.  Inspection:   Overall:  Elbow non-tender without crepitus or defects, forearm non-tender without crepitus or defects, wrist non-tender without crepitus or defects, hand non-tender.    Shoulder: with glenohumeral joint tenderness, without effusion.   Upper arm:  without swelling and tenderness   Range of motion:   Overall:  Full range of motion of the elbow, full range of motion of wrist and full range of motion in fingers.   Shoulder:  right  90 degrees forward flexion; 80 degrees abduction; 20 degrees internal rotation, 15 degrees external rotation, 5 degrees extension, 40 degrees adduction.   Stability:   External rotation lag sign was positive.  Belly press or belly off test was positive on the right shoulder   Strength and Tone:   Overall full shoulder muscles strength, full upper arm strength and normal upper arm bulk and tone.   X-rays were done of the right shoulder, reported separately.  Encounter Diagnosis  Name Primary?   Chronic pain in right shoulder Yes   PROCEDURE NOTE:  The patient request injection, verbal consent was obtained.  The right shoulder was prepped appropriately after time out was performed.   Sterile technique was observed and injection of 1 cc of DepoMedrol 40mg  with several cc's of plain xylocaine. Anesthesia was provided by ethyl chloride and a 20-gauge needle was used to inject the shoulder area. A posterior approach was used.  The injection was tolerated well.  A band aid dressing was applied.  The patient was advised to apply ice later today and tomorrow to the injection sight as needed.  I will  get MRI of the right shoulder.  Return in three weeks.  Call if any problem.  Precautions discussed.  Electronically Signed Darreld Mclean, MD 6/18/202410:10 AM

## 2022-10-08 ENCOUNTER — Telehealth: Payer: Self-pay

## 2022-10-08 NOTE — Telephone Encounter (Signed)
Patient called stating that they do not have an order for his MRI at [facility] or [facility].  He is wanting to know what is the problem, he wants to hurry up and get it done to find out what is the problem.  He is asking for a return call at [phone].

## 2022-10-19 ENCOUNTER — Telehealth: Payer: Self-pay | Admitting: Orthopaedic Surgery

## 2022-10-19 NOTE — Telephone Encounter (Signed)
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  He stated he didn't know he was in for a sleep study, he's supposed to be getting a MRI setup.  He would like a call back.

## 2022-10-22 NOTE — Telephone Encounter (Signed)
I called patient discussed, we did not order sleep study.  MRI Berkley Harvey being worked on.

## 2022-10-26 ENCOUNTER — Other Ambulatory Visit: Payer: Self-pay

## 2022-11-13 ENCOUNTER — Ambulatory Visit: Payer: Medicaid Other | Admitting: Orthopaedic Surgery

## 2022-11-13 ENCOUNTER — Encounter: Payer: Self-pay | Admitting: Orthopaedic Surgery

## 2022-11-13 DIAGNOSIS — M75121 Complete rotator cuff tear or rupture of right shoulder, not specified as traumatic: Secondary | ICD-10-CM | POA: Diagnosis not present

## 2022-11-13 DIAGNOSIS — Z6841 Body Mass Index (BMI) 40.0 and over, adult: Secondary | ICD-10-CM | POA: Diagnosis not present

## 2022-11-13 DIAGNOSIS — M25511 Pain in right shoulder: Secondary | ICD-10-CM | POA: Diagnosis not present

## 2022-11-13 DIAGNOSIS — G8929 Other chronic pain: Secondary | ICD-10-CM

## 2022-11-13 NOTE — Progress Notes (Signed)
My shoulder hurts.  He had MRI at [facility] and it showed: MPRESSION:  1. Large full-thickness retracted tear of the rotator cuff (supraspinatus and anterior infraspinatus tendons). 2. 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 3. Glenohumeral joint effusion, in communication with the subacromial/subdeltoid bursa. 4. Intratendinous signal in the biceps tendon long head, which may be attributed to tendinosis or partial tear. 5. Equivocal superior labral signal, suggesting degeneration or possible SLAP tear.   He read the report before coming in.  I have explained it.  He prefers NOT to have any surgery now.  He says he will put up with it for a while longer.  Dr. Sudie Bailey is fully retiring and the patient will need to find a family doctor to get his medicines.  I will give the narcotic, Percocet 10.  His right shoulder is tender today with decreased motion.  NV intact.  Encounter Diagnoses  Name Primary?   Chronic pain in right shoulder Yes   Nontraumatic complete tear of right rotator cuff    Body mass index 45.0-49.9, adult (HCC)    Morbid obesity (HCC)    Return in two months. Call if any problem.  Precautions discussed.  Electronically Signed Darreld Mclean, MD 7/30/20242:06 PM

## 2022-11-13 NOTE — Progress Notes (Signed)
[facility] Outside Information   MRI Shoulder Right WO Contrast  Anatomical Region Laterality Modality  Shoulder -- Magnetic Resonance  Chest -- --  Arm -- --   Impression  IMPRESSION:  1. Large full-thickness retracted tear of the rotator cuff (supraspinatus and anterior infraspinatus tendons). 2. 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 3. Glenohumeral joint effusion, in communication with the subacromial/subdeltoid bursa. 4. Intratendinous signal in the biceps tendon long head, which may be attributed to tendinosis or partial tear. 5. Equivocal superior labral signal, suggesting degeneration or possible SLAP tear.  Electronically Signed by: Barrett Henle, MD on 11/08/2022 4:40 PM Narrative  HISTORY:  Right shoulder pain, unspecified chronicity  TECHNIQUE:  MRI of the right shoulder without contrast. Images are degraded by motion.  COMPARISON: None.  FINDINGS: Bones: No acute fracture or osteonecrosis. Acromioclavicular joint: Moderate arthrosis, inferiorly projecting osteophytes. Subacromial spurring. Subacromial/Subdeltoid bursa:  Small/moderate volume of bursal fluid. Glenohumeral joint: Small joint effusion. Biceps tendon: Tendon is anatomically located. There is intratendinous signal within the proximal bicipital groove. Labroligamentous complex: Limited evaluation of the labrum without intra-articular contrast.  Equivocal signal in the superior labrum, series 8 image 9. Rotator cuff:   -Subscapularis tendon intact.. -Large full-thickness tear of the rotator cuff measuring 3 cm anterior-posterior. Tear involves the full width of the supraspinatus tendon, as well as the anterior infraspinatus tendon. There are posterior infraspinatus tendon fibers that remain intact. Tendon retraction up to 4.3 cm. Low-grade supraspinatus and infraspinatus muscle edema. No significant muscle atrophy. Other: None. Procedure Note  Barrett Henle, MD -  11/08/2022 Formatting of this note might be different from the original. HISTORY:  Right shoulder pain, unspecified chronicity  TECHNIQUE:  MRI of the right shoulder without contrast. Images are degraded by motion.  COMPARISON: None.  FINDINGS: Bones: No acute fracture or osteonecrosis. Acromioclavicular joint: Moderate arthrosis, inferiorly projecting osteophytes. Subacromial spurring. Subacromial/Subdeltoid bursa:  Small/moderate volume of bursal fluid. Glenohumeral joint: Small joint effusion. Biceps tendon: Tendon is anatomically located. There is intratendinous signal within the proximal bicipital groove. Labroligamentous complex: Limited evaluation of the labrum without intra-articular contrast.  Equivocal signal in the superior labrum, series 8 image 9. Rotator cuff:   -Subscapularis tendon intact.. -Large full-thickness tear of the rotator cuff measuring 3 cm anterior-posterior. Tear involves the full width of the supraspinatus tendon, as well as the anterior infraspinatus tendon. There are posterior infraspinatus tendon fibers that remain intact. Tendon retraction up to 4.3 cm. Low-grade supraspinatus and infraspinatus muscle edema. No significant muscle atrophy. Other: None.   IMPRESSION:  1. Large full-thickness retracted tear of the rotator cuff (supraspinatus and anterior infraspinatus tendons). 2. 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 3. Glenohumeral joint effusion, in communication with the subacromial/subdeltoid bursa. 4. Intratendinous signal in the biceps tendon long head, which may be attributed to tendinosis or partial tear. 5. Equivocal superior labral signal, suggesting degeneration or possible SLAP tear.  Electronically Signed by: Barrett Henle, MD on 11/08/2022 4:40 PM Resulting Agency Comment

## 2022-11-14 ENCOUNTER — Ambulatory Visit: Payer: Medicaid Other | Admitting: Orthopaedic Surgery

## 2022-12-18 ENCOUNTER — Ambulatory Visit: Payer: Medicaid Other | Admitting: Orthopaedic Surgery

## 2022-12-18 ENCOUNTER — Encounter: Payer: Self-pay | Admitting: Orthopaedic Surgery

## 2022-12-18 DIAGNOSIS — M25511 Pain in right shoulder: Secondary | ICD-10-CM | POA: Diagnosis not present

## 2022-12-18 DIAGNOSIS — Z6841 Body Mass Index (BMI) 40.0 and over, adult: Secondary | ICD-10-CM

## 2022-12-18 DIAGNOSIS — G8929 Other chronic pain: Secondary | ICD-10-CM | POA: Diagnosis not present

## 2022-12-18 DIAGNOSIS — M25512 Pain in left shoulder: Secondary | ICD-10-CM | POA: Diagnosis not present

## 2022-12-18 MED ORDER — METHYLPREDNISOLONE ACETATE 40 MG/ML IJ SUSP
40.0000 mg | Freq: Once | INTRAMUSCULAR | Status: AC
Start: 2022-12-18 — End: 2022-12-18
  Administered 2022-12-18: 40 mg via INTRA_ARTICULAR

## 2022-12-18 NOTE — Progress Notes (Signed)
PROCEDURE NOTE:  The patient request injection, verbal consent was obtained.  The right shoulder was prepped appropriately after time out was performed.   Sterile technique was observed and injection of 1 cc of DepoMedrol 40mg  with several cc's of plain xylocaine. Anesthesia was provided by ethyl chloride and a 20-gauge needle was used to inject the shoulder area. A posterior approach was used.  The injection was tolerated well.  A band aid dressing was applied.  The patient was advised to apply ice later today and tomorrow to the injection sight as needed.   PROCEDURE NOTE:  The patient request injection, verbal consent was obtained.  The left shoulder was prepped appropriately after time out was performed.   Sterile technique was observed and injection of 1 cc of DepoMedrol 40mg  with several cc's of plain xylocaine. Anesthesia was provided by ethyl chloride and a 20-gauge needle was used to inject the shoulder area. A posterior approach was used.  The injection was tolerated well.  A band aid dressing was applied.  The patient was advised to apply ice later today and tomorrow to the injection sight as needed.   Encounter Diagnoses  Name Primary?   Chronic pain of both shoulders Yes   Morbid obesity (HCC)    Body mass index 45.0-49.9, adult (HCC)    Return prn.  Call if any problem.  Precautions discussed.  Electronically Signed Darreld Mclean, MD 9/3/202410:13 AM

## 2022-12-22 ENCOUNTER — Encounter: Payer: Self-pay | Admitting: Orthopaedic Surgery

## 2022-12-24 ENCOUNTER — Telehealth: Payer: Self-pay

## 2022-12-24 NOTE — Telephone Encounter (Signed)
Patient called stating that Dr. Sudie Bailey use to do his Oxycodone and now since he has retired the pharmacy needs a new script and PA. Patient is asking if Dr. Hilda Lias will do this for him? Stated that they gave him a 5 day supply to help.   Oxycodone-Acetaminophen  10/325 MG   PATIENT USES [facility]

## 2022-12-26 NOTE — Telephone Encounter (Signed)
Patient called, his oxycodone Rx needs to be sent in again.  The previous one "expired" per pharmacy, as far as getting the auth and all.  If you can resend, I will start the auth for him and hopefully get it squared away.  Please message me back once you have sent in Rx?  Of course let me know as well if that is not the route you go, thanks!

## 2022-12-27 MED ORDER — OXYCODONE-ACETAMINOPHEN 10-325 MG PO TABS: 1.0000 | ORAL_TABLET | ORAL | 0 refills | Status: DC | PRN

## 2022-12-31 ENCOUNTER — Telehealth: Payer: Self-pay | Admitting: Orthopaedic Surgery

## 2022-12-31 NOTE — Telephone Encounter (Signed)
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 Sudie Bailey use to give him 180 and Dr. Hilda Lias only gave him 30 tablets.  He would like a call back.

## 2023-01-01 MED ORDER — OXYCODONE-ACETAMINOPHEN 10-325 MG PO TABS: 1.0000 | ORAL_TABLET | ORAL | 0 refills | Status: DC | PRN

## 2023-01-01 NOTE — Telephone Encounter (Signed)
Called patient and explained to him that Dr Hilda Lias is not a PCP nor Pain management and can only call in 5 day prescription at a time

## 2023-01-02 ENCOUNTER — Telehealth: Payer: Self-pay

## 2023-01-02 NOTE — Telephone Encounter (Signed)
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  Please call the patient [phone].

## 2023-01-02 NOTE — Telephone Encounter (Signed)
I completed a prior authorization request, it will be denied. Patient will get a copy in the mail

## 2023-01-02 NOTE — Telephone Encounter (Signed)
Patient says it is [facility] telling him to get an Authorization for his pain medication. I told him he needs a PCP he said he spoke to a couple this morning and he understands we can only give him 5 days

## 2023-01-08 ENCOUNTER — Telehealth: Payer: Self-pay | Admitting: Orthopaedic Surgery

## 2023-01-08 ENCOUNTER — Ambulatory Visit: Payer: Medicaid Other | Admitting: Orthopaedic Surgery

## 2023-01-08 NOTE — Telephone Encounter (Signed)
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  He also wants to know what he's supposed to do when his next refill will be on a Saturday.

## 2023-01-09 NOTE — Telephone Encounter (Signed)
I called him back told him as Dr Hilda Lias told him previously he can write the meds but not as Dr Sudie Bailey did and Dr Hilda Lias will be back on Tuesday

## 2023-01-09 NOTE — Telephone Encounter (Signed)
DR.  Hilda Lias   Patient called questioning his pain medicine. He has called SEVERAL times about his medicine and no one will help him here.   Please call him back at [phone]

## 2023-01-09 NOTE — Telephone Encounter (Signed)
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

## 2023-01-09 NOTE — Telephone Encounter (Signed)
I dont see the request from yesterday once it was sent to [facility], she is in [place] today. I am forwarding now to D rKeeling if he is due on a Saturday he will have to wait until the following Monday or Tuesday depending on when Dr Hilda Lias can send in.   To Dr Kirtland Bouchard.  Dr Kirtland Bouchard discussed with patient in the office, told him he can prescribe but he can't do it like the primary care did\  Also, I am confused, he wrote for 180 last week is requesting more.   To Dr Kirtland Bouchard he may have already discussed with Tori too.

## 2023-01-11 MED ORDER — OXYCODONE-ACETAMINOPHEN 10-325 MG PO TABS: 1.0000 | ORAL_TABLET | ORAL | 0 refills | Status: DC | PRN

## 2023-01-14 ENCOUNTER — Telehealth: Payer: Self-pay | Admitting: Orthopaedic Surgery

## 2023-01-14 NOTE — Telephone Encounter (Signed)
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

## 2023-01-15 ENCOUNTER — Telehealth: Payer: Self-pay | Admitting: Radiology

## 2023-01-15 MED ORDER — OXYCODONE-ACETAMINOPHEN 10-325 MG PO TABS: 1.0000 | ORAL_TABLET | ORAL | 0 refills | Status: AC | PRN

## 2023-01-15 NOTE — Telephone Encounter (Signed)
Submitted another authorization request for cover my meds.

## 2023-01-16 NOTE — Telephone Encounter (Signed)
Patient another message regarding the prior auth.  I called the patient and advised that Amy submitted another request yesterday.  He voiced understanding.

## 2023-01-22 ENCOUNTER — Telehealth: Payer: Self-pay | Admitting: Orthopaedic Surgery

## 2023-01-22 NOTE — Telephone Encounter (Signed)
Called patient to let him know about his denial left voice mail that it is the insurance that is denying him that I will try to help him and I can't make any promises and he would get a letter in the mail

## 2023-01-22 NOTE — Telephone Encounter (Signed)
I put the denial on your keyboard I already told him they denied He is still asking He should get a copy He is asking again.

## 2023-01-22 NOTE — Telephone Encounter (Signed)
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

## 2023-01-23 ENCOUNTER — Telehealth: Payer: Self-pay

## 2023-01-23 NOTE — Telephone Encounter (Signed)
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  He wants a call back today.

## 2023-01-23 NOTE — Telephone Encounter (Signed)
Complete

## 2023-01-23 NOTE — Telephone Encounter (Signed)
Called patient and spoke to him and told him that we have sent PA's in multiple times for his mediation he said it is [facility] that keeps sending the script over and that they told him today that he can pay cash for his meds and he said that he will pay cash for the medication because he can not work without his medication and he thanked [place] for all we have done to try to help him

## 2023-02-21 ENCOUNTER — Telehealth: Payer: Self-pay

## 2023-02-22 ENCOUNTER — Telehealth: Payer: Self-pay | Admitting: Orthopaedic Surgery

## 2023-02-22 NOTE — Telephone Encounter (Signed)
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  He asked don't we close early today, he said that is why he's calling back bc his medication is due to be filled today.  [phone]

## 2023-02-22 NOTE — Telephone Encounter (Signed)
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  [phone]

## 2023-02-25 ENCOUNTER — Telehealth: Payer: Self-pay

## 2023-02-25 NOTE — Telephone Encounter (Signed)
Patient called to check status of the PA of his pain medication. He wanted to let us know that he paid for a 5 day supply since he was completely out.  Please call him and advise

## 2023-02-26 ENCOUNTER — Telehealth: Payer: Self-pay | Admitting: Orthopaedic Surgery

## 2023-02-26 NOTE — Telephone Encounter (Signed)
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  [phone]

## 2023-02-27 ENCOUNTER — Telehealth: Payer: Self-pay

## 2023-02-27 MED ORDER — OXYCODONE-ACETAMINOPHEN 10-325 MG PO TABS: 1.0000 | ORAL_TABLET | ORAL | 0 refills | Status: DC | PRN

## 2023-02-27 NOTE — Telephone Encounter (Signed)
Spoke to patient and let him know he was denied again and we sent his script in today and he will pay cash for his scripts

## 2023-02-28 ENCOUNTER — Encounter: Payer: Self-pay | Admitting: Orthopaedic Surgery

## 2023-02-28 ENCOUNTER — Ambulatory Visit: Payer: Medicaid Other | Admitting: Orthopaedic Surgery

## 2023-02-28 VITALS — BP 169/103 | HR 67 | Ht 71.0 in | Wt 296.0 lb

## 2023-02-28 DIAGNOSIS — G8929 Other chronic pain: Secondary | ICD-10-CM

## 2023-02-28 DIAGNOSIS — M25511 Pain in right shoulder: Secondary | ICD-10-CM

## 2023-02-28 DIAGNOSIS — Z6841 Body Mass Index (BMI) 40.0 and over, adult: Secondary | ICD-10-CM

## 2023-02-28 DIAGNOSIS — M25512 Pain in left shoulder: Secondary | ICD-10-CM

## 2023-02-28 NOTE — Telephone Encounter (Signed)
Complete

## 2023-02-28 NOTE — Progress Notes (Signed)
PROCEDURE NOTE:  The patient request injection, verbal consent was obtained.  The right shoulder was prepped appropriately after time out was performed.   Sterile technique was observed and injection of 1 cc of DepoMedrol 40mg  with several cc's of plain xylocaine. Anesthesia was provided by ethyl chloride and a 20-gauge needle was used to inject the shoulder area. A posterior approach was used.  The injection was tolerated well.  A band aid dressing was applied.  The patient was advised to apply ice later today and tomorrow to the injection sight as needed.  PROCEDURE NOTE:  The patient request injection, verbal consent was obtained.  The left shoulder was prepped appropriately after time out was performed.   Sterile technique was observed and injection of 1 cc of DepoMedrol 40mg  with several cc's of plain xylocaine. Anesthesia was provided by ethyl chloride and a 20-gauge needle was used to inject the shoulder area. A posterior approach was used.  The injection was tolerated well.  A band aid dressing was applied.  The patient was advised to apply ice later today and tomorrow to the injection sight as needed.  Encounter Diagnoses  Name Primary?   Chronic pain of both shoulders Yes   Morbid obesity (HCC)    Body mass index 45.0-49.9, adult (HCC)    Return 3 months.  Call if any problem.  Precautions discussed.  Electronically Signed Darreld Mclean, MD 11/14/20248:38 AM

## 2023-02-28 NOTE — Telephone Encounter (Signed)
done

## 2023-03-04 DIAGNOSIS — G8929 Other chronic pain: Secondary | ICD-10-CM | POA: Diagnosis not present

## 2023-03-04 DIAGNOSIS — M25512 Pain in left shoulder: Secondary | ICD-10-CM | POA: Diagnosis not present

## 2023-03-04 DIAGNOSIS — M25511 Pain in right shoulder: Secondary | ICD-10-CM | POA: Diagnosis not present

## 2023-03-04 MED ORDER — METHYLPREDNISOLONE ACETATE 40 MG/ML IJ SUSP
40.0000 mg | Freq: Once | INTRAMUSCULAR | Status: AC
Start: 2023-03-04 — End: 2023-03-04
  Administered 2023-03-04: 40 mg via INTRA_ARTICULAR

## 2023-03-04 MED ORDER — METHYLPREDNISOLONE ACETATE 40 MG/ML IJ SUSP
40.0000 mg | Freq: Once | INTRAMUSCULAR | Status: AC
  Administered 2023-03-04: 40 mg via INTRA_ARTICULAR

## 2023-03-04 NOTE — Addendum Note (Signed)
Addended by: Michaele Offer on: 03/04/2023 02:02 PM   Modules accepted: Orders

## 2023-03-07 ENCOUNTER — Ambulatory Visit (INDEPENDENT_AMBULATORY_CARE_PROVIDER_SITE_OTHER): Payer: Medicaid Other | Admitting: Podiatry

## 2023-03-07 ENCOUNTER — Encounter: Payer: Self-pay | Admitting: Podiatry

## 2023-03-07 DIAGNOSIS — D492 Neoplasm of unspecified behavior of bone, soft tissue, and skin: Secondary | ICD-10-CM

## 2023-03-07 NOTE — Progress Notes (Signed)
Subjective:   Patient ID: David Brooks, male   DOB: 57 y.o.   MRN: [id number]   HPI Patient presents concerned about lesions on the bottom of both feet and discoloration with patient being diabetes and diabetic and is in much better control and has lost weight but wants it looked at   ROS      Objective:  Physical Exam  Neurovascular status intact with several small lesions plantar aspect both feet along with some discoloration but no indications of erythema edema or drainage     Assessment:  Probability for benign neoplasms bilateral     Plan:  H&P reviewed did slight debridement but do not recommend active treatment and if symptoms were to get worse may require excision or other treatment plan

## 2023-03-29 ENCOUNTER — Telehealth: Payer: Self-pay | Admitting: Orthopaedic Surgery

## 2023-03-29 NOTE — Telephone Encounter (Signed)
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

## 2023-03-30 MED ORDER — OXYCODONE-ACETAMINOPHEN 10-325 MG PO TABS: 1.0000 | ORAL_TABLET | ORAL | 0 refills | Status: DC | PRN

## 2023-04-23 ENCOUNTER — Telehealth: Payer: Self-pay | Admitting: Orthopaedic Surgery

## 2023-04-23 NOTE — Telephone Encounter (Signed)
 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  He'd like it sent to [facility] on [address].

## 2023-04-24 MED ORDER — OXYCODONE-ACETAMINOPHEN 10-325 MG PO TABS: 1.0000 | ORAL_TABLET | ORAL | 0 refills | Status: DC | PRN

## 2023-04-30 ENCOUNTER — Telehealth: Payer: Self-pay

## 2023-04-30 MED ORDER — OXYCODONE-ACETAMINOPHEN 10-325 MG PO TABS: 1.0000 | ORAL_TABLET | ORAL | 0 refills | Status: DC | PRN

## 2023-04-30 NOTE — Telephone Encounter (Signed)
 Patient called stating that the [facility] [address] only gave him 79 tablets because that is all they had. They told him to let you know so you could do a new prescription for the remainer.    Oxycodone -Acetaminophen  10/325 MG  Qty 101 Tablets  [facility] [address]

## 2023-05-05 ENCOUNTER — Emergency Department (HOSPITAL_COMMUNITY)
Admission: EM | Admit: 2023-05-05 | Discharge: 2023-05-05 | Disposition: A | Discharge status: Home / Self Care | Payer: Medicaid Other | Attending: Emergency Medicine | Admitting: Emergency Medicine

## 2023-05-05 ENCOUNTER — Encounter (HOSPITAL_COMMUNITY): Payer: Self-pay

## 2023-05-05 ENCOUNTER — Other Ambulatory Visit: Payer: Self-pay

## 2023-05-05 DIAGNOSIS — Z7982 Long term (current) use of aspirin: Secondary | ICD-10-CM | POA: Insufficient documentation

## 2023-05-05 DIAGNOSIS — I10 Essential (primary) hypertension: Secondary | ICD-10-CM | POA: Diagnosis not present

## 2023-05-05 DIAGNOSIS — Z7984 Long term (current) use of oral hypoglycemic drugs: Secondary | ICD-10-CM | POA: Insufficient documentation

## 2023-05-05 DIAGNOSIS — E119 Type 2 diabetes mellitus without complications: Secondary | ICD-10-CM | POA: Diagnosis not present

## 2023-05-05 DIAGNOSIS — R509 Fever, unspecified: Secondary | ICD-10-CM | POA: Diagnosis present

## 2023-05-05 DIAGNOSIS — R066 Hiccough: Secondary | ICD-10-CM | POA: Insufficient documentation

## 2023-05-05 DIAGNOSIS — R000 Tachycardia, unspecified: Secondary | ICD-10-CM | POA: Insufficient documentation

## 2023-05-05 DIAGNOSIS — U071 COVID-19: Secondary | ICD-10-CM | POA: Diagnosis not present

## 2023-05-05 DIAGNOSIS — Z79899 Other long term (current) drug therapy: Secondary | ICD-10-CM | POA: Diagnosis not present

## 2023-05-05 LAB — RESP PANEL BY RT-PCR (RSV, FLU A&B, COVID)  RVPGX2
Influenza A by PCR: NEGATIVE
Influenza B by PCR: NEGATIVE
Resp Syncytial Virus by PCR: NEGATIVE
SARS Coronavirus 2 by RT PCR: POSITIVE — AB

## 2023-05-05 MED ORDER — PAXLOVID (300/100) 20 X 150 MG & 10 X 100MG PO TBPK: 3.0000 | ORAL_TABLET | Freq: Two times a day (BID) | ORAL | 0 refills | Status: AC

## 2023-05-05 MED ORDER — NAPROXEN 500 MG PO TABS: 500.0000 mg | ORAL_TABLET | Freq: Two times a day (BID) | ORAL | 0 refills | Status: DC

## 2023-05-05 MED ORDER — CHLORPROMAZINE HCL 10 MG PO TABS
10.0000 mg | ORAL_TABLET | Freq: Once | ORAL | Status: DC
  Filled 2023-05-05: qty 1

## 2023-05-05 MED ORDER — CHLORPROMAZINE HCL 25 MG PO TABS
12.5000 mg | ORAL_TABLET | Freq: Once | ORAL | Status: AC
Start: 2023-05-05 — End: 2023-05-05
  Administered 2023-05-05: 12.5 mg via ORAL
  Filled 2023-05-05: qty 1

## 2023-05-05 MED ORDER — ACETAMINOPHEN 325 MG PO TABS
650.0000 mg | ORAL_TABLET | Freq: Once | ORAL | Status: AC
  Administered 2023-05-05: 650 mg via ORAL
  Filled 2023-05-05: qty 2

## 2023-05-05 MED ORDER — BENZONATATE 100 MG PO CAPS: 100.0000 mg | ORAL_CAPSULE | Freq: Three times a day (TID) | ORAL | 0 refills | Status: DC

## 2023-05-05 NOTE — ED Notes (Signed)
Patient stated Hiccups have stopped. MD made aware.

## 2023-05-05 NOTE — Discharge Instructions (Signed)
Do not take any Viagra while you are taking this medication  You will need to follow-up with your family doctor within a week if you are not doing any better but please be aware that you have COVID-19 and this may last for about a week with coughing body aches fevers chills sore throat.  Take the Paxlovid twice a day for 5 days exactly as prescribed  Tessalon is a cough medication that helps reduce the amount of coughing that you are having.  You may take up to 200 mg every 8 hours as needed.  This can be used safely with most or other over-the-counter medications but talk to your pharmacist before taking anything else over-the-counter with it  Please take Naprosyn, 500mg  by mouth twice daily as needed for pain - this in an antiinflammatory medicine (NSAID) and is similar to ibuprofen - many people feel that it is stronger than ibuprofen and it is easier to take since it is a smaller pill.  Please use this only for 1 week - if your pain persists, you will need to follow up with your doctor in the office for ongoing guidance and pain control.  You may also use Tylenol up to 1000 mg every 6 hours as needed for fever  Thank you for allowing [place] to treat you in the emergency department today.  After reviewing your examination and potential testing that was done it appears that you are safe to go home.  I would like for you to follow-up with your doctor within the next several days, have them obtain your records and follow-up with them to review all potential tests and results from your visit.  If you should develop severe or worsening symptoms return to the emergency department immediately

## 2023-05-05 NOTE — ED Triage Notes (Addendum)
Patient come in POV, Complaint of "hiccups for the last 8 Hours, and have progressively gotten worse, and had a fever of 102.9 earlier to night around 11pm." Chills and body ache, denise's nausea and vomiting.

## 2023-05-05 NOTE — ED Provider Notes (Signed)
[place] EMERGENCY DEPARTMENT AT [facility] Provider Note   CSN: [id number] Arrival date & time: 05/05/23  4098     History  Chief Complaint  Patient presents with   Hiccups   Fever    David Brooks is a 58 y.o. male.   Fever Associated symptoms: cough, myalgias and sore throat    This patient is a very pleasant 58 year old male, history of hypertension and diabetes, obesity and high cholesterol.  He presents to the hospital within approximately 24 hours of starting to have fevers as high as 102.9, muscle aches, sore throat, cough, and unfortunately has developed about 8 hours of constant hiccuping.  The patient reports that he has had this several times in the past that usually is associated with some type of illness.  He denies chest pain, denies shortness of breath, he works at a [employer] in the public but is not aware of anybody specific that has been sick around him.  He did take a dose of NyQuil overnight but did not help much other than with the fever and bodyaches.  Continues to have hiccups which is his primary concern.    Home Medications Prior to Admission medications   Medication Sig Start Date End Date Taking? Authorizing Provider  benzonatate (TESSALON) 100 MG capsule Take 1 capsule (100 mg total) by mouth every 8 (eight) hours. 05/05/23  Yes Eber Hong, MD  naproxen (NAPROSYN) 500 MG tablet Take 1 tablet (500 mg total) by mouth 2 (two) times daily with a meal. 05/05/23  Yes Eber Hong, MD  nirmatrelvir/ritonavir (PAXLOVID, 300/100,) 20 x 150 MG & 10 x 100MG  TBPK Take 3 tablets by mouth 2 (two) times daily for 5 days. Patient GFR is 40. Take nirmatrelvir (150 mg) two tablets twice daily for 5 days and ritonavir (100 mg) one tablet twice daily for 5 days. 05/05/23 05/10/23 Yes Eber Hong, MD  ALPRAZolam Prudy Feeler) 1 MG tablet Take by mouth. 12/13/19   [provider]  amLODipine (NORVASC) 10 MG tablet Take 10 mg by mouth daily.     [provider]  ammonium lactate (AMLACTIN) 12 % cream Apply 1 Application topically as needed for dry skin. 05/08/22   Vivi Barrack, DPM  aspirin EC 81 MG tablet Take 81 mg by mouth daily.    [provider]  atenolol (TENORMIN) 50 MG tablet Take 50 mg by mouth daily. for high blood pressure 10/27/17   [provider]  chlorthalidone (HYGROTON) 25 MG tablet Take 25 mg by mouth daily. 03/16/22   [provider]  ciclopirox (PENLAC) 8 % solution Apply topically at bedtime. Apply over nail and surrounding skin. Apply daily over previous coat. After seven (7) days, may remove with alcohol and continue cycle. 06/14/20   Vivi Barrack, DPM  DULoxetine (CYMBALTA) 60 MG capsule TAKE 1 CAPSULE BY MOUTH ONCE DAILY FOR 1 2 WEEKS. THEN INCREASE THE DOSE TO ONE CAPSULE TWICE DAILY FOR CHRONIC PAIN 12/15/17   [provider]  ezetimibe (ZETIA) 10 MG tablet Take 10 mg by mouth daily. 04/18/22   [provider]  glimepiride (AMARYL) 1 MG tablet Take 1 tablet by mouth daily. 03/19/18   [provider]  glyBURIDE (DIABETA) 5 MG tablet Take 5 mg by mouth 2 (two) times daily. 11/14/22   [provider]  ibuprofen (ADVIL,MOTRIN) 800 MG tablet Take 800 mg by mouth every 6 (six) hours as needed for moderate pain.    [provider]  losartan (COZAAR) 100 MG tablet Take 100 mg by mouth daily. 11/15/22   [provider]  lovastatin (MEVACOR) 20 MG tablet Take 20 mg by mouth at bedtime.    [provider]  metFORMIN (GLUCOPHAGE-XR) 500 MG 24 hr tablet TAKE 2 TABLETS BY MOUTH TWICE DAILY FOR DIABETES 10/14/17   [provider]  metoprolol succinate (TOPROL-XL) 25 MG 24 hr tablet Take 25 mg by mouth daily. 12/06/22   [provider]  mupirocin ointment (BACTROBAN) 2 % Apply 1 Application topically 2 (two) times daily. 05/08/22   Vivi Barrack, DPM  oxyCODONE-acetaminophen (PERCOCET) 10-325 MG tablet Take 1  tablet by mouth every 4 (four) hours as needed for pain. 04/30/23   Darreld Mclean, MD  pantoprazole (PROTONIX) 40 MG tablet Take 40 mg by mouth daily. 11/25/19   [provider]  pioglitazone (ACTOS) 45 MG tablet Take 45 mg by mouth daily. 01/23/20   [provider]  sildenafil (REVATIO) 20 MG tablet SMARTSIG:0-5 Tablet(s) By Mouth 12/03/19   [provider]  tadalafil (CIALIS) 20 MG tablet SMARTSIG:1 Tablet(s) By Mouth 04/29/22   [provider]  testosterone cypionate (DEPOTESTOSTERONE CYPIONATE) 200 MG/ML injection Inject 200 mg into the muscle every 14 (fourteen) days. 05/02/22   [provider]      Allergies    Penicillins, Shrimp [shellfish allergy], and Tramadol    Review of Systems   Review of Systems  Constitutional:  Positive for fever.  HENT:  Positive for sore throat.   Respiratory:  Positive for cough.   Musculoskeletal:  Positive for myalgias.    Physical Exam Updated Vital Signs BP (!) 157/92   Pulse 93   Temp 98.6 F (37 C) (Oral)   Resp 17   Ht 1.803 m (5\' 11" )   Wt 134.7 kg   SpO2 96%   BMI 41.42 kg/m  Physical Exam Vitals and nursing note reviewed.  Constitutional:      General: He is not in acute distress.    Appearance: He is well-developed. He is diaphoretic.  HENT:     Head: Normocephalic and atraumatic.     Nose: Rhinorrhea present.     Mouth/Throat:     Pharynx: No oropharyngeal exudate or posterior oropharyngeal erythema.     Comments: Tonsils bilaterally mildly swollen, no exudate, uvula midline, phonation normal, mucous membranes moist Eyes:     General: No scleral icterus.       Right eye: No discharge.        Left eye: No discharge.     Conjunctiva/sclera: Conjunctivae normal.     Pupils: Pupils are equal, round, and reactive to light.  Neck:     Thyroid: No thyromegaly.     Vascular: No JVD.  Cardiovascular:     Rate and Rhythm: Regular rhythm. Tachycardia present.     Heart sounds: Normal  heart sounds. No murmur heard.    No friction rub. No gallop.     Comments: Heart rate of 105 bpm in sinus tachycardia, good pulses, no edema Pulmonary:     Effort: Pulmonary effort is normal. No respiratory distress.     Breath sounds: Normal breath sounds. No wheezing or rales.  Abdominal:     General: Bowel sounds are normal. There is no distension.     Palpations: Abdomen is soft. There is no mass.     Tenderness: There is no abdominal tenderness.  Musculoskeletal:        General: No tenderness. Normal range of motion.  Cervical back: Normal range of motion and neck supple.     Right lower leg: No edema.     Left lower leg: No edema.  Lymphadenopathy:     Cervical: No cervical adenopathy.  Skin:    General: Skin is warm.     Findings: No erythema or rash.  Neurological:     Mental Status: He is alert.     Coordination: Coordination normal.  Psychiatric:        Behavior: Behavior normal.     ED Results / Procedures / Treatments   Labs (all labs ordered are listed, but only abnormal results are displayed) Labs Reviewed  RESP PANEL BY RT-PCR (RSV, FLU A&B, COVID)  RVPGX2 - Abnormal; Notable for the following components:      Result Value   SARS Coronavirus 2 by RT PCR POSITIVE (*)    All other components within normal limits    EKG None  Radiology No results found.  Procedures Procedures    Medications Ordered in ED Medications  acetaminophen (TYLENOL) tablet 650 mg (650 mg Oral Given 05/05/23 0737)  chlorproMAZINE (THORAZINE) tablet 12.5 mg (12.5 mg Oral Given 05/05/23 0802)    ED Course/ Medical Decision Making/ A&P                                 Medical Decision Making Risk OTC drugs. Prescription drug management.    This patient presents to the ED for concern of findings of upper respiratory illness, likely influenza type illness differential diagnosis includes influenza, other viral infections, his lungs are totally clear and he is not hypoxic  at all.  He is a diabetic and thus I will check testing to make sure that this is not something treatable as he is within 12 to 24 hours of onset and would qualify for influenza or COVID treatment.  He is agreeable to that plan, he has requested something for the hiccups that we will try chlorpromazine.  He will need to be out of work for a couple of days anyway Additionally the patient is hypertensive at 175/112.  He is on metoprolol, chlorthalidone, losartan and amlodipine, 1 of these was recently started by his family doctor.  He does not know the names of his medicines of hands but states that he takes them at night.  This is likely complicated by his recent use of over-the-counter cough and cold medications.  I suspect he has chronic hypertension as he had a new medicine started a couple of days ago    Additional history obtained:  Additional history obtained from medical record External records from outside source obtained and reviewed including multiple office visits to orthopedics, going back a couple of years there are no admissions to the hospital   Lab Tests:  I Ordered, and personally interpreted labs.  The pertinent results include: Positive for COVID-19, fever has defervesced, hiccups have completely stopped after medications   Imaging Studies ordered:  Not indicated, no active coughing, no shortness of breath, no hypoxia and clear lung sounds on my exam  Medicines ordered and prescription drug management:  I ordered medication including chlorpromazine for hiccups, Tylenol for fever Reevaluation of the patient after these medicines showed that the patient improved I have reviewed the patients home medicines and have made adjustments as needed   Problem List / ED Course:  I have discussed with the patient at the bedside the results, and the meaning  of these results.  They have had opportunity to ask questions,  expressed their understanding to the need for follow-up with  primary care physician Patient very well-appearing at discharge without any hiccups   Social Determinants of Health:  Diabetic           Final Clinical Impression(s) / ED Diagnoses Final diagnoses:  COVID-19  Hiccups    Rx / DC Orders ED Discharge Orders          Ordered    nirmatrelvir/ritonavir (PAXLOVID, 300/100,) 20 x 150 MG & 10 x 100MG  TBPK  2 times daily        05/05/23 0856    benzonatate (TESSALON) 100 MG capsule  Every 8 hours        05/05/23 0856    naproxen (NAPROSYN) 500 MG tablet  2 times daily with meals        05/05/23 0856              Eber Hong, MD 05/05/23 [phone]

## 2023-05-23 ENCOUNTER — Encounter: Payer: Self-pay | Admitting: Orthopaedic Surgery

## 2023-05-23 ENCOUNTER — Ambulatory Visit: Payer: Medicaid Other | Admitting: Orthopaedic Surgery

## 2023-05-23 DIAGNOSIS — Z6841 Body Mass Index (BMI) 40.0 and over, adult: Secondary | ICD-10-CM

## 2023-05-23 DIAGNOSIS — G8929 Other chronic pain: Secondary | ICD-10-CM | POA: Diagnosis not present

## 2023-05-23 DIAGNOSIS — M25512 Pain in left shoulder: Secondary | ICD-10-CM

## 2023-05-23 DIAGNOSIS — M25511 Pain in right shoulder: Secondary | ICD-10-CM | POA: Diagnosis not present

## 2023-05-23 MED ORDER — METHYLPREDNISOLONE ACETATE 40 MG/ML IJ SUSP
40.0000 mg | Freq: Once | INTRAMUSCULAR | Status: AC
  Administered 2023-05-23: 40 mg via INTRA_ARTICULAR

## 2023-05-23 NOTE — Addendum Note (Signed)
 Addended by: Georgann Kim on: 05/23/2023 09:55 AM   Modules accepted: Orders

## 2023-05-23 NOTE — Progress Notes (Signed)
 PROCEDURE NOTE:  The patient request injection, verbal consent was obtained.  The right shoulder was prepped appropriately after time out was performed.   Sterile technique was observed and injection of 1 cc of DepoMedrol 40mg  with several cc's of plain xylocaine. Anesthesia was provided by ethyl chloride and a 20-gauge needle was used to inject the shoulder area. A posterior approach was used.  The injection was tolerated well.  A band aid dressing was applied.  The patient was advised to apply ice later today and tomorrow to the injection sight as needed.   PROCEDURE NOTE:  The patient request injection, verbal consent was obtained.  The left shoulder was prepped appropriately after time out was performed.   Sterile technique was observed and injection of 1 cc of DepoMedrol 40mg  with several cc's of plain xylocaine. Anesthesia was provided by ethyl chloride and a 20-gauge needle was used to inject the shoulder area. A posterior approach was used.  The injection was tolerated well.  A band aid dressing was applied.  The patient was advised to apply ice later today and tomorrow to the injection sight as needed.  Encounter Diagnoses  Name Primary?   Chronic pain of both shoulders Yes   Morbid obesity (HCC)    Body mass index 45.0-49.9, adult (HCC)    Return prn.  Call if any problem.  Precautions discussed.  Electronically Signed Lemond Stable, MD 2/6/20258:14 AM

## 2023-05-29 ENCOUNTER — Ambulatory Visit: Payer: Medicaid Other | Admitting: Orthopaedic Surgery

## 2023-06-06 ENCOUNTER — Telehealth: Payer: Self-pay | Admitting: Orthopaedic Surgery

## 2023-06-06 MED ORDER — OXYCODONE-ACETAMINOPHEN 10-325 MG PO TABS
1.0000 | ORAL_TABLET | ORAL | 0 refills | Status: DC | PRN
Start: 2023-06-06 — End: 2023-07-01

## 2023-06-06 NOTE — Telephone Encounter (Signed)
Dr. Sanjuan Dame pt - spoke w/the pt, he is requesting a refill on Oxycodone 10-325 to be sent to [facility] on [address]

## 2023-07-01 ENCOUNTER — Telehealth: Payer: Self-pay | Admitting: Orthopaedic Surgery

## 2023-07-01 NOTE — Telephone Encounter (Signed)
 DR. Hilda Lias   Patient called and wants a refill on his pain medicine.   oxyCODONE-acetaminophen (PERCOCET) 10-325 MG tablet    Pharmacy: [facility] on [address]

## 2023-07-02 MED ORDER — OXYCODONE-ACETAMINOPHEN 10-325 MG PO TABS
1.0000 | ORAL_TABLET | ORAL | 0 refills | Status: DC | PRN
Start: 2023-07-02 — End: 2023-08-05

## 2023-08-05 ENCOUNTER — Telehealth: Payer: Self-pay | Admitting: Orthopaedic Surgery

## 2023-08-05 MED ORDER — OXYCODONE-ACETAMINOPHEN 10-325 MG PO TABS: 1.0000 | ORAL_TABLET | ORAL | 0 refills | Status: DC | PRN

## 2023-08-05 NOTE — Addendum Note (Signed)
 Addended by: Milinda Allen on: 08/05/2023 12:10 PM   Modules accepted: Orders

## 2023-08-05 NOTE — Telephone Encounter (Signed)
 Dr. Vicente Graham pt - spoke w/the pt, he is requesting a refill for Oxycodone  10-325 to be sent to [facility] on [address]

## 2023-09-03 ENCOUNTER — Telehealth: Payer: Self-pay | Admitting: Orthopaedic Surgery

## 2023-09-03 NOTE — Telephone Encounter (Signed)
 Dr. Vicente Graham pt - spoke w/the pt, he is requesting a refill for Oxycodone  10-325 to be sent to [facility] on [address]

## 2023-09-04 MED ORDER — OXYCODONE-ACETAMINOPHEN 10-325 MG PO TABS: 1.0000 | ORAL_TABLET | ORAL | 0 refills | Status: DC | PRN

## 2023-09-11 ENCOUNTER — Encounter: Payer: Self-pay | Admitting: Orthopaedic Surgery

## 2023-09-11 ENCOUNTER — Ambulatory Visit: Admitting: Orthopaedic Surgery

## 2023-09-11 DIAGNOSIS — M25511 Pain in right shoulder: Secondary | ICD-10-CM | POA: Diagnosis not present

## 2023-09-11 DIAGNOSIS — Z6841 Body Mass Index (BMI) 40.0 and over, adult: Secondary | ICD-10-CM

## 2023-09-11 DIAGNOSIS — G8929 Other chronic pain: Secondary | ICD-10-CM

## 2023-09-11 DIAGNOSIS — M25512 Pain in left shoulder: Secondary | ICD-10-CM

## 2023-09-11 MED ORDER — METHYLPREDNISOLONE ACETATE 40 MG/ML IJ SUSP: 40.0000 mg | Freq: Once | INTRAMUSCULAR | Status: DC

## 2023-09-11 NOTE — Addendum Note (Signed)
 Addended by: [place] Snow T on: 09/11/2023 03:04 PM   Modules accepted: Orders

## 2023-09-11 NOTE — Progress Notes (Signed)
 PROCEDURE NOTE:  The patient request injection, verbal consent was obtained.  The right shoulder was prepped appropriately after time out was performed.   Sterile technique was observed and injection of 1 cc of DepoMedrol 40mg  with several cc's of plain xylocaine. Anesthesia was provided by ethyl chloride and a 20-gauge needle was used to inject the shoulder area. A posterior approach was used.  The injection was tolerated well.  A band aid dressing was applied.  The patient was advised to apply ice later today and tomorrow to the injection sight as needed.   PROCEDURE NOTE:  The patient request injection, verbal consent was obtained.  The left shoulder was prepped appropriately after time out was performed.   Sterile technique was observed and injection of 1 cc of DepoMedrol 40mg  with several cc's of plain xylocaine. Anesthesia was provided by ethyl chloride and a 20-gauge needle was used to inject the shoulder area. A posterior approach was used.  The injection was tolerated well.  A band aid dressing was applied.  The patient was advised to apply ice later today and tomorrow to the injection sight as needed.  Encounter Diagnoses  Name Primary?   Chronic pain of both shoulders Yes   Morbid obesity (HCC)    Body mass index 45.0-49.9, adult (HCC)    Return prn.  Call if any problem.  Precautions discussed.  Electronically Signed Pleasant Brilliant, MD 5/28/20251:48 PM

## 2023-09-30 ENCOUNTER — Telehealth: Payer: Self-pay | Admitting: Orthopaedic Surgery

## 2023-09-30 NOTE — Telephone Encounter (Signed)
 Dr. Vicente Graham pt - pt lvm requesting a refill for Oxycodone  10-325 to be sent to [facility] on [address]  [phone]

## 2023-10-01 MED ORDER — OXYCODONE-ACETAMINOPHEN 10-325 MG PO TABS: 1.0000 | ORAL_TABLET | ORAL | 0 refills | Status: DC | PRN

## 2023-10-03 ENCOUNTER — Other Ambulatory Visit: Payer: Self-pay | Admitting: Nurse Practitioner

## 2023-10-03 DIAGNOSIS — G8929 Other chronic pain: Secondary | ICD-10-CM

## 2023-10-30 ENCOUNTER — Telehealth: Payer: Self-pay | Admitting: Orthopaedic Surgery

## 2023-10-30 MED ORDER — OXYCODONE-ACETAMINOPHEN 10-325 MG PO TABS: 1.0000 | ORAL_TABLET | ORAL | 0 refills | Status: DC | PRN

## 2023-10-30 NOTE — Telephone Encounter (Signed)
 DR. BRENNA   Patient called lvm requesting his pain medicine be called in   oxyCODONE -acetaminophen  (PERCOCET) 10-325 MG tablet   Pharmacy: [facility]

## 2023-11-26 ENCOUNTER — Telehealth: Payer: Self-pay | Admitting: Orthopaedic Surgery

## 2023-11-26 MED ORDER — OXYCODONE-ACETAMINOPHEN 10-325 MG PO TABS: 1.0000 | ORAL_TABLET | ORAL | 0 refills | Status: DC | PRN

## 2023-11-26 NOTE — Telephone Encounter (Signed)
 Dr. Janae pt - pt lvm requesting a refill for Oxycodone  10-325 to be sent to [facility] on [address]

## 2023-11-28 ENCOUNTER — Encounter: Payer: Self-pay | Admitting: Orthopaedic Surgery

## 2023-11-28 ENCOUNTER — Ambulatory Visit: Admitting: Orthopaedic Surgery

## 2023-11-28 DIAGNOSIS — M25511 Pain in right shoulder: Secondary | ICD-10-CM | POA: Diagnosis not present

## 2023-11-28 DIAGNOSIS — G8929 Other chronic pain: Secondary | ICD-10-CM

## 2023-11-28 DIAGNOSIS — M25512 Pain in left shoulder: Secondary | ICD-10-CM

## 2023-11-28 DIAGNOSIS — Z6841 Body Mass Index (BMI) 40.0 and over, adult: Secondary | ICD-10-CM

## 2023-11-28 MED ORDER — METHYLPREDNISOLONE ACETATE 40 MG/ML IJ SUSP
40.0000 mg | Freq: Once | INTRAMUSCULAR | Status: AC
  Administered 2023-11-28: 40 mg via INTRA_ARTICULAR

## 2023-11-28 NOTE — Addendum Note (Signed)
 Addended by: VICENTA EMMIE HERO on: 11/28/2023 11:00 AM   Modules accepted: Orders

## 2023-11-28 NOTE — Progress Notes (Signed)
 PROCEDURE NOTE:  The patient request injection, verbal consent was obtained.  The right shoulder was prepped appropriately after time out was performed.   Sterile technique was observed and injection of 1 cc of DepoMedrol 40mg  with several cc's of plain xylocaine. Anesthesia was provided by ethyl chloride and a 20-gauge needle was used to inject the shoulder area. A posterior approach was used.  The injection was tolerated well.  A band aid dressing was applied.  The patient was advised to apply ice later today and tomorrow to the injection sight as needed.  PROCEDURE NOTE:  The patient request injection, verbal consent was obtained.  The left shoulder was prepped appropriately after time out was performed.   Sterile technique was observed and injection of 1 cc of DepoMedrol 40mg  with several cc's of plain xylocaine. Anesthesia was provided by ethyl chloride and a 20-gauge needle was used to inject the shoulder area. A posterior approach was used.  The injection was tolerated well.  A band aid dressing was applied.  The patient was advised to apply ice later today and tomorrow to the injection sight as needed.  Encounter Diagnoses  Name Primary?   Chronic pain of both shoulders Yes   Morbid obesity (HCC)    Body mass index 45.0-49.9, adult (HCC)    Return prn.  He was at [facility] yesterday and hit his left elbow.  He has pain over lateral epicondyle but full ROM.  I told him to use ice.  If not improved, call and make appointment.  Call if any problem.  Precautions discussed.  Electronically Signed Lemond Stable, MD 8/14/20259:34 AM

## 2023-12-03 ENCOUNTER — Ambulatory Visit: Admitting: Urology

## 2023-12-06 ENCOUNTER — Encounter: Payer: Self-pay | Admitting: Radiology

## 2023-12-11 NOTE — Progress Notes (Incomplete)
 12/12/2023 7:16 PM   David Brooks October 01, 1965 [id number]  Referring provider: Jerel Gee, NP [address] [place],  [place] [address]  No chief complaint on file.   HPI:    PMH: Past Medical History:  Diagnosis Date   Diabetes mellitus without complication (HCC)    Hypertension    Rotator cuff (capsule) sprain     Surgical History: No past surgical history on file.  Home Medications:  Allergies as of 12/12/2023       Reactions   Penicillins Anaphylaxis   Shrimp [shellfish Allergy]    anaphylaxis   Tramadol  Rash   Possible combination with Zantac        Medication List        Accurate as of December 11, 2023  7:16 PM. If you have any questions, ask your nurse or doctor.          ALPRAZolam 1 MG tablet Commonly known as: XANAX Take by mouth.   amLODipine 10 MG tablet Commonly known as: NORVASC Take 10 mg by mouth daily.   ammonium lactate  12 % cream Commonly known as: AMLACTIN Apply 1 Application topically as needed for dry skin.   aspirin EC 81 MG tablet Take 81 mg by mouth daily.   atenolol 50 MG tablet Commonly known as: TENORMIN Take 50 mg by mouth daily. for high blood pressure   benzonatate  100 MG capsule Commonly known as: TESSALON  Take 1 capsule (100 mg total) by mouth every 8 (eight) hours.   chlorthalidone 25 MG tablet Commonly known as: HYGROTON Take 25 mg by mouth daily.   ciclopirox  8 % solution Commonly known as: Penlac  Apply topically at bedtime. Apply over nail and surrounding skin. Apply daily over previous coat. After seven (7) days, may remove with alcohol and continue cycle.   DULoxetine 60 MG capsule Commonly known as: CYMBALTA TAKE 1 CAPSULE BY MOUTH ONCE DAILY FOR 1 2 WEEKS. THEN INCREASE THE DOSE TO ONE CAPSULE TWICE DAILY FOR CHRONIC PAIN   glyBURIDE 5 MG tablet Commonly known as: DIABETA Take 5 mg by mouth 2 (two) times daily.   ibuprofen  800 MG tablet Commonly known as: ADVIL  Take 800 mg by  mouth every 6 (six) hours as needed for moderate pain.   losartan 100 MG tablet Commonly known as: COZAAR Take 100 mg by mouth daily.   losartan-hydrochlorothiazide 100-25 MG tablet Commonly known as: HYZAAR Take 1 tablet by mouth daily.   lovastatin 20 MG tablet Commonly known as: MEVACOR Take 20 mg by mouth at bedtime.   metFORMIN 500 MG 24 hr tablet Commonly known as: GLUCOPHAGE-XR TAKE 2 TABLETS BY MOUTH TWICE DAILY FOR DIABETES   metoprolol succinate 25 MG 24 hr tablet Commonly known as: TOPROL-XL Take 25 mg by mouth daily.   oxyCODONE -acetaminophen  10-325 MG tablet Commonly known as: PERCOCET Take 1 tablet by mouth every 4 (four) hours as needed for pain.   Ozempic (0.25 or 0.5 MG/DOSE) 2 MG/3ML Sopn Generic drug: Semaglutide(0.25 or 0.5MG /DOS) SMARTSIG:0.25 Milligram(s) SUB-Q Once a Week   pantoprazole 40 MG tablet Commonly known as: PROTONIX Take 40 mg by mouth daily.   pioglitazone 45 MG tablet Commonly known as: ACTOS Take 45 mg by mouth daily.   sildenafil  20 MG tablet Commonly known as: REVATIO  SMARTSIG:0-5 Tablet(s) By Mouth   tadalafil  20 MG tablet Commonly known as: CIALIS  SMARTSIG:1 Tablet(s) By Mouth   testosterone  cypionate 200 MG/ML injection Commonly known as: DEPOTESTOSTERONE CYPIONATE Inject 200 mg into the muscle every 14 (fourteen)  days.        Allergies:  Allergies  Allergen Reactions   Penicillins Anaphylaxis   Shrimp [Shellfish Allergy]     anaphylaxis   Tramadol  Rash    Possible combination with Zantac    Family History: Family History  Problem Relation Age of Onset   Hypertension Mother    Hyperlipidemia Mother    Cancer Father        throat    Social History:  reports that he has never smoked. He has never used smokeless tobacco. He reports that he does not drink alcohol and does not use drugs.  ROS: All other review of systems were reviewed and are negative except what is noted above in HPI  Physical  Exam: There were no vitals taken for this visit.  Constitutional:  Alert and oriented, No acute distress. HEENT: [place] AT, moist mucus membranes.  Trachea midline, no masses. Cardiovascular: No clubbing, cyanosis, or edema. Respiratory: Normal respiratory effort, no increased work of breathing. GI: No inguinal hernias GU: Normal phallus. No masses/lesions on penis, testis, scrotum. Prostate ***g smooth no nodules no induration.  Lymph: No cervical or inguinal lymphadenopathy. Skin: No rashes, bruises or suspicious lesions. Neurologic: Grossly intact, no focal deficits, moving all 4 extremities. Psychiatric: Normal mood and affect.  Laboratory Data: Lab Results  Component Value Date   WBC 8.1 03/17/2020   HGB 14.1 03/17/2020   HCT 41.1 03/17/2020   MCV 86.7 03/17/2020   PLT 296 03/17/2020    Lab Results  Component Value Date   CREATININE 1.15 03/17/2020    Lab Results  Component Value Date   PSA 0.25 10/22/2012    Lab Results  Component Value Date   TESTOSTERONE  383 10/22/2012   16 pages of PCP records reviewed  PSA Jan 2025--0.24  Total/Free T levels  Urinalysis   Pertinent Imaging:   Assessment:    Plan:    There are no diagnoses linked to this encounter.  No follow-ups on file.  [name], MD  [facility] [place]

## 2023-12-12 ENCOUNTER — Ambulatory Visit: Admitting: Urology

## 2023-12-12 DIAGNOSIS — R3129 Other microscopic hematuria: Secondary | ICD-10-CM

## 2023-12-19 ENCOUNTER — Ambulatory Visit: Admitting: Orthopaedic Surgery

## 2023-12-19 ENCOUNTER — Encounter: Payer: Self-pay | Admitting: Orthopaedic Surgery

## 2023-12-19 DIAGNOSIS — G8929 Other chronic pain: Secondary | ICD-10-CM

## 2023-12-19 DIAGNOSIS — M25512 Pain in left shoulder: Secondary | ICD-10-CM

## 2023-12-19 DIAGNOSIS — M7712 Lateral epicondylitis, left elbow: Secondary | ICD-10-CM | POA: Diagnosis not present

## 2023-12-19 DIAGNOSIS — Z6841 Body Mass Index (BMI) 40.0 and over, adult: Secondary | ICD-10-CM

## 2023-12-19 NOTE — Addendum Note (Signed)
 Addended by: Khyran Riera on: 12/19/2023 10:33 AM   Modules accepted: Orders

## 2023-12-19 NOTE — Progress Notes (Signed)
 My shoulder is worse.  He has pain of the left elbow over laterally consistent with tennis elbow.  This occurred from a water park injury 11-27-23.  It is somewhat better.  He is using ice massage.  However his left shoulder is worse.  In reflection, he may have hurt this also in the water park episode.  He has pain now most of the time in the left shoulder and is not sleeping well.  I will get MRI of the shoulder.  He has pain over the left elbow lateral epicondyle area with pain to resisted dorsiflexion of the left wrist but less pain than last time.  He has no redness or swelling.  He has pain with motion of the left shoulder.  Motion is good but pain starts at forward 90 or abduction past 60.  NV intact.  Encounter Diagnoses  Name Primary?   Chronic left shoulder pain Yes   Lateral epicondylitis, left elbow    Morbid obesity (HCC)    Body mass index 45.0-49.9, adult (HCC)    Get MRI of the left shoulder.  Return in three weeks.  I have informed the patient I will be retiring from medical practice and from this office on January 16, 2024.  The patient has been offered continuing care with Dr. Margrette or Dr. Onesimo of this office.  The patient may choose another provider and the records will be forwarded after proper signature and notification.  Patient understands and agrees.  Call if any problem.  Precautions discussed.  Electronically Signed Lemond Stable, MD 9/4/20258:51 AM

## 2023-12-24 ENCOUNTER — Telehealth: Payer: Self-pay | Admitting: Orthopaedic Surgery

## 2023-12-24 MED ORDER — OXYCODONE-ACETAMINOPHEN 10-325 MG PO TABS: 1.0000 | ORAL_TABLET | ORAL | 0 refills | Status: DC | PRN

## 2023-12-24 NOTE — Telephone Encounter (Signed)
 Dr. Janae pt - pt lvm requesting a refill for Oxycodone  10-325 to be sent to [facility] on [address]

## 2023-12-30 ENCOUNTER — Telehealth: Payer: Self-pay | Admitting: Orthopaedic Surgery

## 2023-12-30 NOTE — Telephone Encounter (Signed)
 Pt lvm stating that he received a letter stating his MRI was approved and he hasn't been scheduled.  I called the pt and lvm advising that he would need to call and schedule his MRI and gave him the # for Central Scheduling.  Asked that he call me back to schedule a f/u once he has the date for the MRI>

## 2024-01-06 ENCOUNTER — Ambulatory Visit (HOSPITAL_COMMUNITY)
Admission: RE | Admit: 2024-01-06 | Discharge: 2024-01-06 | Disposition: A | Discharge status: Home / Self Care | Source: Ambulatory Visit | Attending: Orthopaedic Surgery | Admitting: Orthopaedic Surgery

## 2024-01-06 DIAGNOSIS — G8929 Other chronic pain: Secondary | ICD-10-CM | POA: Diagnosis present

## 2024-01-06 DIAGNOSIS — M25512 Pain in left shoulder: Secondary | ICD-10-CM | POA: Diagnosis present

## 2024-01-08 ENCOUNTER — Ambulatory Visit: Admitting: Orthopaedic Surgery

## 2024-01-09 ENCOUNTER — Telehealth: Payer: Self-pay | Admitting: Radiology

## 2024-01-09 ENCOUNTER — Ambulatory Visit (INDEPENDENT_AMBULATORY_CARE_PROVIDER_SITE_OTHER): Admitting: Orthopaedic Surgery

## 2024-01-09 ENCOUNTER — Encounter: Payer: Self-pay | Admitting: Orthopaedic Surgery

## 2024-01-09 DIAGNOSIS — G8929 Other chronic pain: Secondary | ICD-10-CM

## 2024-01-09 DIAGNOSIS — Z6841 Body Mass Index (BMI) 40.0 and over, adult: Secondary | ICD-10-CM | POA: Diagnosis not present

## 2024-01-09 DIAGNOSIS — M25512 Pain in left shoulder: Secondary | ICD-10-CM | POA: Diagnosis not present

## 2024-01-09 MED ORDER — IBUPROFEN 800 MG PO TABS: 800.0000 mg | ORAL_TABLET | Freq: Four times a day (QID) | ORAL | 5 refills | Status: DC | PRN

## 2024-01-09 NOTE — Progress Notes (Signed)
 My shoulder is hurting  He had the MRI of the left shoulder and it showed: IMPRESSION: There is a large full-thickness tear of the supraspinatus tendon retracted several centimeters. The tear involves some of the anterior insertional fibers of the infraspinatus tendon as well. No significant muscle belly atrophy.   Complete tear of the proximal biceps tendon retracted into the mid bicipital groove.   Mild interstitial tear to the subscapularis tendon.  I have explained the findings to him.  I will have him see Dr. Onesimo in the [facility] office.  He says he cannot have surgery now secondary to work issues.  I have independently reviewed the MRI.    ROM of the left shoulder is painful and limited secondary to the pain.  NV intact.  He has no effusion.  Encounter Diagnoses  Name Primary?   Chronic left shoulder pain Yes   Morbid obesity (HCC)    Body mass index 45.0-49.9, adult (HCC)    I will call in ibuprofen  800 for him.  To see Dr. Onesimo.  I have informed the patient I will be retiring from medical practice and from this office on January 16, 2024.  The patient has been offered continuing care with Dr. Margrette or Dr. Onesimo of this office.  The patient may choose another provider and the records will be forwarded after proper signature and notification.  Patient understands and agrees.  Call if any problem.  Precautions discussed.  Electronically Signed Lemond Stable, MD 9/25/20259:39 AM

## 2024-01-09 NOTE — Telephone Encounter (Signed)
 I called patient and advised.

## 2024-01-09 NOTE — Telephone Encounter (Signed)
 Patient called with questions that he forgot to ask at visit:   Would you be able to send in refill for his pain medication for next month prior to you retiring?  2.  Since he has had to take Oxycodone  more frequently due to no ibuprofen  refill from PCP, is there anything that you can recommend for him as he will run out too soon?  3.  He has filed for disability.  Would you be able to fill out some paperwork for him prior to your retirement since you have been his physician for so long?   Please advise.  CB for patient is [phone]

## 2024-01-22 ENCOUNTER — Telehealth: Payer: Self-pay | Admitting: Orthopedic Surgery

## 2024-01-22 NOTE — Telephone Encounter (Signed)
 Returned the pt's call, the pt had lvm stating that Dr. MARLA was going to send in his pain meds before he retired.  Nothing was sent in.  I lvm for the pt explaining that neither Dr. JAYSON or Dr. VEAR will prescribe anything until he establishes care with one of them.  He has an appointment for 01/31/24 with Dr. Onesimo.

## 2024-01-24 ENCOUNTER — Encounter: Payer: Self-pay | Admitting: Orthopedic Surgery

## 2024-01-24 ENCOUNTER — Ambulatory Visit: Admitting: Orthopedic Surgery

## 2024-01-24 VITALS — BP 153/93 | HR 79

## 2024-01-24 DIAGNOSIS — G8929 Other chronic pain: Secondary | ICD-10-CM

## 2024-01-24 DIAGNOSIS — S46012D Strain of muscle(s) and tendon(s) of the rotator cuff of left shoulder, subsequent encounter: Secondary | ICD-10-CM

## 2024-01-24 DIAGNOSIS — M75121 Complete rotator cuff tear or rupture of right shoulder, not specified as traumatic: Secondary | ICD-10-CM | POA: Diagnosis not present

## 2024-01-24 DIAGNOSIS — S46011D Strain of muscle(s) and tendon(s) of the rotator cuff of right shoulder, subsequent encounter: Secondary | ICD-10-CM

## 2024-01-24 MED ORDER — OXYCODONE-ACETAMINOPHEN 10-325 MG PO TABS: 1.0000 | ORAL_TABLET | ORAL | 0 refills | Status: AC | PRN

## 2024-01-24 MED ORDER — IBUPROFEN 800 MG PO TABS: 800.0000 mg | ORAL_TABLET | Freq: Three times a day (TID) | ORAL | 5 refills | Status: DC | PRN

## 2024-01-24 NOTE — Progress Notes (Signed)
 New Patient Visit  Assessment: David Brooks is a 58 y.o. male with the following: 1. Chronic pain of both shoulders 2. Nontraumatic complete tear of right rotator cuff 3.  Full-thickness tear of the left rotator cuff   Plan: David Brooks has chronic pain in bilateral shoulders.  He has MRIs documenting full-thickness tears with retraction of both the right shoulder and the left shoulder.  Right shoulder MRI is from 2020.  More recent MRI of the left shoulder demonstrates a similar type findings.  He has reasons to have pain.  He has been on narcotic pain medicines for several years.  He has been getting injections frequently.  He is not interested in an injection today.  He is able to continue with his previous level of function.  We briefly discussed the possibility of repairing these rotator cuff tear injuries versus reconstruction.  He cannot take time off work.  He is considering disability.  If he wishes to proceed with surgery, I am happy to help him at any time.  If he would like repeat injections, he can return to clinic.  I provided him with updated prescriptions for oxycodone , as well as ibuprofen .  However, I have advised him that I would not continue to provide this medication.  We will place a referral for him to be evaluated by pain management.  Follow-up: Return if symptoms worsen or fail to improve.  Subjective:  Chief Complaint  Patient presents with   Shoulder Pain    Bilat for yrs L > R. Pt does work at [employer] and does a lot of lifting and moving. Pt recently had MRI on the L that showed     History of Present Illness: David Brooks is a 58 y.o. male who presents for evaluation of bilateral shoulder pain.  Left is more acute than the right.  He has a long history of chronic right shoulder pain.  More recently, he has injured the left shoulder.  He has MRIs of both shoulders documenting full-thickness tears.  He is not interested in surgery.  He cannot afford to  take time off work.  He has been taking narcotic pain medications for several years.  This controls the pain, and allows him to continue to work.  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   Review of Systems: No fevers or chills No numbness or tingling No chest pain No shortness of breath No bowel or bladder dysfunction No GI distress No headaches   Medical History:  Past Medical History:  Diagnosis Date   Diabetes mellitus without complication (HCC)    Hypertension    Rotator cuff (capsule) sprain     No past surgical history on file.  Family History  Problem Relation Age of Onset   Hypertension Mother    Hyperlipidemia Mother    Cancer Father        throat   Social History   Tobacco Use   Smoking status: Never   Smokeless tobacco: Never  Substance Use Topics   Alcohol use: No   Drug use: No    Allergies  Allergen Reactions   Penicillins Anaphylaxis   Shrimp [Shellfish Allergy]     anaphylaxis   Tramadol  Rash    Possible combination with Zantac    Current Meds  Medication Sig   ALPRAZolam (XANAX) 1 MG tablet Take by mouth.   amLODipine (NORVASC) 10 MG tablet Take 10 mg by mouth  daily.   ammonium lactate  (AMLACTIN) 12 % cream Apply 1 Application topically as needed for dry skin.   aspirin EC 81 MG tablet Take 81 mg by mouth daily.   atenolol (TENORMIN) 50 MG tablet Take 50 mg by mouth daily. for high blood pressure   benzonatate  (TESSALON ) 100 MG capsule Take 1 capsule (100 mg total) by mouth every 8 (eight) hours.   chlorthalidone (HYGROTON) 25 MG tablet Take 25 mg by mouth daily.   ciclopirox  (PENLAC ) 8 % solution Apply topically at bedtime. Apply over nail and surrounding skin. Apply daily over previous coat. After seven (7) days, may remove with alcohol and continue cycle.   DULoxetine (CYMBALTA) 60 MG capsule TAKE 1 CAPSULE BY MOUTH ONCE DAILY FOR 1 2 WEEKS. THEN INCREASE THE DOSE TO ONE CAPSULE TWICE DAILY FOR  CHRONIC PAIN   glyBURIDE (DIABETA) 5 MG tablet Take 5 mg by mouth 2 (two) times daily.   ibuprofen  (ADVIL ) 800 MG tablet Take 1 tablet (800 mg total) by mouth every 8 (eight) hours as needed for moderate pain (pain score 4-6).   losartan (COZAAR) 100 MG tablet Take 100 mg by mouth daily.   losartan-hydrochlorothiazide (HYZAAR) 100-25 MG tablet Take 1 tablet by mouth daily.   lovastatin (MEVACOR) 20 MG tablet Take 20 mg by mouth at bedtime.   metFORMIN (GLUCOPHAGE-XR) 500 MG 24 hr tablet TAKE 2 TABLETS BY MOUTH TWICE DAILY FOR DIABETES   metoprolol succinate (TOPROL-XL) 25 MG 24 hr tablet Take 25 mg by mouth daily.   oxyCODONE -acetaminophen  (PERCOCET) 10-325 MG tablet Take 1 tablet by mouth every 4 (four) hours as needed for up to 7 days for pain.   OZEMPIC, 0.25 OR 0.5 MG/DOSE, 2 MG/3ML SOPN SMARTSIG:0.25 Milligram(s) SUB-Q Once a Week   pantoprazole (PROTONIX) 40 MG tablet Take 40 mg by mouth daily.   pioglitazone (ACTOS) 45 MG tablet Take 45 mg by mouth daily.   sildenafil  (REVATIO ) 20 MG tablet SMARTSIG:0-5 Tablet(s) By Mouth   tadalafil  (CIALIS ) 20 MG tablet SMARTSIG:1 Tablet(s) By Mouth   testosterone  cypionate (DEPOTESTOSTERONE CYPIONATE) 200 MG/ML injection Inject 200 mg into the muscle every 14 (fourteen) days.    Objective: BP (!) 153/93   Pulse 79   Physical Exam:  General: Alert and oriented. and No acute distress. Gait: Normal gait.  Bilateral shoulders without deformity.  No point tenderness.  Good range of motion, with some discomfort.  Positive Jobes.  4+/5 strength in the supraspinatus and infraspinatus.  Negative belly press.  IMAGING: No new imaging obtained today   New Medications:  Meds ordered this encounter  Medications   oxyCODONE -acetaminophen  (PERCOCET) 10-325 MG tablet    Sig: Take 1 tablet by mouth every 4 (four) hours as needed for up to 7 days for pain.    Dispense:  42 tablet    Refill:  0   ibuprofen  (ADVIL ) 800 MG tablet    Sig: Take 1 tablet  (800 mg total) by mouth every 8 (eight) hours as needed for moderate pain (pain score 4-6).    Dispense:  120 tablet    Refill:  5      David DELENA Horde, MD  01/24/2024 12:48 PM

## 2024-01-24 NOTE — Patient Instructions (Signed)
 Interested in injections in bilateral shoulders, I am happy to help.  Please schedule an appointment.  We have placed a referral for pain management.  I have provided a limited prescription for narcotics.  Please contact the clinic if you have any further questions or concerns

## 2024-01-31 ENCOUNTER — Ambulatory Visit: Admitting: Orthopedic Surgery

## 2024-02-04 ENCOUNTER — Telehealth: Payer: Self-pay

## 2024-02-04 NOTE — Telephone Encounter (Signed)
 Patient is asking for a 7 day supply of pain medication. Hydrocodone -Acetaminophen  7.5/325 MG    He has an appointment with pain management on 02/13/24  PATIENT USES [facility] ON [address]

## 2024-02-05 MED ORDER — HYDROCODONE-ACETAMINOPHEN 7.5-325 MG PO TABS: 1.0000 | ORAL_TABLET | ORAL | 0 refills | Status: AC | PRN

## 2024-02-17 ENCOUNTER — Encounter: Payer: Self-pay | Admitting: Radiology

## 2024-02-18 ENCOUNTER — Ambulatory Visit: Admitting: Urology

## 2024-03-15 ENCOUNTER — Inpatient Hospital Stay (HOSPITAL_COMMUNITY)
Admission: EM | Admit: 2024-03-15 | Discharge: 2024-03-18 | DRG: 291 | Disposition: A | Attending: Hospitalist | Admitting: Hospitalist

## 2024-03-15 ENCOUNTER — Encounter (HOSPITAL_COMMUNITY): Payer: Self-pay | Admitting: Emergency Medicine

## 2024-03-15 ENCOUNTER — Emergency Department (HOSPITAL_COMMUNITY)

## 2024-03-15 ENCOUNTER — Other Ambulatory Visit: Payer: Self-pay

## 2024-03-15 ENCOUNTER — Inpatient Hospital Stay (HOSPITAL_COMMUNITY)

## 2024-03-15 DIAGNOSIS — I5031 Acute diastolic (congestive) heart failure: Secondary | ICD-10-CM | POA: Diagnosis not present

## 2024-03-15 DIAGNOSIS — E119 Type 2 diabetes mellitus without complications: Secondary | ICD-10-CM

## 2024-03-15 DIAGNOSIS — J810 Acute pulmonary edema: Secondary | ICD-10-CM

## 2024-03-15 DIAGNOSIS — J9601 Acute respiratory failure with hypoxia: Secondary | ICD-10-CM | POA: Diagnosis not present

## 2024-03-15 DIAGNOSIS — G4733 Obstructive sleep apnea (adult) (pediatric): Secondary | ICD-10-CM | POA: Diagnosis present

## 2024-03-15 DIAGNOSIS — J811 Chronic pulmonary edema: Secondary | ICD-10-CM | POA: Insufficient documentation

## 2024-03-15 DIAGNOSIS — M25519 Pain in unspecified shoulder: Secondary | ICD-10-CM

## 2024-03-15 DIAGNOSIS — R0602 Shortness of breath: Secondary | ICD-10-CM | POA: Diagnosis present

## 2024-03-15 DIAGNOSIS — E1165 Type 2 diabetes mellitus with hyperglycemia: Secondary | ICD-10-CM | POA: Diagnosis not present

## 2024-03-15 DIAGNOSIS — I10 Essential (primary) hypertension: Secondary | ICD-10-CM | POA: Diagnosis present

## 2024-03-15 DIAGNOSIS — G8929 Other chronic pain: Secondary | ICD-10-CM

## 2024-03-15 LAB — RESPIRATORY PANEL BY PCR

## 2024-03-15 LAB — CBC WITH DIFFERENTIAL/PLATELET
Abs Immature Granulocytes: 0.02 K/uL (ref 0.00–0.07)
Basophils Absolute: 0.1 K/uL (ref 0.0–0.1)
Basophils Relative: 1 %
Eosinophils Absolute: 0.1 K/uL (ref 0.0–0.5)
Eosinophils Relative: 1 %
HCT: 43 % (ref 39.0–52.0)
Hemoglobin: 14.7 g/dL (ref 13.0–17.0)
Immature Granulocytes: 0 %
Lymphocytes Relative: 11 %
Lymphs Abs: 1 K/uL (ref 0.7–4.0)
MCH: 29 pg (ref 26.0–34.0)
MCHC: 34.2 g/dL (ref 30.0–36.0)
MCV: 84.8 fL (ref 80.0–100.0)
Monocytes Absolute: 0.9 K/uL (ref 0.1–1.0)
Monocytes Relative: 9 %
Neutro Abs: 7.2 K/uL (ref 1.7–7.7)
Neutrophils Relative %: 78 %
Platelets: 223 K/uL (ref 150–400)
RBC: 5.07 MIL/uL (ref 4.22–5.81)
RDW: 13.1 % (ref 11.5–15.5)
WBC: 9.3 K/uL (ref 4.0–10.5)
nRBC: 0 % (ref 0.0–0.2)

## 2024-03-15 LAB — BLOOD GAS, VENOUS
Acid-Base Excess: 6.5 mmol/L — ABNORMAL HIGH (ref 0.0–2.0)
Bicarbonate: 32.8 mmol/L — ABNORMAL HIGH (ref 20.0–28.0)
Drawn by: 5497
O2 Saturation: 50.1 %
Patient temperature: 36.8
pCO2, Ven: 52 mmHg (ref 44–60)
pH, Ven: 7.4 (ref 7.25–7.43)
pO2, Ven: <31 mmHg — CL (ref 32–45)

## 2024-03-15 LAB — CBC
HCT: 43.1 % (ref 39.0–52.0)
Hemoglobin: 14.6 g/dL (ref 13.0–17.0)
MCH: 29.1 pg (ref 26.0–34.0)
MCHC: 33.9 g/dL (ref 30.0–36.0)
MCV: 86 fL (ref 80.0–100.0)
Platelets: 220 K/uL (ref 150–400)
RBC: 5.01 MIL/uL (ref 4.22–5.81)
RDW: 13.2 % (ref 11.5–15.5)
WBC: 10.2 K/uL (ref 4.0–10.5)
nRBC: 0 % (ref 0.0–0.2)

## 2024-03-15 LAB — COMPREHENSIVE METABOLIC PANEL WITH GFR
ALT: 16 U/L (ref 0–44)
AST: 32 U/L (ref 15–41)
Albumin: 3.7 g/dL (ref 3.5–5.0)
Alkaline Phosphatase: 46 U/L (ref 38–126)
Anion gap: 12 (ref 5–15)
BUN: 14 mg/dL (ref 6–20)
CO2: 25 mmol/L (ref 22–32)
Calcium: 8.4 mg/dL — ABNORMAL LOW (ref 8.9–10.3)
Chloride: 97 mmol/L — ABNORMAL LOW (ref 98–111)
Creatinine, Ser: 1.19 mg/dL (ref 0.61–1.24)
GFR, Estimated: >60 mL/min (ref >60)
Glucose, Bld: 271 mg/dL — ABNORMAL HIGH (ref 70–99)
Potassium: 3.5 mmol/L (ref 3.5–5.1)
Sodium: 134 mmol/L — ABNORMAL LOW (ref 135–145)
Total Bilirubin: 0.4 mg/dL (ref 0.0–1.2)
Total Protein: 6.7 g/dL (ref 6.5–8.1)

## 2024-03-15 LAB — ECHOCARDIOGRAM COMPLETE BUBBLE STUDY
Area-P 1/2: 3.6 cm2
P 1/2 time: 510 ms
S' Lateral: 3.5 cm

## 2024-03-15 LAB — CBG MONITORING, ED: Glucose-Capillary: 198 mg/dL — ABNORMAL HIGH (ref 70–99)

## 2024-03-15 LAB — TROPONIN T, HIGH SENSITIVITY
Troponin T High Sensitivity: 29 ng/L — ABNORMAL HIGH (ref 0–19)
Troponin T High Sensitivity: 28 ng/L — ABNORMAL HIGH (ref 0–19)

## 2024-03-15 LAB — GLUCOSE, CAPILLARY
Glucose-Capillary: 224 mg/dL — ABNORMAL HIGH (ref 70–99)
Glucose-Capillary: 123 mg/dL — ABNORMAL HIGH (ref 70–99)
Glucose-Capillary: 227 mg/dL — ABNORMAL HIGH (ref 70–99)

## 2024-03-15 LAB — CREATININE, SERUM
Creatinine, Ser: 1.29 mg/dL — ABNORMAL HIGH (ref 0.61–1.24)
GFR, Estimated: >60 mL/min (ref >60)

## 2024-03-15 LAB — POTASSIUM: Potassium: 3.2 mmol/L — ABNORMAL LOW (ref 3.5–5.1)

## 2024-03-15 LAB — PRO BRAIN NATRIURETIC PEPTIDE: Pro Brain Natriuretic Peptide: 412 pg/mL — ABNORMAL HIGH (ref <300.0)

## 2024-03-15 LAB — PROCALCITONIN: Procalcitonin: <0.1 ng/mL

## 2024-03-15 MED ORDER — ASPIRIN 81 MG PO TBEC
81.0000 mg | DELAYED_RELEASE_TABLET | Freq: Every day | ORAL | Status: DC
  Administered 2024-03-15 – 2024-03-17 (×3): 81 mg via ORAL
  Filled 2024-03-15 (×3): qty 1

## 2024-03-15 MED ORDER — DULOXETINE HCL 60 MG PO CPEP
60.0000 mg | ORAL_CAPSULE | Freq: Every day | ORAL | Status: DC
  Administered 2024-03-15 – 2024-03-17 (×3): 60 mg via ORAL
  Filled 2024-03-15: qty 1
  Filled 2024-03-15: qty 2
  Filled 2024-03-15 (×2): qty 1

## 2024-03-15 MED ORDER — AMLODIPINE BESYLATE 5 MG PO TABS
10.0000 mg | ORAL_TABLET | Freq: Every day | ORAL | Status: DC
  Administered 2024-03-15 – 2024-03-17 (×3): 10 mg via ORAL
  Filled 2024-03-15 (×4): qty 2

## 2024-03-15 MED ORDER — FUROSEMIDE 10 MG/ML IJ SOLN: 40.0000 mg | Freq: Every day | INTRAMUSCULAR | Status: DC

## 2024-03-15 MED ORDER — PANTOPRAZOLE SODIUM 40 MG PO TBEC: 40.0000 mg | DELAYED_RELEASE_TABLET | ORAL | Status: DC | PRN

## 2024-03-15 MED ORDER — BENZONATATE 100 MG PO CAPS
100.0000 mg | ORAL_CAPSULE | Freq: Once | ORAL | Status: AC
  Administered 2024-03-15: 100 mg via ORAL
  Filled 2024-03-15: qty 1

## 2024-03-15 MED ORDER — FUROSEMIDE 10 MG/ML IJ SOLN
40.0000 mg | Freq: Two times a day (BID) | INTRAMUSCULAR | Status: DC
  Administered 2024-03-15 – 2024-03-17 (×4): 40 mg via INTRAVENOUS
  Filled 2024-03-15 (×4): qty 4

## 2024-03-15 MED ORDER — HYDROCHLOROTHIAZIDE 25 MG PO TABS: 25.0000 mg | ORAL_TABLET | Freq: Every day | ORAL | Status: DC

## 2024-03-15 MED ORDER — ACETAMINOPHEN 325 MG PO TABS
650.0000 mg | ORAL_TABLET | Freq: Four times a day (QID) | ORAL | Status: DC | PRN
  Administered 2024-03-15 – 2024-03-16 (×3): 650 mg via ORAL
  Filled 2024-03-15 (×4): qty 2

## 2024-03-15 MED ORDER — OXYCODONE-ACETAMINOPHEN 5-325 MG PO TABS
1.0000 | ORAL_TABLET | Freq: Four times a day (QID) | ORAL | Status: DC | PRN
  Administered 2024-03-15 – 2024-03-17 (×4): 1 via ORAL
  Filled 2024-03-15 (×4): qty 1

## 2024-03-15 MED ORDER — DAPAGLIFLOZIN PROPANEDIOL 10 MG PO TABS
10.0000 mg | ORAL_TABLET | Freq: Every day | ORAL | Status: DC
  Administered 2024-03-15 – 2024-03-17 (×3): 10 mg via ORAL
  Filled 2024-03-15 (×3): qty 1

## 2024-03-15 MED ORDER — INSULIN ASPART 100 UNIT/ML IJ SOLN
0.0000 [IU] | Freq: Three times a day (TID) | INTRAMUSCULAR | Status: DC
  Administered 2024-03-15: 5 [IU] via SUBCUTANEOUS
  Administered 2024-03-15: 2 [IU] via SUBCUTANEOUS
  Administered 2024-03-15: 3 [IU] via SUBCUTANEOUS
  Administered 2024-03-15: 5 [IU] via SUBCUTANEOUS
  Administered 2024-03-16 (×3): 2 [IU] via SUBCUTANEOUS
  Administered 2024-03-16: 3 [IU] via SUBCUTANEOUS
  Administered 2024-03-17: 5 [IU] via SUBCUTANEOUS
  Administered 2024-03-17: 2 [IU] via SUBCUTANEOUS
  Administered 2024-03-17: 5 [IU] via SUBCUTANEOUS
  Administered 2024-03-17 – 2024-03-18 (×2): 2 [IU] via SUBCUTANEOUS
  Filled 2024-03-15 (×13): qty 1

## 2024-03-15 MED ORDER — FUROSEMIDE 10 MG/ML IJ SOLN
40.0000 mg | Freq: Once | INTRAMUSCULAR | Status: AC
  Administered 2024-03-15: 40 mg via INTRAVENOUS
  Filled 2024-03-15: qty 4

## 2024-03-15 MED ORDER — ENOXAPARIN SODIUM 40 MG/0.4ML IJ SOSY
40.0000 mg | PREFILLED_SYRINGE | INTRAMUSCULAR | Status: DC
  Administered 2024-03-15: 40 mg via SUBCUTANEOUS
  Filled 2024-03-15 (×2): qty 0.4

## 2024-03-15 MED ORDER — LOSARTAN POTASSIUM 50 MG PO TABS
100.0000 mg | ORAL_TABLET | Freq: Every day | ORAL | Status: DC
  Administered 2024-03-15 – 2024-03-17 (×3): 100 mg via ORAL
  Filled 2024-03-15: qty 2
  Filled 2024-03-15: qty 4
  Filled 2024-03-15 (×2): qty 2

## 2024-03-15 MED ORDER — LOSARTAN POTASSIUM-HCTZ 100-25 MG PO TABS: 1.0000 | ORAL_TABLET | Freq: Every day | ORAL | Status: DC

## 2024-03-15 MED ORDER — UMECLIDINIUM BROMIDE 62.5 MCG/ACT IN AEPB
1.0000 | INHALATION_SPRAY | Freq: Every day | RESPIRATORY_TRACT | Status: DC
  Administered 2024-03-16 – 2024-03-18 (×3): 1 via RESPIRATORY_TRACT
  Filled 2024-03-15: qty 7

## 2024-03-15 NOTE — H&P (Signed)
 History and Physical    Patient: David Brooks Brooks FMW:[id number] DOB: 1965/12/13 DOA: 03/15/2024 DOS: the patient was seen and examined on 03/15/2024 David Brooks: David Brooks, No  Patient coming from: Home  Chief Complaint:  Chief Complaint  Patient presents with   Shortness of Breath   Cough   YEP:Tpoopjf David Brooks Brooks is David Brooks 57 y.o. male with  David Brooks chief complaint of progressive shortness of breath over the past five days. 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 Yesterday, he developed David Brooks cough productive of small amounts of sputum, though he denies fever. He reports David Brooks sensation of chest congestion. The patient visited urgent care earlier today, where he tested negative for COVID-19 and influenza. He denies any recent sick contacts. His medical history is significant for hypertension, for which he was recently switched to David Brooks combination medication including amlodipine, but he ran out and was without it for five days until today. He denies any history of heart failure, though he is under evaluation by David Brooks cardiologist (David Brooks Brooks) due to persistently high blood pressure. He has no history of smoking, alcohol, or drug use. He has no known family history of heart disease or stroke. He is currently on several medications, including aspirin, atenolol, chlorthalidone, duloxetine, losartan with hydrochlorothiazide, pantoprazole as needed, and testosterone .  He does have OSA and is suppose to use CPAP but has not used it for 9 years.  Endorses increased urine frequency lately but denies lethargy. Has had significant weight gain in the last month of 30 pounds He is currently not taking any meds for his diabetes. Last A1C he reports as 8.1     Review of Systems: As mentioned in the history of present illness. All other systems reviewed and are negative. Past Medical History:  Diagnosis Date   Diabetes mellitus without  complication (HCC)    Hypertension    Rotator cuff (capsule) sprain    History reviewed. No pertinent surgical history. Social History:  reports that he has never smoked. He has never used smokeless tobacco. He reports that he does not drink alcohol and does not use drugs.  Allergies  Allergen Reactions   Penicillins Anaphylaxis   Shrimp [Shellfish Allergy]     anaphylaxis   Tramadol  Rash    Possible combination with Zantac    Family History  Problem Relation Age of Onset   Hypertension Mother    Hyperlipidemia Mother    Cancer Father        throat    Prior to Admission medications   Medication Sig Start Date End Date Taking? Authorizing Provider  amLODipine (NORVASC) 10 MG tablet Take 10 mg by mouth daily.    David Brooks Brooks  aspirin EC 81 MG tablet Take 81 mg by mouth daily.    David Brooks Brooks  DULoxetine (CYMBALTA) 60 MG capsule TAKE 1 CAPSULE BY MOUTH ONCE DAILY FOR 1 2 WEEKS. THEN INCREASE THE DOSE TO ONE CAPSULE TWICE DAILY FOR CHRONIC PAIN 12/15/17   David Brooks Brooks  losartan-hydrochlorothiazide (HYZAAR) 100-25 MG tablet Take 1 tablet by mouth daily. 08/15/23   David Brooks Brooks  pantoprazole (PROTONIX) 40 MG tablet Take 1 tablet (40 mg total) by mouth as needed. 03/15/24   Brooks, Selinda, Brooks  tadalafil  (CIALIS ) 20 MG tablet SMARTSIG:1 Tablet(s) By Mouth 04/29/22   David Brooks Brooks  testosterone  cypionate (DEPOTESTOSTERONE CYPIONATE) 200 MG/ML injection Inject 200 mg into the muscle  every 14 (fourteen) days. 05/02/22   David Brooks Brooks    Physical Exam: Vitals:   03/15/24 0234 03/15/24 0237 03/15/24 0242 03/15/24 0500  BP:    (!) 158/88  Pulse: 78 78 76 76  Resp: 15 19 20 16   Temp:      TempSrc:      SpO2: (!) 87% 92% 95% 95%  Weight:      Height:        General ill appearing Neuro alert, oriented and conversive but falls asleep easily, MAEE CV SR on monitor, S1S2 no murmur Resp BBS with course crackles exp wheeze  scattered throughout; sats are stable with supplemental oxygen at 2 L/min via [place] Abdomen round, soft,   Data Reviewed: EKG reviewed by me, SR with diffuse non specific min ST depression Chet xray IMPRESSION: 1. Moderately dense perihilar opacities with peripheral sparing, consistent with airspace disease such as due to pneumonia, edema, or inhalational injury such as due to vaping . 2. Trace pleural effusions. 3. Moderately elevated right hemidiaphragm, consistent with paresis or eventration.  Assessment and Plan: Acute diastolic heart failure -ECHO - cardiology consult -lasix 40 daily IV Strict I & O Daily weights  Acute respiratory failure with hypoxia -titrate oxygen keep sats above 92% -noted flu covid negative at urgent care but due to xray, get 20 pathogen resp panel -CPAP for OSA - needs further education and encouragement for CPAP use  Diabetes type 2 .-not currently on management med, last A1C 4 months ago per patient was 8.1 -glucose above 200s today  -ACHS SSI  Hypertension -continue norvasc 10, losartan 100 and hydrochlorothiazide 25 daily  Chronic shoulder pain - continue cymbalta -prn percocet  .    Advance Care Planning:   Code Status: Full Code   Consults: cardiology consult needed  Family Communication: none present  Severity of Illness: The appropriate patient status for this patient is INPATIENT. Inpatient status is judged to be reasonable and necessary in order to provide the required intensity of service to ensure the patient's safety. The patient's presenting symptoms, physical exam findings, and initial radiographic and laboratory data in the context of their chronic comorbidities is felt to place them at high risk for further clinical deterioration. Furthermore, it is not anticipated that the patient will be medically stable for discharge from the hospital within 2 midnights of admission.   * I certify that at the point of admission it is my  clinical judgment that the patient will require inpatient hospital care spanning beyond 2 midnights from the point of admission due to high intensity of service, high risk for further deterioration and high frequency of surveillance required.*  Author: Erminio Cone, NP 03/15/2024 5:18 AM  For on call review [email or web address].

## 2024-03-15 NOTE — ED Provider Notes (Signed)
 Emergency Department Provider Note  TRIAGE NOTE: Pt with c/o SOB and difficulty breathing x 5 days. States has congested cough that started yesterday. Denies any known sick contacts. States was seen at Urgent Care today and was negative for FLU and Covid. States no chest Xray was done because their machine was broke. Pt given an Inhaler and told to take OTC cough meds. Pt ambulatory to triage.   HISTORY  Chief Complaint Shortness of Breath and Cough   HPI David Brooks is a 58 y.o. male with  a chief complaint of progressive shortness of breath over the past five days. 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 Yesterday, he developed a cough productive of small amounts of sputum, though he denies fever. He reports a sensation of chest congestion. The patient visited urgent care earlier today, where he tested negative for COVID-19 and influenza. He denies any recent sick contacts. His medical history is significant for hypertension, for which he was recently switched to a combination medication including amlodipine, but he ran out and was without it for five days until today. He denies any history of heart failure, though he is under evaluation by a cardiologist due to persistently high blood pressure. He has no history of smoking, alcohol, or drug use. He has no known family history of heart disease or stroke. The patient has a known allergy to penicillin and amoxicillin. He is currently on several medications, including aspirin, atenolol, chlorthalidone, duloxetine, losartan with hydrochlorothiazide, pantoprazole as needed, and testosterone . History was obtained from the patient and his wife.  PMH Past Medical History:  Diagnosis Date   Diabetes mellitus without complication (HCC)    Hypertension    Rotator cuff (capsule) sprain     Home Medications Prior to Admission  medications   Medication Sig Start Date End Date Taking? Authorizing Provider  amLODipine (NORVASC) 10 MG tablet Take 10 mg by mouth daily.    [provider]  aspirin EC 81 MG tablet Take 81 mg by mouth daily.    [provider]  DULoxetine (CYMBALTA) 60 MG capsule TAKE 1 CAPSULE BY MOUTH ONCE DAILY FOR 1 2 WEEKS. THEN INCREASE THE DOSE TO ONE CAPSULE TWICE DAILY FOR CHRONIC PAIN 12/15/17   [provider]  losartan-hydrochlorothiazide (HYZAAR) 100-25 MG tablet Take 1 tablet by mouth daily. 08/15/23   [provider]  pantoprazole (PROTONIX) 40 MG tablet Take 1 tablet (40 mg total) by mouth as needed. 03/15/24   Kaydra Borgen, Selinda, MD  tadalafil  (CIALIS ) 20 MG tablet SMARTSIG:1 Tablet(s) By Mouth 04/29/22   [provider]  testosterone  cypionate (DEPOTESTOSTERONE CYPIONATE) 200 MG/ML injection Inject 200 mg into the muscle every 14 (fourteen) days. 05/02/22   [provider]    Social History Social History   Tobacco Use   Smoking status: Never   Smokeless tobacco: Never  Substance Use Topics   Alcohol use: No   Drug use: No    Review of Systems: Documented in HPI ____________________________________________  PHYSICAL EXAM: VITAL SIGNS: Triage: There were no vitals taken for this visit.  Vitals:   03/15/24 0126 03/15/24 0130 03/15/24 0234 03/15/24 0237  BP:  (!) 156/93    Pulse:  81 78 78  Resp:  (!) 23 15 19   Temp:      TempSrc:      SpO2:  (!) 87% (!) 87% 92%  Weight: ROLLEN)  139.7 kg     Height: 5' 11 (1.803 m)       Physical Exam Vitals and nursing note reviewed.  Constitutional:      Appearance: He is well-developed.  HENT:     Head: Normocephalic and atraumatic.  Cardiovascular:     Rate and Rhythm: Normal rate.  Pulmonary:     Effort: Pulmonary effort is normal. Tachypnea present. No respiratory distress.     Breath sounds: Decreased breath sounds and rales present.     Comments: 88-93% on RA w/ good pleth in  different situations during interview Abdominal:     General: There is no distension.  Musculoskeletal:        General: Normal range of motion.     Cervical back: Normal range of motion.     Right lower leg: No tenderness. No edema.     Left lower leg: No tenderness. No edema.  Skin:    General: Skin is warm and dry.  Neurological:     Mental Status: He is alert.       ____________________________________________   LABS (all labs ordered are listed, but only abnormal results are displayed)  Labs Reviewed  CBC WITH DIFFERENTIAL/PLATELET  COMPREHENSIVE METABOLIC PANEL WITH GFR  PRO BRAIN NATRIURETIC PEPTIDE  TROPONIN T, HIGH SENSITIVITY   ____________________________________________  EKG   EKG Interpretation Date/Time:    Ventricular Rate:    PR Interval:    QRS Duration:    QT Interval:    QTC Calculation:   R Axis:      Text Interpretation:          ____________________________________________  RADIOLOGY  DG Chest 2 View Result Date: 03/15/2024 EXAM: 2 VIEW(S) XRAY OF THE CHEST 03/15/2024 01:45:10 AM COMPARISON: None available. CLINICAL HISTORY: eval for cough FINDINGS: LUNGS AND PLEURA: There are moderately dense perihilar alveolar opacities extending outward with peripheral sparing. There are trace pleural effusions but no large collection. No pneumothorax. There is a moderately elevated right hemidiaphragm consistent with paresis or eventration. HEART AND MEDIASTINUM: The heart is at the upper limit of normal in size. Central vessels are not well seen due to superimposed airspace disease. The mediastinum is normally outlined. BONES AND SOFT TISSUES: Thoracic cage intact with mild thoracic dextroscoliosis. IMPRESSION: 1. Moderately dense perihilar opacities with peripheral sparing, consistent with airspace disease such as due to pneumonia, edema, or inhalational injury such as due to vaping . 2. Trace pleural effusions. 3. Moderately elevated right hemidiaphragm,  consistent with paresis or eventration. Electronically signed by: Francis Quam MD 03/15/2024 01:55 AM EST RP Workstation: [id number]   ____________________________________________  PROCEDURES  Procedure(s) performed:   Procedures ____________________________________________  INITIAL IMPRESSION / ASSESSMENT AND PLAN   Clinical Course as of 03/15/24 0241  Sun Mar 15, 2024  0206 Initial Evaluation:  Patient presents with a five-day history of progressive shortness of breath, recent onset of cough, and sensation of chest congestion.  The differential diagnosis includes pneumonia, heart failure, pulmonary embolism, bronchitis. The patient's chest X-ray shows abnormalities suggestive of fluid in the lungs, which could indicate either pneumonia or heart failure. The absence of fever and the symmetric nature of the lung findings make pneumonia less likely, though it cannot be ruled out entirely. The patient's oxygen saturation is borderline, dropping below 90% during conversation, which raises concern for possible heart failure given the history of uncontrolled hypertension and the presence of fluid sounds in the lungs.  Plan:  BNP/Troponin/ECG to evaluate cardiac function. Provide symptomatic relief with cough  medicine. Consider antibiotics if infection is indicated by blood tests. Consider a different treatment approach if heart failure is suggested. Monitor patient closely and determine further disposition based on lab findings and responses to treatment [JM]  0208 DG Chest 2 View CXR done and showed diffuse opacities, elevated R hemidaphragm, no obvious effusions (independently viewed and interpreted by myself and radiology read reviewed). Radiologist did note small pleural effusions however  [JM]  0209 BP(!): 173/96 [JM]    Clinical Course User Index [JM] Hanish Laraia, Selinda, MD     Images ordered viewed and obtained by myself. Agree with Radiology interpretation. Details in ED course.   Labs ordered reviewed by myself as detailed in ED course.  Consultations obtained/considered detailed in ED course.    CRITICAL INTERVENTIONS:  ***  ***   FINAL IMPRESSION Final diagnoses:  None     Disposition ***Medical screening exam was performed and I feel the patient has had appropriate emergency department evaluation and work-up for their chief complaint and is stable for ADMISSION to the hospital at this time.  I discussed with *** with the *** service and discussed labs, imaging and other work-up in the emergency room.  They agree to admission for further management and work-up of said condition. *** A medical screening exam was performed and I feel the patient has had an appropriate workup for their chief complaint at this time and likelihood of emergent condition existing is low. They have been counseled on decision, DISCHARGE, follow up and which symptoms necessitate immediate return to the emergency department. They or their family verbally stated understanding and agreement with plan and discharged in stable condition.   ____________________________________________   NEW OUTPATIENT MEDICATIONS STARTED DURING THIS VISIT:  Current Discharge Medication List      Note:  This note was prepared with assistance of Dragon voice recognition software. Occasional wrong-word or sound-a-like substitutions may have occurred due to the inherent limitations of voice recognition software.

## 2024-03-15 NOTE — Discharge Instructions (Signed)

## 2024-03-15 NOTE — ED Triage Notes (Signed)
 Pt with c/o SOB and difficulty breathing x 5 days. States has congested cough that started yesterday. Denies any known sick contacts. States was seen at Urgent Care today and was negative for FLU and Covid. States no chest Xray was done because their machine was broke. Pt given an Inhaler and told to take OTC cough meds. Pt ambulatory to triage.

## 2024-03-15 NOTE — Progress Notes (Signed)
 PROGRESS NOTE    David Brooks  FMW:[id number] DOB: 1965-10-05 DOA: 03/15/2024 PCP: Pcp, No   Brief Narrative:   58 year old male with history of hypertension, OSA noncompliant with CPAP, obesity who presents to the ER with complaints of dyspnea on exertion, coughing.  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  Patient denies sick contacts.  Patient has seen cardiologist for management of hypertension.  Patient is also diagnosed with diabetes with last hemoglobin A1c reportedly 8.1 but not on any medications.  In the ER, he was satting 87% on room air, blood pressure in 150s to 160s systolic.  Chest x-ray revealed moderately dense perihilar opacities with peripheral sparing, consistent airspace disease such as pneumonia, edema or inhalational injury.  Patient denies smoking or vaping.  Moderately elevated right hemidiaphragm, consistent with varices or eventration.  Patient also reports of increasing body weight recently. Admitted due to concern for congestive heart failure.  proBNP 412, troponin T 28 and flat, procalcitonin less than 0.10.  COVID-19, RSV, influenza A and B-.  Assessment & Plan:   Principal Problem:   Acute diastolic heart failure (HCC) Active Problems:   DM (diabetes mellitus) (HCC)   Essential hypertension, benign   Obstructive sleep apnea   Shortness of breath   Pulmonary edema   Acute diastolic heart failure - Will continue with Lasix 40 mg IV twice daily with close monitoring of electrolytes and kidney function, intake and output, daily weights -Replace potassium - Echocardiogram is pending - Mild troponin elevation likely from CHF. - Procalcitonin less than 0.10, unlikely bacterial infection.  Will continue to monitor off antibiotics.  Acute respiratory failure with hypoxia -titrate oxygen keep sats above 92% -noted flu covid negative at urgent  care but due to xray, get 20 pathogen resp panel -CPAP for OSA -noncompliant, needs further education and encouragement for CPAP use   Diabetes type 2 .-not currently on management med, last A1C 4 months ago per patient was 8.1 -glucose above 200s today  -ACHS SSI   Hypertension -continue norvasc 10, losartan 100 and hydrochlorothiazide 25 daily   Chronic shoulder pain - continue cymbalta -prn percocet   .      Advance Care Planning:   Code Status: Full Code    Consults: cardiology consult needed   Family Communication: none present   Severity of Illness: The appropriate patient status for this patient is INPATIENT. Inpatient status is judged to be reasonable and necessary in order to provide the required intensity of service to ensure the patient's safety. The patient's presenting symptoms, physical exam findings, and initial radiographic and laboratory data in the context of their chronic comorbidities is felt to place them at high risk for further clinical deterioration. Furthermore, it is not anticipated that the patient will be medically stable for discharge from the hospital within 2 midnights of admission.    * I certify that at the point of admission it is my clinical judgment that the patient will require inpatient hospital care spanning beyond 2 midnights from the point of admission due to high intensity of service, high risk for further deterioration and high frequency of surveillance required.*    Subjective:  Patient seen and examined at the bedside.  He is not in any acute distress.  Feels somewhat better than yesterday.  Blood pressure is in 150s systolic.  Afebrile.  On supplemental oxygen.  Echocardiogram is pending  Objective:  Vitals:   03/15/24 0500 03/15/24 0553 03/15/24 0740 03/15/24 0845  BP: (!) 158/88     Pulse: 76  72 69  Resp: 16  12 20   Temp:  98.2 F (36.8 C)    TempSrc:  Oral    SpO2: 95%  95% 94%  Weight:      Height:        Intake/Output  Summary (Last 24 hours) at 03/15/2024 1101 Last data filed at 03/15/2024 0415 Gross per 24 hour  Intake --  Output 600 ml  Net -600 ml   Filed Weights   03/15/24 0126  Weight: (!) 139.7 kg    Examination:  General: Alert, oriented not in any acute distress HEENT: Moist oral mucosa Chest: Mild crackles bilaterally CVs: S1, S2, no murmur, regular rhythm Abdomen: Soft, obese Extremities: Mild edema peripherally.  Data Reviewed: I have personally reviewed following labs and imaging studies  CBC: Recent Labs  Lab 03/15/24 0233 03/15/24 0553  WBC 9.3 10.2  NEUTROABS 7.2  --   HGB 14.7 14.6  HCT 43.0 43.1  MCV 84.8 86.0  PLT 223 220   Basic Metabolic Panel: Recent Labs  Lab 03/15/24 0233 03/15/24 0553 03/15/24 0807  NA 134*  --   --   K 3.5  --  3.2*  CL 97*  --   --   CO2 25  --   --   GLUCOSE 271*  --   --   BUN 14  --   --   CREATININE 1.19 1.29*  --   CALCIUM 8.4*  --   --    GFR: Estimated Creatinine Clearance: 90.3 mL/min (A) (by C-G formula based on SCr of 1.29 mg/dL (H)). Liver Function Tests: Recent Labs  Lab 03/15/24 0233  AST 32  ALT 16  ALKPHOS 46  BILITOT 0.4  PROT 6.7  ALBUMIN 3.7   No results for input(s): LIPASE, AMYLASE in the last 168 hours. No results for input(s): AMMONIA in the last 168 hours. Coagulation Profile: No results for input(s): INR, PROTIME in the last 168 hours. Cardiac Enzymes: No results for input(s): CKTOTAL, CKMB, CKMBINDEX, TROPONINI in the last 168 hours. BNP (last 3 results) Recent Labs    03/15/24 0233  PROBNP 412.0*   HbA1C: No results for input(s): HGBA1C in the last 72 hours. CBG: Recent Labs  Lab 03/15/24 0723  GLUCAP 198*   Lipid Profile: No results for input(s): CHOL, HDL, LDLCALC, TRIG, CHOLHDL, LDLDIRECT in the last 72 hours. Thyroid Function Tests: No results for input(s): TSH, T4TOTAL, FREET4, T3FREE, THYROIDAB in the last 72 hours. Anemia  Panel: No results for input(s): VITAMINB12, FOLATE, FERRITIN, TIBC, IRON, RETICCTPCT in the last 72 hours. Sepsis Labs: Recent Labs  Lab 03/15/24 0553  PROCALCITON <0.10    Recent Results (from the past 240 hours)  Respiratory (~20 pathogens) panel by PCR     Status: None   Collection Time: 03/15/24  4:20 AM   Specimen: Nasopharyngeal Swab; Respiratory  Result Value Ref Range Status   Adenovirus NOT DETECTED NOT DETECTED Final   Coronavirus 229E NOT DETECTED NOT DETECTED Final    Comment: (NOTE) The Coronavirus on the Respiratory Panel, DOES NOT test for the novel  Coronavirus (2019 nCoV)    Coronavirus HKU1 NOT DETECTED NOT DETECTED Final   Coronavirus NL63 NOT DETECTED NOT DETECTED Final   Coronavirus OC43 NOT DETECTED NOT DETECTED Final   Metapneumovirus NOT DETECTED NOT DETECTED Final   Rhinovirus / Enterovirus NOT DETECTED NOT DETECTED  Final   Influenza A NOT DETECTED NOT DETECTED Final   Influenza B NOT DETECTED NOT DETECTED Final   Parainfluenza Virus 1 NOT DETECTED NOT DETECTED Final   Parainfluenza Virus 2 NOT DETECTED NOT DETECTED Final   Parainfluenza Virus 3 NOT DETECTED NOT DETECTED Final   Parainfluenza Virus 4 NOT DETECTED NOT DETECTED Final   Respiratory Syncytial Virus NOT DETECTED NOT DETECTED Final   Bordetella pertussis NOT DETECTED NOT DETECTED Final   Bordetella Parapertussis NOT DETECTED NOT DETECTED Final   Chlamydophila pneumoniae NOT DETECTED NOT DETECTED Final   Mycoplasma pneumoniae NOT DETECTED NOT DETECTED Final    Comment: Performed at [facility], [address], [place], [place] [address]         Radiology Studies: DG Chest 2 View Result Date: 03/15/2024 EXAM: 2 VIEW(S) XRAY OF THE CHEST 03/15/2024 01:45:10 AM COMPARISON: None available. CLINICAL HISTORY: eval for cough FINDINGS: LUNGS AND PLEURA: There are moderately dense perihilar alveolar opacities extending outward with peripheral sparing. There are trace pleural  effusions but no large collection. No pneumothorax. There is a moderately elevated right hemidiaphragm consistent with paresis or eventration. HEART AND MEDIASTINUM: The heart is at the upper limit of normal in size. Central vessels are not well seen due to superimposed airspace disease. The mediastinum is normally outlined. BONES AND SOFT TISSUES: Thoracic cage intact with mild thoracic dextroscoliosis. IMPRESSION: 1. Moderately dense perihilar opacities with peripheral sparing, consistent with airspace disease such as due to pneumonia, edema, or inhalational injury such as due to vaping . 2. Trace pleural effusions. 3. Moderately elevated right hemidiaphragm, consistent with paresis or eventration. Electronically signed by: Francis Quam MD 03/15/2024 01:55 AM EST RP Workstation: [id number]        Scheduled Meds:  amLODipine  10 mg Oral Daily   DULoxetine  60 mg Oral Daily   enoxaparin (LOVENOX) injection  40 mg Subcutaneous Q24H   furosemide  40 mg Intravenous BID   insulin aspart  0-15 Units Subcutaneous TID AC & HS   losartan  100 mg Oral Daily   Continuous Infusions:        Mikya Don, MD [facility] 03/15/2024, 11:01 AM

## 2024-03-15 NOTE — Progress Notes (Signed)
  Echocardiogram 2D Echocardiogram has been performed.  Koleen KANDICE Popper, RDCS 03/15/2024, 2:31 PM

## 2024-03-15 NOTE — Plan of Care (Signed)
  Problem: Education: Goal: Ability to describe self-care measures that may prevent or decrease complications (Diabetes Survival Skills Education) will improve Outcome: Progressing Goal: Individualized Educational Video(s) Outcome: Progressing   Problem: Coping: Goal: Ability to adjust to condition or change in health will improve Outcome: Progressing   Problem: Fluid Volume: Goal: Ability to maintain a balanced intake and output will improve Outcome: Progressing   Problem: Health Behavior/Discharge Planning: Goal: Ability to identify and utilize available resources and services will improve Outcome: Progressing Goal: Ability to manage health-related needs will improve Outcome: Progressing   Problem: Metabolic: Goal: Ability to maintain appropriate glucose levels will improve Outcome: Progressing   Problem: Nutritional: Goal: Maintenance of adequate nutrition will improve Outcome: Progressing Goal: Progress toward achieving an optimal weight will improve Outcome: Progressing   Problem: Skin Integrity: Goal: Risk for impaired skin integrity will decrease Outcome: Progressing   Problem: Tissue Perfusion: Goal: Adequacy of tissue perfusion will improve Outcome: Progressing   Problem: Education: Goal: Knowledge of General Education information will improve Description: Including pain rating scale, medication(s)/side effects and non-pharmacologic comfort measures Outcome: Progressing   Problem: Health Behavior/Discharge Planning: Goal: Ability to manage health-related needs will improve Outcome: Progressing   Problem: Clinical Measurements: Goal: Ability to maintain clinical measurements within normal limits will improve Outcome: Progressing Goal: Will remain free from infection Outcome: Progressing Goal: Diagnostic test results will improve Outcome: Progressing Goal: Respiratory complications will improve Outcome: Progressing Goal: Cardiovascular complication will  be avoided Outcome: Progressing   Problem: Activity: Goal: Risk for activity intolerance will decrease Outcome: Progressing   Problem: Nutrition: Goal: Adequate nutrition will be maintained Outcome: Progressing   Problem: Coping: Goal: Level of anxiety will decrease Outcome: Progressing   Problem: Elimination: Goal: Will not experience complications related to bowel motility Outcome: Progressing Goal: Will not experience complications related to urinary retention Outcome: Progressing   Problem: Pain Managment: Goal: General experience of comfort will improve and/or be controlled Outcome: Progressing   Problem: Safety: Goal: Ability to remain free from injury will improve Outcome: Progressing   Problem: Skin Integrity: Goal: Risk for impaired skin integrity will decrease Outcome: Progressing   Problem: Education: Goal: Ability to demonstrate management of disease process will improve Outcome: Progressing Goal: Ability to verbalize understanding of medication therapies will improve Outcome: Progressing Goal: Individualized Educational Video(s) Outcome: Progressing

## 2024-03-15 NOTE — Progress Notes (Addendum)
 PCP list added.    Inpatient Care Manager [facility]) has reviewed patient and no ICM needs have been identified at this time. We will continue to monitor patient advancement through interdisciplinary progression rounds. If new patient transition needs arise, please place a ICM consult.     03/15/24 1451  TOC Brief Assessment  Insurance and Status Reviewed  Patient has primary care physician No (PCP list added)  Home environment has been reviewed Home  Prior level of function: Independent  Prior/Current Home Services No current home services  Social Drivers of Health Review SDOH reviewed no interventions necessary  Readmission risk has been reviewed Yes  Transition of care needs no transition of care needs at this time

## 2024-03-15 NOTE — Progress Notes (Addendum)
 Patient refused CPAP; patient stated he hasn't worn a CPAP in several years after a sleep study. Patient stated when he did he didn't use for long before returning device; RT has one on standby and patient on 3lpm [place].

## 2024-03-16 ENCOUNTER — Other Ambulatory Visit (HOSPITAL_COMMUNITY): Payer: Self-pay

## 2024-03-16 DIAGNOSIS — R0602 Shortness of breath: Secondary | ICD-10-CM

## 2024-03-16 DIAGNOSIS — I5031 Acute diastolic (congestive) heart failure: Secondary | ICD-10-CM | POA: Diagnosis not present

## 2024-03-16 DIAGNOSIS — I10 Essential (primary) hypertension: Secondary | ICD-10-CM | POA: Diagnosis not present

## 2024-03-16 LAB — GLUCOSE, CAPILLARY
Glucose-Capillary: 142 mg/dL — ABNORMAL HIGH (ref 70–99)
Glucose-Capillary: 187 mg/dL — ABNORMAL HIGH (ref 70–99)
Glucose-Capillary: 122 mg/dL — ABNORMAL HIGH (ref 70–99)
Glucose-Capillary: 187 mg/dL — ABNORMAL HIGH (ref 70–99)

## 2024-03-16 LAB — BASIC METABOLIC PANEL WITH GFR
Anion gap: 12 (ref 5–15)
BUN: 14 mg/dL (ref 6–20)
CO2: 27 mmol/L (ref 22–32)
Calcium: 8.8 mg/dL — ABNORMAL LOW (ref 8.9–10.3)
Chloride: 99 mmol/L (ref 98–111)
Creatinine, Ser: 1.22 mg/dL (ref 0.61–1.24)
GFR, Estimated: >60 mL/min (ref >60)
Glucose, Bld: 144 mg/dL — ABNORMAL HIGH (ref 70–99)
Potassium: 3 mmol/L — ABNORMAL LOW (ref 3.5–5.1)
Sodium: 137 mmol/L (ref 135–145)

## 2024-03-16 LAB — HEMOGLOBIN A1C
Hgb A1c MFr Bld: 9.3 % — ABNORMAL HIGH (ref 4.8–5.6)
Mean Plasma Glucose: 220 mg/dL

## 2024-03-16 MED ORDER — POTASSIUM CHLORIDE CRYS ER 20 MEQ PO TBCR
40.0000 meq | EXTENDED_RELEASE_TABLET | Freq: Two times a day (BID) | ORAL | Status: AC
  Administered 2024-03-16 – 2024-03-17 (×3): 40 meq via ORAL
  Filled 2024-03-16 (×3): qty 2

## 2024-03-16 NOTE — Progress Notes (Signed)
 PROGRESS NOTE    David Brooks  FMW:[id number] DOB: 1965/11/23 DOA: 03/15/2024 PCP: Pcp, No   Brief Narrative:   58 year old male with history of hypertension, OSA noncompliant with CPAP, obesity who presents to the ER with complaints of dyspnea on exertion, coughing.  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  Patient denies sick contacts.  Patient has seen cardiologist for management of hypertension.  Patient is also diagnosed with diabetes with last hemoglobin A1c reportedly 8.1 but not on any medications.  In the ER, he was satting 87% on room air, blood pressure in 150s to 160s systolic.  Chest x-ray revealed moderately dense perihilar opacities with peripheral sparing, consistent airspace disease such as pneumonia, edema or inhalational injury.  Patient denies smoking or vaping.  Moderately elevated right hemidiaphragm, consistent with varices or eventration.  Patient also reports of increasing body weight recently. Admitted due to concern for congestive heart failure.  proBNP 412, troponin T 28 and flat, procalcitonin less than 0.10.  COVID-19, RSV, influenza A and B-.  Assessment & Plan:   Principal Problem:   Acute diastolic heart failure (HCC) Active Problems:   DM (diabetes mellitus) (HCC)   Essential hypertension, benign   Obstructive sleep apnea   Shortness of breath   Pulmonary edema   Acute diastolic heart failure/volume overload -Significantly improving with IV diuresis. - Will continue with Lasix 40 mg IV twice daily with close monitoring of electrolytes and kidney function, intake and output, daily weights -Replace potassium as needed - Echocardiogram shows LVEF 55 to 60%, grade 1 diastolic dysfunction. -Patient cardiology input.  Continue Farxiga, losartan - Mild troponin elevation likely from CHF. - Procalcitonin less than 0.10, unlikely bacterial  infection.  Viral respiratory panel is negative. Will continue to monitor off antibiotics.  Acute respiratory failure with hypoxia -titrate oxygen keep sats above 92% -noted flu covid negative at urgent care but due to xray, get 20 pathogen resp panel -CPAP for OSA -noncompliant, needs further education and encouragement for CPAP use   Diabetes type 2 .-not currently on management med, last A1C 4 months ago per patient was 8.1 -glucose above 200s today  -ACHS SSI   Hypertension -continue norvasc 10, losartan 100. Resume hydrochlorothiazide 25 daily on dc   Chronic shoulder pain - continue cymbalta -prn percocet   .      Advance Care Planning:   Code Status: Full Code    Consults: cardiology consult    Family Communication: none present  Patient: Anticipate discharge home tomorrow   Severity of Illness: The appropriate patient status for this patient is INPATIENT. Inpatient status is judged to be reasonable and necessary in order to provide the required intensity of service to ensure the patient's safety. The patient's presenting symptoms, physical exam findings, and initial radiographic and laboratory data in the context of their chronic comorbidities is felt to place them at high risk for further clinical deterioration. Furthermore, it is not anticipated that the patient will be medically stable for discharge from the hospital within 2 midnights of admission.    * I certify that at the point of admission it is my clinical judgment that the patient will require inpatient hospital care spanning beyond 2 midnights from the point of admission due to high intensity of service, high risk for further deterioration and high frequency of surveillance required.*    Subjective:  Patient seen and examined at  the bedside.  He feels much better.  He still on supplemental oxygen.  Denies chest pain or shortness of breath.  Objective: Vitals:   03/16/24 0505 03/16/24 0733 03/16/24 0753  03/16/24 1329  BP: (!) 148/81   (!) 142/68  Pulse: 83   91  Resp: 18   19  Temp: 98.2 F (36.8 C)   98.3 F (36.8 C)  TempSrc: Oral   Oral  SpO2: 96% 99%  92%  Weight:   133.5 kg   Height:        Intake/Output Summary (Last 24 hours) at 03/16/2024 1512 Last data filed at 03/16/2024 0610 Gross per 24 hour  Intake 960 ml  Output 2800 ml  Net -1840 ml   Filed Weights   03/15/24 0126 03/15/24 1147 03/16/24 0753  Weight: (!) 139.7 kg 135.8 kg 133.5 kg    Examination:  General: Alert, oriented not in any acute distress HEENT: Moist oral mucosa Chest: Mild crackles bilaterally CVs: S1, S2, no murmur, regular rhythm Abdomen: Soft, obese Extremities: Mild edema peripherally.  Data Reviewed: I have personally reviewed following labs and imaging studies  CBC: Recent Labs  Lab 03/15/24 0233 03/15/24 0553  WBC 9.3 10.2  NEUTROABS 7.2  --   HGB 14.7 14.6  HCT 43.0 43.1  MCV 84.8 86.0  PLT 223 220   Basic Metabolic Panel: Recent Labs  Lab 03/15/24 0233 03/15/24 0553 03/15/24 0807 03/16/24 0458  NA 134*  --   --  137  K 3.5  --  3.2* 3.0*  CL 97*  --   --  99  CO2 25  --   --  27  GLUCOSE 271*  --   --  144*  BUN 14  --   --  14  CREATININE 1.19 1.29*  --  1.22  CALCIUM 8.4*  --   --  8.8*   GFR: Estimated Creatinine Clearance: 93.2 mL/min (by C-G formula based on SCr of 1.22 mg/dL). Liver Function Tests: Recent Labs  Lab 03/15/24 0233  AST 32  ALT 16  ALKPHOS 46  BILITOT 0.4  PROT 6.7  ALBUMIN 3.7   No results for input(s): LIPASE, AMYLASE in the last 168 hours. No results for input(s): AMMONIA in the last 168 hours. Coagulation Profile: No results for input(s): INR, PROTIME in the last 168 hours. Cardiac Enzymes: No results for input(s): CKTOTAL, CKMB, CKMBINDEX, TROPONINI in the last 168 hours. BNP (last 3 results) Recent Labs    03/15/24 0233  PROBNP 412.0*   HbA1C: Recent Labs    03/15/24 0553  HGBA1C 9.3*    CBG: Recent Labs  Lab 03/15/24 1141 03/15/24 1700 03/15/24 2019 03/16/24 0728 03/16/24 1057  GLUCAP 224* 123* 227* 142* 187*   Lipid Profile: No results for input(s): CHOL, HDL, LDLCALC, TRIG, CHOLHDL, LDLDIRECT in the last 72 hours. Thyroid Function Tests: No results for input(s): TSH, T4TOTAL, FREET4, T3FREE, THYROIDAB in the last 72 hours. Anemia Panel: No results for input(s): VITAMINB12, FOLATE, FERRITIN, TIBC, IRON, RETICCTPCT in the last 72 hours. Sepsis Labs: Recent Labs  Lab 03/15/24 0553  PROCALCITON <0.10    Recent Results (from the past 240 hours)  Respiratory (~20 pathogens) panel by PCR     Status: None   Collection Time: 03/15/24  4:20 AM   Specimen: Nasopharyngeal Swab; Respiratory  Result Value Ref Range Status   Adenovirus NOT DETECTED NOT DETECTED Final   Coronavirus 229E NOT DETECTED NOT DETECTED Final    Comment: (NOTE) The Coronavirus  on the Respiratory Panel, DOES NOT test for the novel  Coronavirus (2019 nCoV)    Coronavirus HKU1 NOT DETECTED NOT DETECTED Final   Coronavirus NL63 NOT DETECTED NOT DETECTED Final   Coronavirus OC43 NOT DETECTED NOT DETECTED Final   Metapneumovirus NOT DETECTED NOT DETECTED Final   Rhinovirus / Enterovirus NOT DETECTED NOT DETECTED Final   Influenza A NOT DETECTED NOT DETECTED Final   Influenza B NOT DETECTED NOT DETECTED Final   Parainfluenza Virus 1 NOT DETECTED NOT DETECTED Final   Parainfluenza Virus 2 NOT DETECTED NOT DETECTED Final   Parainfluenza Virus 3 NOT DETECTED NOT DETECTED Final   Parainfluenza Virus 4 NOT DETECTED NOT DETECTED Final   Respiratory Syncytial Virus NOT DETECTED NOT DETECTED Final   Bordetella pertussis NOT DETECTED NOT DETECTED Final   Bordetella Parapertussis NOT DETECTED NOT DETECTED Final   Chlamydophila pneumoniae NOT DETECTED NOT DETECTED Final   Mycoplasma pneumoniae NOT DETECTED NOT DETECTED Final    Comment: Performed at [facility], [address] [place], [place] [address]         Radiology Studies: ECHOCARDIOGRAM COMPLETE BUBBLE STUDY Result Date: 03/15/2024    ECHOCARDIOGRAM REPORT   Patient Name:   David Brooks Date of Exam: 03/15/2024 Medical Rec #:  [id number]       Height:       71.0 in Accession #:    [id number]      Weight:       308.0 lb Date of Birth:  1965/08/26       BSA:          2.533 m Patient Age:    58 years        BP:           168/96 mmHg Patient Gender: M               HR:           73 bpm. Exam Location:  [facility] Procedure: 2D Echo, Cardiac Doppler, Color Doppler and Saline Contrast Bubble            Study (Both Spectral and Color Flow Doppler were utilized during            procedure). Indications:    Acute Diastolic Heart Failure (HCC)  History:        Patient has no prior history of Echocardiogram examinations.                 Signs/Symptoms:Shortness of Breath, Dyspnea and Edema; Risk                 Factors:Hypertension, Sleep Apnea and Diabetes.  Sonographer:    Koleen Popper RDCS Referring Phys: [id number] BRENDA MORRISON IMPRESSIONS  1. Left ventricular ejection fraction, by estimation, is 55 to 60%. The left ventricle has normal function. The left ventricle has no regional wall motion abnormalities. There is mild concentric left ventricular hypertrophy. Left ventricular diastolic parameters are consistent with Grade I diastolic dysfunction (impaired relaxation).  2. Right ventricular systolic function is normal. The right ventricular size is normal. There is normal pulmonary artery systolic pressure. The estimated right ventricular systolic pressure is 32.2 mmHg.  3. Left atrial size was moderately dilated.  4. The mitral valve is normal in structure. No evidence of mitral valve regurgitation. No evidence of mitral stenosis.  5. The aortic valve is tricuspid. Aortic valve regurgitation is mild. No aortic stenosis is present.  6. Agitated saline contrast bubble study was  negative, with no  evidence of any interatrial shunt. FINDINGS  Left Ventricle: Left ventricular ejection fraction, by estimation, is 55 to 60%. The left ventricle has normal function. The left ventricle has no regional wall motion abnormalities. The left ventricular internal cavity size was normal in size. There is  mild concentric left ventricular hypertrophy. Left ventricular diastolic parameters are consistent with Grade I diastolic dysfunction (impaired relaxation). Right Ventricle: The right ventricular size is normal. No increase in right ventricular wall thickness. Right ventricular systolic function is normal. There is normal pulmonary artery systolic pressure. The tricuspid regurgitant velocity is 2.70 m/s, and  with an assumed right atrial pressure of 3 mmHg, the estimated right ventricular systolic pressure is 32.2 mmHg. Left Atrium: Left atrial size was moderately dilated. Right Atrium: Right atrial size was normal in size. Pericardium: There is no evidence of pericardial effusion. Mitral Valve: The mitral valve is normal in structure. There is mild thickening of the anterior mitral valve leaflet(s). There is mild calcification of the anterior mitral valve leaflet(s). No evidence of mitral valve regurgitation. No evidence of mitral  valve stenosis. Tricuspid Valve: The tricuspid valve is normal in structure. Tricuspid valve regurgitation is trivial. Aortic Valve: The aortic valve is tricuspid. Aortic valve regurgitation is mild. Aortic regurgitation PHT measures 510 msec. No aortic stenosis is present. Pulmonic Valve: The pulmonic valve was grossly normal. Pulmonic valve regurgitation is not visualized. No evidence of pulmonic stenosis. Aorta: The aortic root is normal in size and structure. IAS/Shunts: The interatrial septum was not well visualized. Agitated saline contrast was given intravenously to evaluate for intracardiac shunting. Agitated saline contrast bubble study was negative, with no evidence of any  interatrial shunt.  LEFT VENTRICLE PLAX 2D LVIDd:         4.60 cm   Diastology LVIDs:         3.50 cm   LV e' medial:    4.68 cm/s LV PW:         1.50 cm   LV E/e' medial:  14.4 LV IVS:        1.40 cm   LV e' lateral:   6.31 cm/s LVOT diam:     2.10 cm   LV E/e' lateral: 10.7 LV SV:         63 LV SV Index:   25 LVOT Area:     3.46 cm  IVC IVC diam: 2.30 cm LEFT ATRIUM             Index LA diam:        5.10 cm 2.01 cm/m LA Vol (A2C):   43.2 ml 17.05 ml/m LA Vol (A4C):   71.0 ml 28.03 ml/m LA Biplane Vol: 56.5 ml 22.30 ml/m  AORTIC VALVE LVOT Vmax:   102.00 cm/s LVOT Vmean:  66.400 cm/s LVOT VTI:    0.183 m AI PHT:      510 msec  AORTA Ao Root diam: 3.30 cm MITRAL VALVE               TRICUSPID VALVE MV Area (PHT): 3.60 cm    TR Peak grad:   29.2 mmHg MV Decel Time: 211 msec    TR Vmax:        270.00 cm/s MV E velocity: 67.30 cm/s MV A velocity: 76.60 cm/s  SHUNTS MV E/A ratio:  0.88        Systemic VTI:  0.18 m  Systemic Diam: 2.10 cm Jerel Balding MD Electronically signed by Jerel Balding MD Signature Date/Time: 03/15/2024/4:10:37 PM    Final    DG Chest 2 View Result Date: 03/15/2024 EXAM: 2 VIEW(S) XRAY OF THE CHEST 03/15/2024 01:45:10 AM COMPARISON: None available. CLINICAL HISTORY: eval for cough FINDINGS: LUNGS AND PLEURA: There are moderately dense perihilar alveolar opacities extending outward with peripheral sparing. There are trace pleural effusions but no large collection. No pneumothorax. There is a moderately elevated right hemidiaphragm consistent with paresis or eventration. HEART AND MEDIASTINUM: The heart is at the upper limit of normal in size. Central vessels are not well seen due to superimposed airspace disease. The mediastinum is normally outlined. BONES AND SOFT TISSUES: Thoracic cage intact with mild thoracic dextroscoliosis. IMPRESSION: 1. Moderately dense perihilar opacities with peripheral sparing, consistent with airspace disease such as due to  pneumonia, edema, or inhalational injury such as due to vaping . 2. Trace pleural effusions. 3. Moderately elevated right hemidiaphragm, consistent with paresis or eventration. Electronically signed by: Francis Quam MD 03/15/2024 01:55 AM EST RP Workstation: [id number]        Scheduled Meds:  amLODipine  10 mg Oral Daily   aspirin EC  81 mg Oral Daily   dapagliflozin propanediol  10 mg Oral Daily   DULoxetine  60 mg Oral Daily   enoxaparin (LOVENOX) injection  40 mg Subcutaneous Q24H   furosemide  40 mg Intravenous BID   insulin aspart  0-15 Units Subcutaneous TID AC & HS   losartan  100 mg Oral Daily   potassium chloride  40 mEq Oral BID   umeclidinium bromide  1 puff Inhalation Daily   Continuous Infusions:        Derryl Duval, MD [facility] 03/16/2024, 3:12 PM

## 2024-03-16 NOTE — Progress Notes (Signed)
 Patient refused Lovenox stating that he has been up and moving and does not feel like he needs it.MD made aware.

## 2024-03-16 NOTE — Consult Note (Signed)
 Cardiology Consultation   Patient ID: David Brooks MRN: [id number]; DOB: 07/23/1965  Admit date: 03/15/2024 Date of Consult: 03/16/2024  PCP:  David Brooks   [facility] Providers Cardiologist:  None  [facility] Cardiology David Brooks    Patient Profile: David Brooks is a 58 y.o. male with a hx of HTN, OSA (noncompliant with CPAP), DM2, GERD, and reportedly who is being seen 03/16/2024 for the evaluation of new acute CHF at the request of Dr. Mcarthur.  History of Present Illness: David Brooks has never seen a heart care or any other cardiologist per Care Everywhere review.  Presented to [facility] ED 11/30 for SOB, productive cough.  Admitted due to concern for new congestive HF. proBNP 412, TN 29 >28, procalcitonin WNL, K 4.1 > 3,  Cr WNL, CBC WNL, negative viral resp panel CXR suggestive of pneumonia versus edema or inhalation injury such as vaping.  Also trace pleural effusion. EKG: NSR, HR 79, nonspecific T wave changes with no previous comparison. ECHO 03/15/2024 showed EF 55 to 60%, mild LVH, G1DD, moderately dilated LA, no significant valvular abnormalities, negative bubble study Treated with IV lasix 40 mg x3. Currently Lasix 40 mg BID.   On interview, patient reports SOB with longer distance since last Monday and nonproductive cough X 2 days.  Associated with palpitations like heart is racing when he continued to push himself.  Currently, notes significant improvement in SOB, cough, and palpitations.  He has been able to walk around the room without any concerns.  He is currently wearing [place] O2 but does not wear oxygen at home. Denies any chest pain, dizziness, orthopnea, LE edema, syncope.  Patient follows with [facility] medical cardiology team due to coronary artery calcification.  He was scheduled for stress test but has had to reschedule but plans to follow-up and complete.  Reports overall compliance with medications except last week was out of medications and did not  realize losartan was switched to losartan HCTZ combo.  Notes that he was recently started on Farxiga by cardiology.  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   Past Medical History:  Diagnosis Date   Diabetes mellitus without complication (HCC)    Hypertension    Rotator cuff (capsule) sprain     History reviewed. No pertinent surgical history.   Home Medications:  Prior to Admission medications   Medication Sig Start Date End Date Taking? Authorizing Provider  amLODipine-olmesartan (AZOR) 10-40 MG tablet Take 1 tablet by mouth daily. 03/13/24  Yes [provider]  aspirin EC 81 MG tablet Take 81 mg by mouth daily.   Yes [provider]  ATROVENT HFA 17 MCG/ACT inhaler Inhale 1 puff into the lungs every 6 (six) hours as needed for wheezing. 03/14/24  Yes [provider]  DULoxetine (CYMBALTA) 60 MG capsule Take 60 mg by mouth daily. 12/15/17  Yes [provider]  FARXIGA 10 MG TABS tablet Take 10 mg by mouth daily. 01/09/24  Yes [provider]  losartan-hydrochlorothiazide (HYZAAR) 100-25 MG tablet Take 1 tablet by mouth daily. 08/15/23  Yes [provider]  oxyCODONE -acetaminophen  (PERCOCET) 10-325 MG tablet Take 1 tablet by mouth every 4 (four) hours as needed for pain. 03/14/24  Yes [provider]  rosuvastatin (CRESTOR) 10 MG tablet Take 10 mg by mouth daily. 02/26/24  Yes [provider]  tadalafil  (CIALIS ) 20 MG tablet Take 20 mg  by mouth daily as needed for erectile dysfunction. 04/29/22  Yes [provider]  testosterone  cypionate (DEPOTESTOSTERONE CYPIONATE) 200 MG/ML injection Inject 200 mg into the muscle every 14 (fourteen) days. 05/02/22  Yes [provider]    Scheduled Meds:  amLODipine   10 mg Oral Daily   aspirin  EC  81 mg Oral Daily   dapagliflozin  propanediol  10 mg Oral Daily   DULoxetine   60 mg  Oral Daily   enoxaparin  (LOVENOX ) injection  40 mg Subcutaneous Q24H   furosemide   40 mg Intravenous BID   insulin  aspart  0-15 Units Subcutaneous TID AC & HS   losartan   100 mg Oral Daily   potassium chloride   40 mEq Oral BID   umeclidinium bromide   1 puff Inhalation Daily   Continuous Infusions:  PRN Meds: acetaminophen , oxyCODONE -acetaminophen   Allergies:    Allergies  Allergen Reactions   Penicillins Anaphylaxis   Shrimp [Shellfish Allergy] Anaphylaxis   Tramadol  Rash    Possible combination with Zantac    Social History:   Social History   Socioeconomic History   Marital status: Single    Spouse name: Not on file   Number of children: Not on file   Years of education: Not on file   Highest education level: Not on file  Occupational History   Not on file  Tobacco Use   Smoking status: Never   Smokeless tobacco: Never  Substance and Sexual Activity   Alcohol use: No   Drug use: No   Sexual activity: Not on file  Other Topics Concern   Not on file  Social History Narrative   Not on file    Family History:    Family History  Problem Relation Age of Onset   Hypertension Mother    Hyperlipidemia Mother    Cancer Father        throat     ROS:  Please see the history of present illness.  All other ROS reviewed and negative.     Physical Exam/Data: Vitals:   03/15/24 2326 03/16/24 0505 03/16/24 0733 03/16/24 0753  BP: 139/72 (!) 148/81    Pulse: 90 83    Resp: 18 18    Temp: 98.4 F (36.9 C) 98.2 F (36.8 C)    TempSrc: Oral Oral    SpO2: 95% 96% 99%   Weight:    133.5 kg  Height:        Intake/Output Summary (Last 24 hours) at 03/16/2024 1048 Last data filed at 03/16/2024 0610 Gross per 24 hour  Intake 960 ml  Output 2800 ml  Net -1840 ml      03/16/2024    7:53 AM 03/15/2024    1:26 AM 05/05/2023    7:25 AM  Last 3 Weights  Weight (lbs) 294 lb 5 oz 308 lb 297 lb  Weight (kg) 133.5 kg 139.708 kg 134.718 kg     Body mass index is  41.05 kg/m.  General:  Well nourished, well developed, in no acute distress  HEENT: normal Neck: no JVD Vascular: No carotid bruits; Distal pulses 2+ bilaterally Cardiac:  normal S1, S2; RRR; no murmur  Lungs:  clear to auscultation bilaterally, no wheezing, rhonchi or rales  Abd: soft, nontender, no hepatomegaly  Ext: no edema Musculoskeletal:  No deformities, BUE and BLE strength normal and equal Skin: warm and dry  Neuro:  CNs 2-12 intact, no focal abnormalities noted Psych:  Normal affect   EKG:  The EKG was personally reviewed and demonstrates:  NSR, HR 79, nonspecific T wave changes with no previous comparison.  Telemetry:  Telemetry was personally reviewed and demonstrates:  NSR, 70-80's  Relevant CV Studies: ECHO IMPRESSIONS 03/15/2024  1. Left ventricular ejection fraction, by estimation, is 55 to 60%. The  left ventricle has normal function. The left ventricle has no regional  wall motion abnormalities. There is mild concentric left ventricular  hypertrophy. Left ventricular diastolic  parameters are consistent with Grade I diastolic dysfunction (impaired  relaxation).   2. Right ventricular systolic function is normal. The right ventricular  size is normal. There is normal pulmonary artery systolic pressure. The  estimated right ventricular systolic pressure is 32.2 mmHg.   3. Left atrial size was moderately dilated.   4. The mitral valve is normal in structure. No evidence of mitral valve  regurgitation. No evidence of mitral stenosis.   5. The aortic valve is tricuspid. Aortic valve regurgitation is mild. No  aortic stenosis is present.   6. Agitated saline contrast bubble study was negative, with no evidence  of any interatrial shunt.   Laboratory Data: High Sensitivity Troponin:  No results for input(s): TROPONINIHS in the last 720 hours.   Chemistry Recent Labs  Lab 03/15/24 0233 03/15/24 0553 03/15/24 0807 03/16/24 0458  NA 134*  --   --  137  K 3.5   --  3.2* 3.0*  CL 97*  --   --  99  CO2 25  --   --  27  GLUCOSE 271*  --   --  144*  BUN 14  --   --  14  CREATININE 1.19 1.29*  --  1.22  CALCIUM 8.4*  --   --  8.8*  GFRNONAA >60 >60  --  >60  ANIONGAP 12  --   --  12    Recent Labs  Lab 03/15/24 0233  PROT 6.7  ALBUMIN 3.7  AST 32  ALT 16  ALKPHOS 46  BILITOT 0.4   Lipids No results for input(s): CHOL, TRIG, HDL, LABVLDL, LDLCALC, CHOLHDL in the last 168 hours.  Hematology Recent Labs  Lab 03/15/24 0233 03/15/24 0553  WBC 9.3 10.2  RBC 5.07 5.01  HGB 14.7 14.6  HCT 43.0 43.1  MCV 84.8 86.0  MCH 29.0 29.1  MCHC 34.2 33.9  RDW 13.1 13.2  PLT 223 220   Thyroid No results for input(s): TSH, FREET4 in the last 168 hours.  BNP Recent Labs  Lab 03/15/24 0233  PROBNP 412.0*    DDimer No results for input(s): DDIMER in the last 168 hours.  Radiology/Studies:   DG Chest 2 View Result Date: 03/15/2024 EXAM: 2 VIEW(S) XRAY OF THE CHEST 03/15/2024 01:45:10 AM COMPARISON: None available. CLINICAL HISTORY: eval for cough FINDINGS: LUNGS AND PLEURA: There are moderately dense perihilar alveolar opacities extending outward with peripheral sparing. There are trace pleural effusions but no large collection. No pneumothorax. There is a moderately elevated right hemidiaphragm consistent with paresis or eventration. HEART AND MEDIASTINUM: The heart is at the upper limit of normal in size. Central vessels are not well seen due to superimposed airspace disease. The mediastinum is normally outlined. BONES AND SOFT TISSUES: Thoracic cage intact with mild thoracic dextroscoliosis. IMPRESSION: 1. Moderately dense perihilar opacities with peripheral sparing, consistent with airspace disease such as due to pneumonia, edema, or inhalational injury such as due to vaping . 2. Trace pleural effusions. 3. Moderately elevated right hemidiaphragm, consistent with paresis or eventration. Electronically signed by: Francis Quam MD  03/15/2024  01:55 AM EST RP Workstation: [id number]     Assessment and Plan:  NEW Acute HFpEF ECHO 03/15/2024 showed EF 55 to 60%, mild LVH, G1DD, moderately dilated LA, no significant valvular abnormalities, negative bubble study Presented with SOB with longer distance. Denies edema, orthopnea, or PND. Currently, symptoms have resolved and patient feel like he is back at baseline.  CXR suggestive of trace pleural effusion. Can consider repeat CXR with IV diuresis or chest CT for reassessment prior to discharge. Will defer to primary tam.  Net I/O: -2440, Wt 308 > 294,  Cr 1.19 > 1.29 > 1.22. Continue with strict I/O, daily standing weights if possible, and monitoring renal function.   Treated with IV lasix 40 mg x3. Currently Lasix 40 mg BID. Appears Euvolemic on exam. Would Discontinue IV lasix. Would recommend resuming home losartan-hydrochlorothiazide combo 100-25 mg with lasix PRN. Can reassess with [facility]. Encourage to follow up with cardiology for stress test  Continue on Farxiga 10 mg Encouraged low sodium diet, fluid restriction <2L, and daily weights.  Educated to contact our office for weight gain of 2 lbs overnight or 5 lbs in one week. ED precautions discussed.    Acute respiratory failure with hypoxia  Currently remains on [place]. Not on O2 at home  Recommend hall ambulation with oxygen monitoring.   HTN BP initially elevated 160s to 170s/80s to 90s Most recent BP 148/81 Continue amlodipine 10 mg daily Suspect home med HCTZ discontinued while on IV Lasix.  Discontinue IV lasix and resume home losartan hydrochlorothiazide as above.   DM2 A1c 9.3 Managed by primary team   Risk Assessment/Risk Scores:       [place]  Heart Association (NYHA) Functional Class NYHA Class II       For questions or updates, please contact [facility] Please consult [email or web address] for contact info under      Signed, Lorette CINDERELLA Kapur, PA-C  03/16/2024 10:48 AM

## 2024-03-16 NOTE — Inpatient Diabetes Management (Addendum)
 Inpatient Diabetes Program Recommendations  AACE/[name]: New Consensus Statement on Inpatient Glycemic Control  Target Ranges:  Prepandial:   less than 140 mg/dL      Peak postprandial:   less than 180 mg/dL (1-2 hours)      Critically ill patients:  140 - 180 mg/dL    Latest Reference Range & Units 03/15/24 07:23 03/15/24 11:41 03/15/24 17:00 03/15/24 20:19 03/16/24 07:28  Glucose-Capillary 70 - 99 mg/dL 801 (H) 775 (H) 876 (H) 227 (H) 142 (H)    Latest Reference Range & Units 03/15/24 05:53  Hemoglobin A1C 4.8 - 5.6 % 9.3 (H)   Review of Glycemic Control  Diabetes history: DM2 Outpatient Diabetes medications: None; has Glyburide 5 mg BID, Metformin XR 1000 mg BID, Actos 45 mg daily, and Ozempic 0.25 mg Qweek listed but not taking any DM meds Current orders for Inpatient glycemic control: Farxiga 10 mg daily, Novolog 0-15 units AC&HS  Inpatient Diabetes Program Recommendations:    HbgA1C: A1C 9.3% on 03/15/24 indicating an average glucose of 220 mg/dl over the past 2-3 months. Patient will need Rx for glucose monitoring kit (#[id number]) and DM medication at discharge.  NOTE: Patient admitted with acute diastolic heart failure, acute respiratory failure, and hypertension. Patient has hx of DM and per H&P, is not taking any DM medications outpatient and patient reported last A1C was 8.1%.  Patient has Medicaid insurance; will need Rx for DM medication at discharge.  Addendum 03/16/24@11 :40-Spoke with patient over the phone regarding DM control. Patient states that his prior PCP retired and he started seeing a new PCP. Patient states he was on DM medications with prior PCP and he thinks that when he started seeing a new PCP, his DM medications were not continued.  Patient has not taken any DM meds in 2-3 months. Patient states that his last A1C was 8.1% (he was taking oral DM medications at that time). Patient states he knows he was taking Farxiga but not sure about the names of any other oral DM  medications he had been on recently. Patient has been checking glucose from time to time and has noticed that it has been in the 200's recently. Patient states that over the holidays, he does tend to eat more and his glucose runs a little higher anyway.  Discussed current A1C of 9.3% on 03/15/24 indicating an average glucose of 220 mg/dl.  Discussed glucose and A1C goals; stressed importance of DM control to decrease risk of complications from uncontrolled DM.  Discussed that he is currently ordered Farxiga and Novolog correction and glucose trends are better today compared to yesterday. Patient states that he will take DM medications as prescribed at discharge. Asked that he check glucose and take glucose log or glucometer to follow up appointments with new PCP. Patient reports that he already has an appointment schedule with PCP for follow up. Patient requested Rx for new glucometer and testing supplies. Discussed CGM and how CGMs work. Patient states he would rather just use glucometer for glucose monitoring at this time. Patient appreciative of call and verbalized understanding of information discussed.   Thanks, Earnie Gainer, RN, MSN, CDCES Diabetes Coordinator Inpatient Diabetes Program [phone] (Team Pager from 8am to 5pm)

## 2024-03-17 ENCOUNTER — Other Ambulatory Visit (HOSPITAL_COMMUNITY): Payer: Self-pay

## 2024-03-17 ENCOUNTER — Inpatient Hospital Stay (HOSPITAL_COMMUNITY)

## 2024-03-17 ENCOUNTER — Telehealth (HOSPITAL_COMMUNITY): Payer: Self-pay | Admitting: Pharmacy Technician

## 2024-03-17 DIAGNOSIS — I5031 Acute diastolic (congestive) heart failure: Secondary | ICD-10-CM | POA: Diagnosis not present

## 2024-03-17 DIAGNOSIS — E876 Hypokalemia: Secondary | ICD-10-CM

## 2024-03-17 DIAGNOSIS — I10 Essential (primary) hypertension: Secondary | ICD-10-CM | POA: Diagnosis not present

## 2024-03-17 DIAGNOSIS — R0602 Shortness of breath: Secondary | ICD-10-CM | POA: Diagnosis not present

## 2024-03-17 LAB — BASIC METABOLIC PANEL WITH GFR
Anion gap: 12 (ref 5–15)
BUN: 17 mg/dL (ref 6–20)
CO2: 26 mmol/L (ref 22–32)
Calcium: 8.8 mg/dL — ABNORMAL LOW (ref 8.9–10.3)
Chloride: 97 mmol/L — ABNORMAL LOW (ref 98–111)
Creatinine, Ser: 1.25 mg/dL — ABNORMAL HIGH (ref 0.61–1.24)
GFR, Estimated: >60 mL/min (ref >60)
Glucose, Bld: 141 mg/dL — ABNORMAL HIGH (ref 70–99)
Potassium: 3.3 mmol/L — ABNORMAL LOW (ref 3.5–5.1)
Sodium: 135 mmol/L (ref 135–145)

## 2024-03-17 LAB — GLUCOSE, CAPILLARY
Glucose-Capillary: 144 mg/dL — ABNORMAL HIGH (ref 70–99)
Glucose-Capillary: 204 mg/dL — ABNORMAL HIGH (ref 70–99)
Glucose-Capillary: 125 mg/dL — ABNORMAL HIGH (ref 70–99)
Glucose-Capillary: 209 mg/dL — ABNORMAL HIGH (ref 70–99)

## 2024-03-17 LAB — MAGNESIUM: Magnesium: 1.9 mg/dL (ref 1.7–2.4)

## 2024-03-17 MED ORDER — POTASSIUM CHLORIDE CRYS ER 20 MEQ PO TBCR: 40.0000 meq | EXTENDED_RELEASE_TABLET | Freq: Once | ORAL | Status: AC

## 2024-03-17 NOTE — Telephone Encounter (Signed)
 Patient Product/process Development Scientist completed.    The patient is insured through [facility].     Ran test claim for Farxiga 10 mg and the current 30 day co-pay is $4.00.  Ran test claim for Incruse Ellipta and the current 30 day co-pay is $4.00.  This test claim was processed through [facility]- copay amounts may vary at other pharmacies due to pharmacy/plan contracts, or as the patient moves through the different stages of their insurance plan.     Reyes Sharps, CPHT Pharmacy Technician Patient Advocate Specialist Lead [facility] Patient Advocate Team Direct Number: [phone]  Fax: [phone]

## 2024-03-17 NOTE — Progress Notes (Signed)
 PROGRESS NOTE    David Brooks  FMW:[id number] DOB: 1966-02-10 DOA: 03/15/2024 PCP: Pcp, No   Brief Narrative:   58 year old male with history of hypertension, OSA noncompliant with CPAP, obesity who presents to the ER with complaints of dyspnea on exertion, coughing.  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  Patient denies sick contacts.  Patient has seen cardiologist for management of hypertension.  Patient is also diagnosed with diabetes with last hemoglobin A1c reportedly 8.1 but not on any medications.  In the ER, he was satting 87% on room air, blood pressure in 150s to 160s systolic.  Chest x-ray revealed moderately dense perihilar opacities with peripheral sparing, consistent airspace disease such as pneumonia, edema or inhalational injury.  Patient denies smoking or vaping.  Moderately elevated right hemidiaphragm, consistent with varices or eventration.  Patient also reports of increasing body weight recently. Admitted due to concern for congestive heart failure.  proBNP 412, troponin T 28 and flat, procalcitonin less than 0.10.  COVID-19, RSV, influenza A and B-.  Assessment & Plan:   Principal Problem:   Acute diastolic heart failure (HCC) Active Problems:   DM (diabetes mellitus) (HCC)   Essential hypertension, benign   Obstructive sleep apnea   Shortness of breath   Pulmonary edema   Acute diastolic heart failure/volume overload -Significantly improving with IV diuresis. - Will continue with Lasix 40 mg IV twice daily with close monitoring of electrolytes and kidney function, intake and output, daily weights -Body weight decreased from 139 kg to 131 kg since admission -Follow-up chest x-ray 12/2 shows improvement in pulmonary edema.  Has elevated right hemidiaphragm. -Replace potassium as needed - Echocardiogram shows LVEF 55 to 60%, grade 1 diastolic  dysfunction. -Patient cardiology input.  Continue Farxiga, losartan - Mild troponin elevation likely from CHF. - Procalcitonin less than 0.10, unlikely bacterial infection.  Viral respiratory panel is negative.   Acute respiratory failure with hypoxia - Secondary to pulmonary edema, still requiring some supplemental oxygen.  Will wean down as tolerated.  diabetes type 2  Hemoglobin A1c is 9.3.  Patient used to be on oral hypoglycemics but has not been taking it for a while. Will need to start oral hypoglycemics on discharge  Hypertension -continue norvasc 10, losartan 100. Resume hydrochlorothiazide 25 daily on dc   Chronic shoulder pain - continue cymbalta -prn percocet   . Disposition: home    Advance Care Planning:   Code Status: Full Code    Consults: cardiology consult    Family Communication: none present    Severity of Illness: The appropriate patient status for this patient is INPATIENT. Inpatient status is judged to be reasonable and necessary in order to provide the required intensity of service to ensure the patient's safety. The patient's presenting symptoms, physical exam findings, and initial radiographic and laboratory data in the context of their chronic comorbidities is felt to place them at high risk for further clinical deterioration. Furthermore, it is not anticipated that the patient will be medically stable for discharge from the hospital within 2 midnights of admission.    * I certify that at the point of admission it is my clinical judgment that the patient will require inpatient hospital care spanning beyond 2 midnights from the point of admission due to high intensity of service, high risk for further deterioration and high frequency of surveillance required.*    Subjective:  Patient  seen and examined at the bedside.  Initially plan for possible discharge to home today however patient had to put back on some supplemental oxygen at rest and was  persistently tachycardic in 130s with slight ambulation.  Will plan for discharge tomorrow  Objective: Vitals:   03/16/24 1329 03/16/24 2009 03/17/24 0420 03/17/24 0741  BP: (!) 142/68 (!) 148/89 (!) 152/84   Pulse: 91 (!) 103 88   Resp: 19 18 20    Temp: 98.3 F (36.8 C) 99.4 F (37.4 C) 98.7 F (37.1 C)   TempSrc: Oral Oral Oral   SpO2: 92% 96% 96% 97%  Weight:   131.1 kg   Height:        Intake/Output Summary (Last 24 hours) at 03/17/2024 1313 Last data filed at 03/17/2024 1047 Gross per 24 hour  Intake 1200 ml  Output 2500 ml  Net -1300 ml   Filed Weights   03/15/24 1147 03/16/24 0753 03/17/24 0420  Weight: 135.8 kg 133.5 kg 131.1 kg    Examination:  General: Alert, oriented not in any acute distress HEENT: Moist oral mucosa Chest: Mild crackles bilaterally CVs: S1, S2, no murmur, regular rhythm Abdomen: Soft, obese Extremities: Mild edema peripherally.  Data Reviewed: I have personally reviewed following labs and imaging studies  CBC: Recent Labs  Lab 03/15/24 0233 03/15/24 0553  WBC 9.3 10.2  NEUTROABS 7.2  --   HGB 14.7 14.6  HCT 43.0 43.1  MCV 84.8 86.0  PLT 223 220   Basic Metabolic Panel: Recent Labs  Lab 03/15/24 0233 03/15/24 0553 03/15/24 0807 03/16/24 0458 03/17/24 0502  NA 134*  --   --  137 135  K 3.5  --  3.2* 3.0* 3.3*  CL 97*  --   --  99 97*  CO2 25  --   --  27 26  GLUCOSE 271*  --   --  144* 141*  BUN 14  --   --  14 17  CREATININE 1.19 1.29*  --  1.22 1.25*  CALCIUM 8.4*  --   --  8.8* 8.8*  MG  --   --   --   --  1.9   GFR: Estimated Creatinine Clearance: 90 mL/min (A) (by C-G formula based on SCr of 1.25 mg/dL (H)). Liver Function Tests: Recent Labs  Lab 03/15/24 0233  AST 32  ALT 16  ALKPHOS 46  BILITOT 0.4  PROT 6.7  ALBUMIN 3.7   No results for input(s): LIPASE, AMYLASE in the last 168 hours. No results for input(s): AMMONIA in the last 168 hours. Coagulation Profile: No results for input(s): INR,  PROTIME in the last 168 hours. Cardiac Enzymes: No results for input(s): CKTOTAL, CKMB, CKMBINDEX, TROPONINI in the last 168 hours. BNP (last 3 results) Recent Labs    03/15/24 0233  PROBNP 412.0*   HbA1C: Recent Labs    03/15/24 0553  HGBA1C 9.3*   CBG: Recent Labs  Lab 03/16/24 1057 03/16/24 1643 03/16/24 2058 03/17/24 0743 03/17/24 1117  GLUCAP 187* 122* 187* 144* 204*   Lipid Profile: No results for input(s): CHOL, HDL, LDLCALC, TRIG, CHOLHDL, LDLDIRECT in the last 72 hours. Thyroid Function Tests: No results for input(s): TSH, T4TOTAL, FREET4, T3FREE, THYROIDAB in the last 72 hours. Anemia Panel: No results for input(s): VITAMINB12, FOLATE, FERRITIN, TIBC, IRON, RETICCTPCT in the last 72 hours. Sepsis Labs: Recent Labs  Lab 03/15/24 0553  PROCALCITON <0.10    Recent Results (from the past 240 hours)  Respiratory (~20 pathogens) panel  by PCR     Status: None   Collection Time: 03/15/24  4:20 AM   Specimen: Nasopharyngeal Swab; Respiratory  Result Value Ref Range Status   Adenovirus NOT DETECTED NOT DETECTED Final   Coronavirus 229E NOT DETECTED NOT DETECTED Final    Comment: (NOTE) The Coronavirus on the Respiratory Panel, DOES NOT test for the novel  Coronavirus (2019 nCoV)    Coronavirus HKU1 NOT DETECTED NOT DETECTED Final   Coronavirus NL63 NOT DETECTED NOT DETECTED Final   Coronavirus OC43 NOT DETECTED NOT DETECTED Final   Metapneumovirus NOT DETECTED NOT DETECTED Final   Rhinovirus / Enterovirus NOT DETECTED NOT DETECTED Final   Influenza A NOT DETECTED NOT DETECTED Final   Influenza B NOT DETECTED NOT DETECTED Final   Parainfluenza Virus 1 NOT DETECTED NOT DETECTED Final   Parainfluenza Virus 2 NOT DETECTED NOT DETECTED Final   Parainfluenza Virus 3 NOT DETECTED NOT DETECTED Final   Parainfluenza Virus 4 NOT DETECTED NOT DETECTED Final   Respiratory Syncytial Virus NOT DETECTED NOT DETECTED Final    Bordetella pertussis NOT DETECTED NOT DETECTED Final   Bordetella Parapertussis NOT DETECTED NOT DETECTED Final   Chlamydophila pneumoniae NOT DETECTED NOT DETECTED Final   Mycoplasma pneumoniae NOT DETECTED NOT DETECTED Final    Comment: Performed at [facility], [address] [place], [place] [address]         Radiology Studies: ECHOCARDIOGRAM COMPLETE BUBBLE STUDY Result Date: 03/15/2024    ECHOCARDIOGRAM REPORT   Patient Name:   David Brooks Date of Exam: 03/15/2024 Medical Rec #:  [id number]       Height:       71.0 in Accession #:    [id number]      Weight:       308.0 lb Date of Birth:  1965/09/22       BSA:          2.533 m Patient Age:    58 years        BP:           168/96 mmHg Patient Gender: M               HR:           73 bpm. Exam Location:  [facility] Procedure: 2D Echo, Cardiac Doppler, Color Doppler and Saline Contrast Bubble            Study (Both Spectral and Color Flow Doppler were utilized during            procedure). Indications:    Acute Diastolic Heart Failure (HCC)  History:        Patient has no prior history of Echocardiogram examinations.                 Signs/Symptoms:Shortness of Breath, Dyspnea and Edema; Risk                 Factors:Hypertension, Sleep Apnea and Diabetes.  Sonographer:    Koleen Popper RDCS Referring Phys: [id number] BRENDA MORRISON IMPRESSIONS  1. Left ventricular ejection fraction, by estimation, is 55 to 60%. The left ventricle has normal function. The left ventricle has no regional wall motion abnormalities. There is mild concentric left ventricular hypertrophy. Left ventricular diastolic parameters are consistent with Grade I diastolic dysfunction (impaired relaxation).  2. Right ventricular systolic function is normal. The right ventricular size is normal. There is normal pulmonary artery systolic pressure. The estimated right ventricular systolic pressure is 32.2 mmHg.  3. Left atrial size was moderately dilated.  4. The mitral valve  is normal in structure. No evidence of mitral valve regurgitation. No evidence of mitral stenosis.  5. The aortic valve is tricuspid. Aortic valve regurgitation is mild. No aortic stenosis is present.  6. Agitated saline contrast bubble study was negative, with no evidence of any interatrial shunt. FINDINGS  Left Ventricle: Left ventricular ejection fraction, by estimation, is 55 to 60%. The left ventricle has normal function. The left ventricle has no regional wall motion abnormalities. The left ventricular internal cavity size was normal in size. There is  mild concentric left ventricular hypertrophy. Left ventricular diastolic parameters are consistent with Grade I diastolic dysfunction (impaired relaxation). Right Ventricle: The right ventricular size is normal. No increase in right ventricular wall thickness. Right ventricular systolic function is normal. There is normal pulmonary artery systolic pressure. The tricuspid regurgitant velocity is 2.70 m/s, and  with an assumed right atrial pressure of 3 mmHg, the estimated right ventricular systolic pressure is 32.2 mmHg. Left Atrium: Left atrial size was moderately dilated. Right Atrium: Right atrial size was normal in size. Pericardium: There is no evidence of pericardial effusion. Mitral Valve: The mitral valve is normal in structure. There is mild thickening of the anterior mitral valve leaflet(s). There is mild calcification of the anterior mitral valve leaflet(s). No evidence of mitral valve regurgitation. No evidence of mitral  valve stenosis. Tricuspid Valve: The tricuspid valve is normal in structure. Tricuspid valve regurgitation is trivial. Aortic Valve: The aortic valve is tricuspid. Aortic valve regurgitation is mild. Aortic regurgitation PHT measures 510 msec. No aortic stenosis is present. Pulmonic Valve: The pulmonic valve was grossly normal. Pulmonic valve regurgitation is not visualized. No evidence of pulmonic stenosis. Aorta: The aortic root is  normal in size and structure. IAS/Shunts: The interatrial septum was not well visualized. Agitated saline contrast was given intravenously to evaluate for intracardiac shunting. Agitated saline contrast bubble study was negative, with no evidence of any interatrial shunt.  LEFT VENTRICLE PLAX 2D LVIDd:         4.60 cm   Diastology LVIDs:         3.50 cm   LV e' medial:    4.68 cm/s LV PW:         1.50 cm   LV E/e' medial:  14.4 LV IVS:        1.40 cm   LV e' lateral:   6.31 cm/s LVOT diam:     2.10 cm   LV E/e' lateral: 10.7 LV SV:         63 LV SV Index:   25 LVOT Area:     3.46 cm  IVC IVC diam: 2.30 cm LEFT ATRIUM             Index LA diam:        5.10 cm 2.01 cm/m LA Vol (A2C):   43.2 ml 17.05 ml/m LA Vol (A4C):   71.0 ml 28.03 ml/m LA Biplane Vol: 56.5 ml 22.30 ml/m  AORTIC VALVE LVOT Vmax:   102.00 cm/s LVOT Vmean:  66.400 cm/s LVOT VTI:    0.183 m AI PHT:      510 msec  AORTA Ao Root diam: 3.30 cm MITRAL VALVE               TRICUSPID VALVE MV Area (PHT): 3.60 cm    TR Peak grad:   29.2 mmHg MV Decel Time: 211 msec    TR Vmax:  270.00 cm/s MV E velocity: 67.30 cm/s MV A velocity: 76.60 cm/s  SHUNTS MV E/A ratio:  0.88        Systemic VTI:  0.18 m                            Systemic Diam: 2.10 cm Mihai Croitoru MD Electronically signed by Jerel Balding MD Signature Date/Time: 03/15/2024/4:10:37 PM    Final         Scheduled Meds:  amLODipine  10 mg Oral Daily   aspirin EC  81 mg Oral Daily   dapagliflozin propanediol  10 mg Oral Daily   DULoxetine  60 mg Oral Daily   enoxaparin (LOVENOX) injection  40 mg Subcutaneous Q24H   insulin aspart  0-15 Units Subcutaneous TID AC & HS   losartan  100 mg Oral Daily   umeclidinium bromide  1 puff Inhalation Daily   Continuous Infusions:        Derryl Duval, MD [facility] 03/17/2024, 1:13 PM

## 2024-03-17 NOTE — Progress Notes (Signed)
 Rounding Note   Patient Name: David Brooks Date of Encounter: 03/17/2024  [facility] Cardiologist: None [facility] Dr. [name]   Subjective Denies any SOB, LE edema, chest pain. He off supplemental oxygen. Patient states he feel significantly better and back at baseline.   Scheduled Meds:  amLODipine   10 mg Oral Daily   aspirin  EC  81 mg Oral Daily   dapagliflozin  propanediol  10 mg Oral Daily   DULoxetine   60 mg Oral Daily   enoxaparin  (LOVENOX ) injection  40 mg Subcutaneous Q24H   furosemide   40 mg Intravenous BID   insulin  aspart  0-15 Units Subcutaneous TID AC & HS   losartan   100 mg Oral Daily   potassium chloride   40 mEq Oral Once   umeclidinium bromide   1 puff Inhalation Daily   Continuous Infusions:  PRN Meds: acetaminophen , oxyCODONE -acetaminophen    Vital Signs  Vitals:   03/16/24 1329 03/16/24 2009 03/17/24 0420 03/17/24 0741  BP: (!) 142/68 (!) 148/89 (!) 152/84   Pulse: 91 (!) 103 88   Resp: 19 18 20    Temp: 98.3 F (36.8 C) 99.4 F (37.4 C) 98.7 F (37.1 C)   TempSrc: Oral Oral Oral   SpO2: 92% 96% 96% 97%  Weight:   131.1 kg   Height:        Intake/Output Summary (Last 24 hours) at 03/17/2024 0947 Last data filed at 03/17/2024 0421 Gross per 24 hour  Intake 720 ml  Output 2700 ml  Net -1980 ml      03/17/2024    4:20 AM 03/16/2024    7:53 AM 03/15/2024   11:47 AM  Last 3 Weights  Weight (lbs) 289 lb 0.4 oz 294 lb 5 oz 299 lb 6.2 oz  Weight (kg) 131.1 kg 133.5 kg 135.8 kg      Telemetry NSR, HR 80-100 - Personally Reviewed   Physical Exam GEN: No acute distress.   Neck: No JVD Cardiac: RRR, no murmurs, rubs, or gallops.  Respiratory: Clear to auscultation bilaterally. GI: Soft, nontender, non-distended  MS: No edema; No deformity. Neuro:  Nonfocal  Psych: Normal affect   Labs High Sensitivity Troponin:  No results for input(s): TROPONINIHS in the last 720 hours.   Chemistry Recent Labs  Lab  03/15/24 0233 03/15/24 0553 03/15/24 0807 03/16/24 0458 03/17/24 0502  NA 134*  --   --  137 135  K 3.5  --  3.2* 3.0* 3.3*  CL 97*  --   --  99 97*  CO2 25  --   --  27 26  GLUCOSE 271*  --   --  144* 141*  BUN 14  --   --  14 17  CREATININE 1.19 1.29*  --  1.22 1.25*  CALCIUM 8.4*  --   --  8.8* 8.8*  PROT 6.7  --   --   --   --   ALBUMIN 3.7  --   --   --   --   AST 32  --   --   --   --   ALT 16  --   --   --   --   ALKPHOS 46  --   --   --   --   BILITOT 0.4  --   --   --   --   GFRNONAA >60 >60  --  >60 >60  ANIONGAP 12  --   --  12 12    Lipids No  results for input(s): CHOL, TRIG, HDL, LABVLDL, LDLCALC, CHOLHDL in the last 168 hours.  Hematology Recent Labs  Lab 03/15/24 0233 03/15/24 0553  WBC 9.3 10.2  RBC 5.07 5.01  HGB 14.7 14.6  HCT 43.0 43.1  MCV 84.8 86.0  MCH 29.0 29.1  MCHC 34.2 33.9  RDW 13.1 13.2  PLT 223 220   Thyroid No results for input(s): TSH, FREET4 in the last 168 hours.  BNP Recent Labs  Lab 03/15/24 0233  PROBNP 412.0*    DDimer No results for input(s): DDIMER in the last 168 hours.   Cardiac Studies ECHO IMPRESSIONS 03/15/2024  1. Left ventricular ejection fraction, by estimation, is 55 to 60%. The left ventricle has normal function. The left ventricle has no regional wall motion abnormalities. There is mild concentric left ventricular  hypertrophy. Left ventricular diastolic  parameters are consistent with Grade I diastolic dysfunction (impaired  relaxation).   2. Right ventricular systolic function is normal. The right ventricular  size is normal. There is normal pulmonary artery systolic pressure. The  estimated right ventricular systolic pressure is 32.2 mmHg.   3. Left atrial size was moderately dilated.   4. The mitral valve is normal in structure. No evidence of mitral valve regurgitation. No evidence of mitral stenosis.   5. The aortic valve is tricuspid. Aortic valve regurgitation is mild. No aortic stenosis  is present.   6. Agitated saline contrast bubble study was negative, with no evidence of any interatrial shunt.   Patient Profile   58 y.o. male  with hx of HTN, OSA (noncompliant with CPAP), DM2, GERD, and reportedly who is being seen 03/16/2024 for the evaluation of new acute CHF at the request of Dr. Mcarthur.   Assessment & Plan  NEW Acute HFpEF He follows at [facility] with history of coronary artery calcification, but no prior documented obstructive CAD.  Most recently started on Farxiga and also switched from Cozaar to Hyzaar. He was out of Hyzaar for about a week recently as well.  Presented with SOB with longer distance and increasing weight. Denies edema, orthopnea, or PND. Currently, symptoms have resolved and patient feel like he is back at baseline.  Pro BNP 412. Tn not consistent with ACS.  ECHO 03/15/2024 showed EF 55 to 60%, mild LVH, G1DD, moderately dilated LA, no significant valvular abnormalities, negative bubble study CXR suggestive of trace pleural effusion. Repeat CXR pending for reassessment.  Net I/O: -4420, Wt 308 > 294 >289 lbs,  Cr 1.19 > 1.29 > 1.22> 1.25 Continue with strict I/O, daily standing weights if possible, and monitoring renal function.   Treated with IV lasix 40 mg x5. Currently IV Lasix 40 mg BID. Appears Euvolemic on exam. Discontinue IV lasix. Resume losartan-hydrochlorothiazide combo 100-25 mg with lasix PRN upon discharge.  Order Lasix 20 mg PRN  Order hydrochlorothiazide 25 mg to start tomorrow since dosed with IV lasix this am.  Continue on Farxiga 10 mg, losartan 100 mg daily  Encouraged low sodium diet, fluid restriction <2L, and daily weights.  Educated to contact our office for weight gain of 2 lbs overnight or 5 lbs in one week. ED precautions discussed.   Will need to follow up with primary cardiology at [facility] for stress testing, which was already previously arranged.    Hypokalemia  K 3 > 3.3 after multiple supplemental K.  Order Mg.  Continue with supplemental K as scheduled.    Acute respiratory failure with hypoxia  Patient is off supplemental oxygen.  Not on O2 at home  Recommend hall ambulation with oxygen and symptom monitoring.    HTN BP initially elevated 160s to 170s/80s to 90s Most recent BP 152/84 Continue amlodipine 10 mg daily, Losartan 100 mg. He has not anti-HTN meditations yet this am. Continue to monitor BP after meds. .  Discontinue IV lasix.  Order Lasix 20 mg PRN  Order hydrochlorothiazide 25 mg to start tomorrow since dosed with IV lasix this am.    DM2 A1c 9.3 Managed by primary team    For questions or updates, please contact [facility] Please consult [email or web address] for contact info under       Signed, Lorette CINDERELLA Kapur, PA-C  03/17/2024, 9:47 AM

## 2024-03-18 DIAGNOSIS — I5031 Acute diastolic (congestive) heart failure: Secondary | ICD-10-CM | POA: Diagnosis not present

## 2024-03-18 LAB — BASIC METABOLIC PANEL WITH GFR
Anion gap: 13 (ref 5–15)
BUN: 20 mg/dL (ref 6–20)
CO2: 27 mmol/L (ref 22–32)
Calcium: 9.2 mg/dL (ref 8.9–10.3)
Chloride: 96 mmol/L — ABNORMAL LOW (ref 98–111)
Creatinine, Ser: 1.39 mg/dL — ABNORMAL HIGH (ref 0.61–1.24)
GFR, Estimated: 59 mL/min — ABNORMAL LOW (ref >60)
Glucose, Bld: 134 mg/dL — ABNORMAL HIGH (ref 70–99)
Potassium: 3.5 mmol/L (ref 3.5–5.1)
Sodium: 136 mmol/L (ref 135–145)

## 2024-03-18 LAB — GLUCOSE, CAPILLARY
Glucose-Capillary: 130 mg/dL — ABNORMAL HIGH (ref 70–99)
Glucose-Capillary: 221 mg/dL — ABNORMAL HIGH (ref 70–99)

## 2024-03-18 LAB — MAGNESIUM: Magnesium: 2.2 mg/dL (ref 1.7–2.4)

## 2024-03-18 MED ORDER — BLOOD GLUCOSE MONITORING SUPPL DEVI: 1.0000 | 0 refills | Status: AC

## 2024-03-18 MED ORDER — GLYBURIDE MICRONIZED 1.5 MG PO TABS: 3.0000 mg | ORAL_TABLET | Freq: Two times a day (BID) | ORAL | 0 refills | Status: AC

## 2024-03-18 MED ORDER — AMLODIPINE BESYLATE 10 MG PO TABS: 10.0000 mg | ORAL_TABLET | Freq: Every day | ORAL | 0 refills | Status: AC

## 2024-03-18 MED ORDER — BLOOD GLUCOSE TEST VI STRP: 1.0000 | ORAL_STRIP | 0 refills | Status: AC

## 2024-03-18 MED ORDER — LANCETS MISC: 1.0000 | 0 refills | Status: AC

## 2024-03-18 MED ORDER — METFORMIN HCL ER 500 MG PO TB24: 500.0000 mg | ORAL_TABLET | Freq: Two times a day (BID) | ORAL | 0 refills | Status: AC

## 2024-03-18 MED ORDER — LOSARTAN POTASSIUM 100 MG PO TABS: 100.0000 mg | ORAL_TABLET | Freq: Every day | ORAL | 0 refills | Status: AC

## 2024-03-18 MED ORDER — HYDROCHLOROTHIAZIDE 25 MG PO TABS: 25.0000 mg | ORAL_TABLET | Freq: Every day | ORAL | Status: DC

## 2024-03-18 MED ORDER — LANCET DEVICE MISC: 1.0000 | 0 refills | Status: AC

## 2024-03-18 MED ORDER — FUROSEMIDE 20 MG PO TABS: 20.0000 mg | ORAL_TABLET | ORAL | Status: DC | PRN

## 2024-03-18 MED ORDER — HYDROCHLOROTHIAZIDE 25 MG PO TABS: 25.0000 mg | ORAL_TABLET | Freq: Every day | ORAL | 0 refills | Status: AC

## 2024-03-18 MED ORDER — FUROSEMIDE 20 MG PO TABS: 20.0000 mg | ORAL_TABLET | ORAL | 0 refills | Status: AC | PRN

## 2024-03-18 MED ORDER — POTASSIUM CHLORIDE CRYS ER 10 MEQ PO TBCR: 10.0000 meq | EXTENDED_RELEASE_TABLET | ORAL | 0 refills | Status: AC | PRN

## 2024-03-18 NOTE — Discharge Summary (Signed)
 Physician Discharge Summary   Patient: David Brooks MRN: [id number] DOB: 1966/01/13  Admit date:     03/15/2024  Discharge date: 03/18/24  Discharge Physician: Deliliah Room   PCP: Pcp, No   Recommendations at discharge:    F/u with your PCP in one week. F/u with your cardiologist at [facility] in a month Heart Healthy diet Fluid restriction of 1.5 L per day Continue taking meds as prescribed  Discharge Diagnoses: Principal Problem:   Acute diastolic heart failure (HCC) Active Problems:   DM (diabetes mellitus) (HCC)   Essential hypertension, benign   Obstructive sleep apnea   Shortness of breath   Pulmonary edema    Hospital Course: 58 year old male with history of hypertension, OSA noncompliant with CPAP, obesity who presents to the ER with complaints of dyspnea on exertion, coughing. 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   In the ER, he was satting 87% on room air, blood pressure in 150s to 160s systolic.  Chest x-ray revealed moderately dense perihilar opacities with peripheral sparing, consistent airspace disease such as pneumonia, edema or inhalational injury.  Patient denies smoking or vaping.  Moderately elevated right hemidiaphragm, consistent with varices or eventration.  Patient also reports of increasing body weight recently. Admitted due to concern for congestive heart failure.  proBNP 412, troponin T 28 and flat, procalcitonin less than 0.10.  COVID-19, RSV, influenza A and B-.  Acute diastolic heart failure,POA: NYHA III -Significantly improved with IV diuresis. - Dced on oral hydrochlorothiazide 25 mg daily as well as Lasix 20 mg prn (fluid/edema) - Echocardiogram shows LVEF 55 to 60%, grade 1 diastolic dysfunction. -Cardiology consulted. Continue Farxiga and losartan - Mild troponin elevation likely from CHF. - Procalcitonin less than  0.10, unlikely bacterial infection.  Viral respiratory panel is negative.     Acute respiratory failure with hypoxia, resolved - Secondary to pulmonary edema - done on the day of the discharge and he was not requiring any supplemental oxygen.   Diabetes mellitus type 2,POA:    Hemoglobin A1c is 9.3.  Patient used to be on oral hypoglycemics but has not been taking for at least 2-3 months. Started glyburide 3 mg BID as well as Metformin XR 500 mg PO BID on discharge Prescription given for glucometer and supplies Discussed with diabetes coordinator as well Diabetic medication adjustments will need to be done by his PCP based on his blood glucose levels.   Hypertension -continue norvasc 10, losartan 100. Resumed hydrochlorothiazide 25 daily on dc   Chronic shoulder pain - continue cymbalta -prn percocet   Disposition: home       Consultants: Cardiology Procedures performed: None  Disposition: Home Diet recommendation:  Cardiac and Carb modified diet DISCHARGE MEDICATION: Allergies as of 03/18/2024       Reactions   Penicillins Anaphylaxis   Shrimp [shellfish Allergy] Anaphylaxis   Tramadol  Rash   Possible combination with Zantac        Medication List     STOP taking these medications    ALPRAZolam 1 MG tablet Commonly known as: XANAX   amLODipine-olmesartan 10-40 MG tablet Commonly known as: AZOR   ammonium lactate  12 % cream Commonly known as: AMLACTIN   atenolol 50 MG tablet Commonly known as: TENORMIN   benzonatate  100 MG capsule Commonly known as: TESSALON    chlorthalidone 25 MG tablet Commonly known as: HYGROTON   ciclopirox  8 %  solution Commonly known as: Penlac    glyBURIDE 5 MG tablet Commonly known as: DIABETA   ibuprofen  800 MG tablet Commonly known as: ADVIL    losartan-hydrochlorothiazide 100-25 MG tablet Commonly known as: HYZAAR   lovastatin 20 MG tablet Commonly known as: MEVACOR   metoprolol succinate 25 MG 24 hr  tablet Commonly known as: TOPROL-XL   Ozempic (0.25 or 0.5 MG/DOSE) 2 MG/3ML Sopn Generic drug: Semaglutide(0.25 or 0.5MG /DOS)   pantoprazole 40 MG tablet Commonly known as: PROTONIX   pioglitazone 45 MG tablet Commonly known as: ACTOS   sildenafil  20 MG tablet Commonly known as: REVATIO        TAKE these medications    amLODipine 10 MG tablet Commonly known as: NORVASC Take 1 tablet (10 mg total) by mouth daily.   aspirin EC 81 MG tablet Take 81 mg by mouth daily.   Atrovent HFA 17 MCG/ACT inhaler Generic drug: ipratropium Inhale 1 puff into the lungs every 6 (six) hours as needed for wheezing.   Blood Glucose Monitoring Suppl Devi 1 each by Does not apply route as directed. Dispense based on patient and insurance preference. Use up to four times daily as directed. (FOR ICD-10 E10.9, E11.9).   BLOOD GLUCOSE TEST STRIPS Strp 1 each by Does not apply route as directed. Dispense based on patient and insurance preference. Use up to four times daily as directed. (FOR ICD-10 E10.9, E11.9).   DULoxetine 60 MG capsule Commonly known as: CYMBALTA Take 60 mg by mouth daily.   Farxiga 10 MG Tabs tablet Generic drug: dapagliflozin propanediol Take 10 mg by mouth daily.   furosemide 20 MG tablet Commonly known as: Lasix Take 1 tablet (20 mg total) by mouth as needed for fluid or edema.   glyBURIDE micronized 1.5 MG tablet Commonly known as: GLYNASE Take 2 tablets (3 mg total) by mouth 2 (two) times daily with a meal.   hydrochlorothiazide 25 MG tablet Commonly known as: HYDRODIURIL Take 1 tablet (25 mg total) by mouth daily.   Lancet Device Misc 1 each by Does not apply route as directed. Dispense based on patient and insurance preference. Use up to four times daily as directed. (FOR ICD-10 E10.9, E11.9).   Lancets Misc 1 each by Does not apply route as directed. Dispense based on patient and insurance preference. Use up to four times daily as directed. (FOR ICD-10  E10.9, E11.9).   losartan 100 MG tablet Commonly known as: COZAAR Take 1 tablet (100 mg total) by mouth daily.   metFORMIN 500 MG 24 hr tablet Commonly known as: GLUCOPHAGE-XR Take 1 tablet (500 mg total) by mouth 2 (two) times daily with a meal. What changed: See the new instructions.   oxyCODONE -acetaminophen  10-325 MG tablet Commonly known as: PERCOCET Take 1 tablet by mouth every 4 (four) hours as needed for pain.   potassium chloride 10 MEQ tablet Commonly known as: KLOR-CON M Take 1 tablet (10 mEq total) by mouth as needed (only when you take lasix).   rosuvastatin 10 MG tablet Commonly known as: CRESTOR Take 10 mg by mouth daily.   tadalafil  20 MG tablet Commonly known as: CIALIS  Take 20 mg by mouth daily as needed for erectile dysfunction.   testosterone  cypionate 200 MG/ML injection Commonly known as: DEPOTESTOSTERONE CYPIONATE Inject 200 mg into the muscle every 14 (fourteen) days.        Discharge Exam: Filed Weights   03/16/24 0753 03/17/24 0420 03/18/24 0423  Weight: 133.5 kg 131.1 kg 130.4 kg   Constitutional: NAD,  calm, comfortable Eyes: PERRL, lids and conjunctivae normal ENMT: Mucous membranes are moist. Posterior pharynx clear of any exudate or lesions.Normal dentition.  Neck: normal, supple, no masses, no thyromegaly Respiratory: clear to auscultation bilaterally, no wheezing, no crackles. Normal respiratory effort. No accessory muscle use.  Cardiovascular: Regular rate and rhythm, no murmurs / rubs / gallops. No extremity edema. 2+ pedal pulses. No carotid bruits.  Abdomen: no tenderness, no masses palpated. No hepatosplenomegaly. Bowel sounds positive.  Musculoskeletal: no clubbing / cyanosis. No joint deformity upper and lower extremities. Good ROM, no contractures. Normal muscle tone.  Skin: no rashes, lesions, ulcers. No induration Neurologic: CN 2-12 grossly intact. Sensation intact, DTR normal. Strength 5/5 x all 4 extremities.  Psychiatric:  Normal judgment and insight. Alert and oriented x 3. Normal mood.    Condition at discharge: good  The results of significant diagnostics from this hospitalization (including imaging, microbiology, ancillary and laboratory) are listed below for reference.   Imaging Studies: DG CHEST PORT 1 VIEW Result Date: 03/17/2024 CLINICAL DATA:  Shortness of breath.  Status post extubation. EXAM: PORTABLE CHEST 1 VIEW COMPARISON:  03/15/2024 FINDINGS: Stable elevation of the right hemidiaphragm or large diaphragmatic eventration. Smaller left diaphragmatic eventration. Normal-sized heart. Small amount of patchy density in the right lung with significant improvement. Resolved patchy density in the left lung. Normal vascularity. No pleural fluid. Unremarkable bones. IMPRESSION: 1. Significantly improved right lung airspace opacity with a small amount of residual patchy pneumonia, alveolar edema or changes due to inhalational injury. 2. Resolved left lung alveolar edema or changes due to inhalational injury. Electronically Signed   By: Elspeth Bathe M.D.   On: 03/17/2024 15:21   ECHOCARDIOGRAM COMPLETE BUBBLE STUDY Result Date: 03/15/2024    ECHOCARDIOGRAM REPORT   Patient Name:   TAMEEM PULLARA Date of Exam: 03/15/2024 Medical Rec #:  [id number]       Height:       71.0 in Accession #:    [id number]      Weight:       308.0 lb Date of Birth:  10/30/1965       BSA:          2.533 m Patient Age:    58 years        BP:           168/96 mmHg Patient Gender: M               HR:           73 bpm. Exam Location:  [facility] Procedure: 2D Echo, Cardiac Doppler, Color Doppler and Saline Contrast Bubble            Study (Both Spectral and Color Flow Doppler were utilized during            procedure). Indications:    Acute Diastolic Heart Failure (HCC)  History:        Patient has no prior history of Echocardiogram examinations.                 Signs/Symptoms:Shortness of Breath, Dyspnea and Edema; Risk                  Factors:Hypertension, Sleep Apnea and Diabetes.  Sonographer:    Koleen Popper RDCS Referring Phys: [id number] BRENDA MORRISON IMPRESSIONS  1. Left ventricular ejection fraction, by estimation, is 55 to 60%. The left ventricle has normal function. The left ventricle has no regional wall motion abnormalities. There is mild concentric left ventricular  hypertrophy. Left ventricular diastolic parameters are consistent with Grade I diastolic dysfunction (impaired relaxation).  2. Right ventricular systolic function is normal. The right ventricular size is normal. There is normal pulmonary artery systolic pressure. The estimated right ventricular systolic pressure is 32.2 mmHg.  3. Left atrial size was moderately dilated.  4. The mitral valve is normal in structure. No evidence of mitral valve regurgitation. No evidence of mitral stenosis.  5. The aortic valve is tricuspid. Aortic valve regurgitation is mild. No aortic stenosis is present.  6. Agitated saline contrast bubble study was negative, with no evidence of any interatrial shunt. FINDINGS  Left Ventricle: Left ventricular ejection fraction, by estimation, is 55 to 60%. The left ventricle has normal function. The left ventricle has no regional wall motion abnormalities. The left ventricular internal cavity size was normal in size. There is  mild concentric left ventricular hypertrophy. Left ventricular diastolic parameters are consistent with Grade I diastolic dysfunction (impaired relaxation). Right Ventricle: The right ventricular size is normal. No increase in right ventricular wall thickness. Right ventricular systolic function is normal. There is normal pulmonary artery systolic pressure. The tricuspid regurgitant velocity is 2.70 m/s, and  with an assumed right atrial pressure of 3 mmHg, the estimated right ventricular systolic pressure is 32.2 mmHg. Left Atrium: Left atrial size was moderately dilated. Right Atrium: Right atrial size was normal in size.  Pericardium: There is no evidence of pericardial effusion. Mitral Valve: The mitral valve is normal in structure. There is mild thickening of the anterior mitral valve leaflet(s). There is mild calcification of the anterior mitral valve leaflet(s). No evidence of mitral valve regurgitation. No evidence of mitral  valve stenosis. Tricuspid Valve: The tricuspid valve is normal in structure. Tricuspid valve regurgitation is trivial. Aortic Valve: The aortic valve is tricuspid. Aortic valve regurgitation is mild. Aortic regurgitation PHT measures 510 msec. No aortic stenosis is present. Pulmonic Valve: The pulmonic valve was grossly normal. Pulmonic valve regurgitation is not visualized. No evidence of pulmonic stenosis. Aorta: The aortic root is normal in size and structure. IAS/Shunts: The interatrial septum was not well visualized. Agitated saline contrast was given intravenously to evaluate for intracardiac shunting. Agitated saline contrast bubble study was negative, with no evidence of any interatrial shunt.  LEFT VENTRICLE PLAX 2D LVIDd:         4.60 cm   Diastology LVIDs:         3.50 cm   LV e' medial:    4.68 cm/s LV PW:         1.50 cm   LV E/e' medial:  14.4 LV IVS:        1.40 cm   LV e' lateral:   6.31 cm/s LVOT diam:     2.10 cm   LV E/e' lateral: 10.7 LV SV:         63 LV SV Index:   25 LVOT Area:     3.46 cm  IVC IVC diam: 2.30 cm LEFT ATRIUM             Index LA diam:        5.10 cm 2.01 cm/m LA Vol (A2C):   43.2 ml 17.05 ml/m LA Vol (A4C):   71.0 ml 28.03 ml/m LA Biplane Vol: 56.5 ml 22.30 ml/m  AORTIC VALVE LVOT Vmax:   102.00 cm/s LVOT Vmean:  66.400 cm/s LVOT VTI:    0.183 m AI PHT:      510 msec  AORTA Ao Root diam: 3.30  cm MITRAL VALVE               TRICUSPID VALVE MV Area (PHT): 3.60 cm    TR Peak grad:   29.2 mmHg MV Decel Time: 211 msec    TR Vmax:        270.00 cm/s MV E velocity: 67.30 cm/s MV A velocity: 76.60 cm/s  SHUNTS MV E/A ratio:  0.88        Systemic VTI:  0.18 m                             Systemic Diam: 2.10 cm Jerel Croitoru MD Electronically signed by Jerel Balding MD Signature Date/Time: 03/15/2024/4:10:37 PM    Final    DG Chest 2 View Result Date: 03/15/2024 EXAM: 2 VIEW(S) XRAY OF THE CHEST 03/15/2024 01:45:10 AM COMPARISON: None available. CLINICAL HISTORY: eval for cough FINDINGS: LUNGS AND PLEURA: There are moderately dense perihilar alveolar opacities extending outward with peripheral sparing. There are trace pleural effusions but no large collection. No pneumothorax. There is a moderately elevated right hemidiaphragm consistent with paresis or eventration. HEART AND MEDIASTINUM: The heart is at the upper limit of normal in size. Central vessels are not well seen due to superimposed airspace disease. The mediastinum is normally outlined. BONES AND SOFT TISSUES: Thoracic cage intact with mild thoracic dextroscoliosis. IMPRESSION: 1. Moderately dense perihilar opacities with peripheral sparing, consistent with airspace disease such as due to pneumonia, edema, or inhalational injury such as due to vaping . 2. Trace pleural effusions. 3. Moderately elevated right hemidiaphragm, consistent with paresis or eventration. Electronically signed by: Francis Quam MD 03/15/2024 01:55 AM EST RP Workstation: [id number]    Microbiology: Results for orders placed or performed during the hospital encounter of 03/15/24  Respiratory (~20 pathogens) panel by PCR     Status: None   Collection Time: 03/15/24  4:20 AM   Specimen: Nasopharyngeal Swab; Respiratory  Result Value Ref Range Status   Adenovirus NOT DETECTED NOT DETECTED Final   Coronavirus 229E NOT DETECTED NOT DETECTED Final    Comment: (NOTE) The Coronavirus on the Respiratory Panel, DOES NOT test for the novel  Coronavirus (2019 nCoV)    Coronavirus HKU1 NOT DETECTED NOT DETECTED Final   Coronavirus NL63 NOT DETECTED NOT DETECTED Final   Coronavirus OC43 NOT DETECTED NOT DETECTED Final   Metapneumovirus NOT  DETECTED NOT DETECTED Final   Rhinovirus / Enterovirus NOT DETECTED NOT DETECTED Final   Influenza A NOT DETECTED NOT DETECTED Final   Influenza B NOT DETECTED NOT DETECTED Final   Parainfluenza Virus 1 NOT DETECTED NOT DETECTED Final   Parainfluenza Virus 2 NOT DETECTED NOT DETECTED Final   Parainfluenza Virus 3 NOT DETECTED NOT DETECTED Final   Parainfluenza Virus 4 NOT DETECTED NOT DETECTED Final   Respiratory Syncytial Virus NOT DETECTED NOT DETECTED Final   Bordetella pertussis NOT DETECTED NOT DETECTED Final   Bordetella Parapertussis NOT DETECTED NOT DETECTED Final   Chlamydophila pneumoniae NOT DETECTED NOT DETECTED Final   Mycoplasma pneumoniae NOT DETECTED NOT DETECTED Final    Comment: Performed at [facility], [address], [place], [place] [address]    Labs: CBC: Recent Labs  Lab 03/15/24 0233 03/15/24 0553  WBC 9.3 10.2  NEUTROABS 7.2  --   HGB 14.7 14.6  HCT 43.0 43.1  MCV 84.8 86.0  PLT 223 220   Basic Metabolic Panel: Recent Labs  Lab 03/15/24 0233 03/15/24  [id number] 03/15/24 0807 03/16/24 0458 03/17/24 0502 03/18/24 0434  NA 134*  --   --  137 135 136  K 3.5  --  3.2* 3.0* 3.3* 3.5  CL 97*  --   --  99 97* 96*  CO2 25  --   --  27 26 27   GLUCOSE 271*  --   --  144* 141* 134*  BUN 14  --   --  14 17 20   CREATININE 1.19 1.29*  --  1.22 1.25* 1.39*  CALCIUM 8.4*  --   --  8.8* 8.8* 9.2  MG  --   --   --   --  1.9 2.2   Liver Function Tests: Recent Labs  Lab 03/15/24 0233  AST 32  ALT 16  ALKPHOS 46  BILITOT 0.4  PROT 6.7  ALBUMIN 3.7   CBG: Recent Labs  Lab 03/17/24 0743 03/17/24 1117 03/17/24 1649 03/17/24 2008 03/18/24 0724  GLUCAP 144* 204* 125* 209* 130*    Discharge time spent: 44 minutes.  Signed: Deliliah Room, MD [facility] 03/18/2024

## 2024-03-18 NOTE — Progress Notes (Signed)
 Mobility Specialist Progress Note:    03/18/24 0933  Mobility  Activity Ambulated with assistance  Level of Assistance Independent  Assistive Device None  Distance Ambulated (ft) 720 ft  Range of Motion/Exercises Active;All extremities  Activity Response Tolerated well  Mobility Referral Yes  Mobility visit 1 Mobility  Mobility Specialist Start Time (ACUTE ONLY) 0933  Mobility Specialist Stop Time (ACUTE ONLY) 0953  Mobility Specialist Time Calculation (min) (ACUTE ONLY) 20 min   Pt received sitting EOB, agreeable to mobility. Independently able to stand and ambulate with no AD. Tolerated well, SpO2 96% on RA and HR 110 at rest. SpO2 92-93% on RA and HR 111-114 during ambulation. Returned sitting EOB, all needs met.  [place] Mobility Specialist Please contact via Special Educational Needs Teacher or  Rehab office at [phone]

## 2024-03-18 NOTE — Progress Notes (Signed)
 Rounding Note   Patient Name: David Brooks Date of Encounter: 03/18/2024  [facility] Cardiologist: None  [facility] Dr. [name]   Subjective Denies any SOB, LE edema, chest pain. He off supplemental oxygen. Patient states he feel significantly better and back at baseline.   Scheduled Meds:  amLODipine  10 mg Oral Daily   aspirin EC  81 mg Oral Daily   dapagliflozin propanediol  10 mg Oral Daily   DULoxetine  60 mg Oral Daily   enoxaparin (LOVENOX) injection  40 mg Subcutaneous Q24H   insulin aspart  0-15 Units Subcutaneous TID AC & HS   losartan  100 mg Oral Daily   umeclidinium bromide  1 puff Inhalation Daily   Continuous Infusions:  PRN Meds: acetaminophen , oxyCODONE -acetaminophen    Vital Signs  Vitals:   03/17/24 0741 03/17/24 1406 03/17/24 2006 03/18/24 0423  BP:  128/72 137/80 125/62  Pulse:  (!) 102 100 92  Resp:  20 18 19   Temp:  98.7 F (37.1 C) 98.6 F (37 C) 98.2 F (36.8 C)  TempSrc:  Oral Oral Oral  SpO2: 97% 92% 96% 98%  Weight:    130.4 kg  Height:        Intake/Output Summary (Last 24 hours) at 03/18/2024 0944 Last data filed at 03/18/2024 0500 Gross per 24 hour  Intake 1200 ml  Output 1400 ml  Net -200 ml      03/18/2024    4:23 AM 03/17/2024    4:20 AM 03/16/2024    7:53 AM  Last 3 Weights  Weight (lbs) 287 lb 7.7 oz 289 lb 0.4 oz 294 lb 5 oz  Weight (kg) 130.4 kg 131.1 kg 133.5 kg      Telemetry NSR, HR 90-110's - Personally Reviewed   Physical Exam GEN: No acute distress.   Neck: No JVD Cardiac: RRR, no murmurs, rubs, or gallops.  Respiratory: Clear to auscultation bilaterally. GI: Soft, nontender, non-distended  MS: No edema; No deformity. Neuro:  Nonfocal  Psych: Normal affect   Labs High Sensitivity Troponin:  No results for input(s): TROPONINIHS in the last 720 hours.   Chemistry Recent Labs  Lab 03/15/24 0233 03/15/24 0553 03/16/24 0458 03/17/24 0502 03/18/24 0434  NA 134*  --  137  135 136  K 3.5   < > 3.0* 3.3* 3.5  CL 97*  --  99 97* 96*  CO2 25  --  27 26 27   GLUCOSE 271*  --  144* 141* 134*  BUN 14  --  14 17 20   CREATININE 1.19   < > 1.22 1.25* 1.39*  CALCIUM 8.4*  --  8.8* 8.8* 9.2  MG  --   --   --  1.9 2.2  PROT 6.7  --   --   --   --   ALBUMIN 3.7  --   --   --   --   AST 32  --   --   --   --   ALT 16  --   --   --   --   ALKPHOS 46  --   --   --   --   BILITOT 0.4  --   --   --   --   GFRNONAA >60   < > >60 >60 59*  ANIONGAP 12  --  12 12 13    < > = values in this interval not displayed.    Lipids No results for input(s): CHOL, TRIG,  HDL, LABVLDL, LDLCALC, CHOLHDL in the last 168 hours.  Hematology Recent Labs  Lab 03/15/24 0233 03/15/24 0553  WBC 9.3 10.2  RBC 5.07 5.01  HGB 14.7 14.6  HCT 43.0 43.1  MCV 84.8 86.0  MCH 29.0 29.1  MCHC 34.2 33.9  RDW 13.1 13.2  PLT 223 220   Thyroid No results for input(s): TSH, FREET4 in the last 168 hours.  BNP Recent Labs  Lab 03/15/24 0233  PROBNP 412.0*    DDimer No results for input(s): DDIMER in the last 168 hours.   Radiology  DG CHEST PORT 1 VIEW Result Date: 03/17/2024 CLINICAL DATA:  Shortness of breath.  Status post extubation. EXAM: PORTABLE CHEST 1 VIEW COMPARISON:  03/15/2024 FINDINGS: Stable elevation of the right hemidiaphragm or large diaphragmatic eventration. Smaller left diaphragmatic eventration. Normal-sized heart. Small amount of patchy density in the right lung with significant improvement. Resolved patchy density in the left lung. Normal vascularity. No pleural fluid. Unremarkable bones. IMPRESSION: 1. Significantly improved right lung airspace opacity with a small amount of residual patchy pneumonia, alveolar edema or changes due to inhalational injury. 2. Resolved left lung alveolar edema or changes due to inhalational injury. Electronically Signed   By: Elspeth Bathe M.D.   On: 03/17/2024 15:21    Cardiac Studies ECHO IMPRESSIONS 03/15/2024  1. Left  ventricular ejection fraction, by estimation, is 55 to 60%. The left ventricle has normal function. The left ventricle has no regional wall motion abnormalities. There is mild concentric left ventricular  hypertrophy. Left ventricular diastolic  parameters are consistent with Grade I diastolic dysfunction (impaired  relaxation).   2. Right ventricular systolic function is normal. The right ventricular  size is normal. There is normal pulmonary artery systolic pressure. The  estimated right ventricular systolic pressure is 32.2 mmHg.   3. Left atrial size was moderately dilated.   4. The mitral valve is normal in structure. No evidence of mitral valve regurgitation. No evidence of mitral stenosis.   5. The aortic valve is tricuspid. Aortic valve regurgitation is mild. No aortic stenosis is present.   6. Agitated saline contrast bubble study was negative, with no evidence of any interatrial shunt.   Patient Profile   58 y.o. male with hx of HTN, OSA (noncompliant with CPAP), DM2, GERD, and reportedly who is being seen 03/16/2024 for the evaluation of new acute CHF at the request of Dr. Mcarthur.   Assessment & Plan  NEW Acute HFpEF He follows at [facility] with history of coronary artery calcification, but no prior documented obstructive CAD.  Most recently started on Farxiga and also switched from Cozaar to Hyzaar. He was out of Hyzaar for about a week recently as well.  Presented with SOB with longer distance and increasing weight. Denies edema, orthopnea, or PND. Currently, symptoms have resolved and patient feel like he is back at baseline.  Pro BNP 412. Tn not consistent with ACS.  ECHO 03/15/2024 showed EF 55 to 60%, mild LVH, G1DD, moderately dilated LA, no significant valvular abnormalities, negative bubble study Initial CXR suggestive of trace pleural effusion. Repeat CXR yesterday showed significant improved right lung airspace opacity with residual patchy PNA, alveolar edema, or changes  due to inhalation and resolved left lung edema.   Net I/O: -4620, Wt 308 > 294 >287 lbs,  Cr 1.19 > 1.29 > 1.22> 1.25>1.39  Continue with strict I/O, daily standing weights if possible, and monitoring renal function.   Treated with IV lasix 40 mg x5. Currently IV  Lasix 40 mg BID. Appears Euvolemic on exam. Discontinue IV lasix.  Resume losartan-hydrochlorothiazide combo 100-25 mg with lasix PRN upon discharge.  Patient has not been drinking adequate fluid intake during hospitalization with taking IV lasix. Suspect contributing to mild elevation in Creatinine. Encourage increase to 50-60 oz of water daily with diuretics. Will still add hydrochlorothiazide 25 mg  Can consider low dose beta blocker since baseline HR 90-110. Can reassessed on ambulation results or defer to [facility] cardiology.  Continue on Farxiga 10 mg, losartan 100 mg daily  Encouraged low sodium diet, fluid restriction <2L, and daily weights.  Educated to contact our office for weight gain of 2 lbs overnight or 5 lbs in one week. ED precautions discussed.   Will need to follow up with primary cardiology at [facility] for stress testing, which was already previously arranged.     Hypokalemia  K 3 > 3.3 after multiple supplemental K.  This am K 3.5  after additional K supplement and Mg 1.9 >2.2 Continue to monitor   Acute respiratory failure with hypoxia  Patient is off supplemental oxygen. Not on O2 at home  Recommend hall ambulation with oxygen and symptom monitoring.    HTN BP initially elevated 160s to 170s/80s to 90s Most recent BP 152/84 Continue amlodipine 10 mg daily, Losartan 100 mg. He has not anti-HTN meditations yet this am. Continue to monitor BP after meds. .  Discontinued IV lasix.  Order Lasix 20 mg PRN  Order hydrochlorothiazide 25 mg    DM2 A1c 9.3 Managed by primary team     For questions or updates, please contact [facility] Please consult [email or web address] for contact info under        Signed, Lorette CINDERELLA Kapur, PA-C  03/18/2024, 9:44 AM

## 2024-04-15 ENCOUNTER — Encounter: Payer: Self-pay | Admitting: Orthopedic Surgery

## 2024-04-15 ENCOUNTER — Ambulatory Visit: Admitting: Orthopedic Surgery

## 2024-04-15 DIAGNOSIS — M25512 Pain in left shoulder: Secondary | ICD-10-CM

## 2024-04-15 DIAGNOSIS — G8929 Other chronic pain: Secondary | ICD-10-CM | POA: Diagnosis not present

## 2024-04-15 DIAGNOSIS — S46011D Strain of muscle(s) and tendon(s) of the rotator cuff of right shoulder, subsequent encounter: Secondary | ICD-10-CM

## 2024-04-15 DIAGNOSIS — M25511 Pain in right shoulder: Secondary | ICD-10-CM

## 2024-04-15 NOTE — Patient Instructions (Signed)

## 2024-04-15 NOTE — Progress Notes (Signed)
 Return patient Visit  Assessment: David Brooks is a 58 y.o. male with the following: 1. Chronic pain of both shoulders 2. Nontraumatic complete tear of right rotator cuff 3.  Full-thickness tear of the left rotator cuff   Plan: [name] [name] has chronic pain in bilateral shoulders.  He has full-thickness tears of bilateral rotator cuffs.  He would prefer to avoid surgery.  He is interested in repeat injections.  These were completed in clinic today.  He will follow-up as needed.  Procedure note injection Left shoulder    Verbal consent was obtained to inject the left shoulder, subacromial space Timeout was completed to confirm the site of injection.  The skin was prepped with alcohol and ethyl chloride was sprayed at the injection site.  A 21-gauge needle was used to inject 40 mg of Depo-Medrol  and 1% lidocaine (4 cc) into the subacromial space of the left shoulder using a posterolateral approach.  There were no complications. A sterile bandage was applied.   Procedure note injection - Right shoulder    Verbal consent was obtained to inject the right shoulder, subacromial space Timeout was completed to confirm the site of injection.   The skin was prepped with alcohol and ethyl chloride was sprayed at the injection site.  A 21-gauge needle was used to inject 40 mg of Depo-Medrol  and 1% lidocaine (4 cc) into the subacromial space of the right shoulder using a posterolateral approach.  There were no complications.  A sterile bandage was applied.    Follow-up: Return if symptoms worsen or fail to improve.  Subjective:  Chief Complaint  Patient presents with   Injections    Bilat shoulders. Pt has been discussing replacements but waiting on possible disability to do it due to costs.     History of Present Illness: David Brooks is a 58 y.o. male who returns for evaluation of bilateral shoulder pain.  He states both shoulders are bothering him equally.  The weather changes  recently have not been kind to his pain.  He remains active, working multiple jobs.  No recent or new injuries.  It has been several months since he has had injections.  He is interested in repeat injections today.   Review of Systems: No fevers or chills No numbness or tingling No chest pain No shortness of breath No bowel or bladder dysfunction No GI distress No headaches    Objective: There were no vitals taken for this visit.  Physical Exam:  General: Alert and oriented. and No acute distress. Gait: Normal gait.  Bilateral shoulders without deformity.  No point tenderness.  Good range of motion, with some discomfort.  Positive Jobes.  4+/5 strength in the supraspinatus and infraspinatus.  Negative belly press.  IMAGING: No new imaging obtained today   New Medications:  No orders of the defined types were placed in this encounter.     Oneil DELENA Horde, MD  04/15/2024 2:32 PM

## 2024-04-20 NOTE — Progress Notes (Deleted)
 "    CC: Microscopic blood in urine   HPI:    PMH: Past Medical History:  Diagnosis Date   Diabetes mellitus without complication (HCC)    Hypertension    Rotator cuff (capsule) sprain     Surgical History: No past surgical history on file.  Home Medications:  Allergies as of 04/21/2024       Reactions   Penicillins Anaphylaxis   Shrimp [shellfish Allergy] Anaphylaxis   Tramadol  Rash   Possible combination with Zantac        Medication List        Accurate as of April 20, 2024 11:53 AM. If you have any questions, ask your nurse or doctor.          amLODipine  10 MG tablet Commonly known as: NORVASC  Take 1 tablet (10 mg total) by mouth daily.   aspirin  EC 81 MG tablet Take 81 mg by mouth daily.   Atrovent HFA 17 MCG/ACT inhaler Generic drug: ipratropium Inhale 1 puff into the lungs every 6 (six) hours as needed for wheezing.   Blood Glucose Monitoring Suppl Devi 1 each by Does not apply route as directed. Dispense based on patient and insurance preference. Use up to four times daily as directed. (FOR ICD-10 E10.9, E11.9).   BLOOD GLUCOSE TEST STRIPS Strp 1 each by Does not apply route as directed. Dispense based on patient and insurance preference. Use up to four times daily as directed. (FOR ICD-10 E10.9, E11.9).   DULoxetine  60 MG capsule Commonly known as: CYMBALTA  Take 60 mg by mouth daily.   Farxiga  10 MG Tabs tablet Generic drug: dapagliflozin  propanediol Take 10 mg by mouth daily.   furosemide  20 MG tablet Commonly known as: Lasix  Take 1 tablet (20 mg total) by mouth as needed for fluid or edema.   glyBURIDE  micronized 1.5 MG tablet Commonly known as: GLYNASE  Take 2 tablets (3 mg total) by mouth 2 (two) times daily with a meal.   hydrochlorothiazide  25 MG tablet Commonly known as: HYDRODIURIL  Take 1 tablet (25 mg total) by mouth daily.   Lancet Device Misc 1 each by Does not apply route as directed. Dispense based on patient and  insurance preference. Use up to four times daily as directed. (FOR ICD-10 E10.9, E11.9).   Lancets Misc 1 each by Does not apply route as directed. Dispense based on patient and insurance preference. Use up to four times daily as directed. (FOR ICD-10 E10.9, E11.9).   losartan  100 MG tablet Commonly known as: COZAAR  Take 1 tablet (100 mg total) by mouth daily.   metFORMIN  500 MG 24 hr tablet Commonly known as: GLUCOPHAGE -XR Take 1 tablet (500 mg total) by mouth 2 (two) times daily with a meal.   oxyCODONE -acetaminophen  10-325 MG tablet Commonly known as: PERCOCET Take 1 tablet by mouth every 4 (four) hours as needed for pain.   potassium chloride  10 MEQ tablet Commonly known as: KLOR-CON  M Take 1 tablet (10 mEq total) by mouth as needed (only when you take lasix ).   rosuvastatin 10 MG tablet Commonly known as: CRESTOR Take 10 mg by mouth daily.   tadalafil  20 MG tablet Commonly known as: CIALIS  Take 20 mg by mouth daily as needed for erectile dysfunction.   testosterone  cypionate 200 MG/ML injection Commonly known as: DEPOTESTOSTERONE CYPIONATE Inject 200 mg into the muscle every 14 (fourteen) days.        Allergies: Allergies[1]  Family History: Family History  Problem Relation Age of Onset   Hypertension  Mother    Hyperlipidemia Mother    Cancer Father        throat    Social History:  reports that he has never smoked. He has never used smokeless tobacco. He reports that he does not drink alcohol and does not use drugs.  ROS: All other review of systems were reviewed and are negative except what is noted above in HPI  Physical Exam: There were no vitals taken for this visit.  Constitutional:  Alert and oriented, No acute distress. HEENT: [place] AT, moist mucus membranes.  Trachea midline, no masses. Cardiovascular: No clubbing, cyanosis, or edema. Respiratory: Normal respiratory effort, no increased work of breathing. GI: No inguinal hernias GU: Normal  phallus. No masses/lesions on penis, testis, scrotum. Prostate ***g smooth no nodules no induration.  Lymph: No cervical or inguinal lymphadenopathy. Skin: No rashes, bruises or suspicious lesions. Neurologic: Grossly intact, no focal deficits, moving all 4 extremities. Psychiatric: Normal mood and affect.  Laboratory Data: Lab Results  Component Value Date   WBC 10.2 03/15/2024   HGB 14.6 03/15/2024   HCT 43.1 03/15/2024   MCV 86.0 03/15/2024   PLT 220 03/15/2024    Lab Results  Component Value Date   CREATININE 1.39 (H) 03/18/2024    Lab Results  Component Value Date   PSA 0.25 10/22/2012    Lab Results  Component Value Date   TESTOSTERONE  383 10/22/2012    Lab Results  Component Value Date   HGBA1C 9.3 (H) 03/15/2024    Urinalysis   Pertinent Imaging: ***  Assessment:    Plan:    1. Microhematuria (Primary) ***   No follow-ups on file.  [name], MD  [facility] [place]      [1]  Allergies Allergen Reactions   Penicillins Anaphylaxis   Shrimp [Shellfish Allergy] Anaphylaxis   Tramadol  Rash    Possible combination with Zantac   "

## 2024-04-21 ENCOUNTER — Ambulatory Visit: Admitting: Urology

## 2024-04-21 DIAGNOSIS — R3129 Other microscopic hematuria: Secondary | ICD-10-CM
